# Patient Record
Sex: Male | Born: 1950 | Race: Black or African American | Hispanic: No | Marital: Married | Smoking: Never smoker
Health system: Southern US, Community
[De-identification: ages and names within clinical notes are randomized; demographics above are authoritative.]

## PROBLEM LIST (undated history)

## (undated) DIAGNOSIS — Z860101 Personal history of adenomatous and serrated colon polyps: Secondary | ICD-10-CM

## (undated) DIAGNOSIS — M1990 Unspecified osteoarthritis, unspecified site: Secondary | ICD-10-CM

## (undated) DIAGNOSIS — R0609 Other forms of dyspnea: Secondary | ICD-10-CM

## (undated) DIAGNOSIS — G4730 Sleep apnea, unspecified: Secondary | ICD-10-CM

## (undated) DIAGNOSIS — R42 Dizziness and giddiness: Secondary | ICD-10-CM

## (undated) DIAGNOSIS — Z9109 Other allergy status, other than to drugs and biological substances: Secondary | ICD-10-CM

## (undated) DIAGNOSIS — I10 Essential (primary) hypertension: Secondary | ICD-10-CM

## (undated) DIAGNOSIS — E785 Hyperlipidemia, unspecified: Secondary | ICD-10-CM

## (undated) DIAGNOSIS — Z973 Presence of spectacles and contact lenses: Secondary | ICD-10-CM

## (undated) DIAGNOSIS — N401 Enlarged prostate with lower urinary tract symptoms: Secondary | ICD-10-CM

## (undated) DIAGNOSIS — T148XXA Other injury of unspecified body region, initial encounter: Secondary | ICD-10-CM

## (undated) DIAGNOSIS — K219 Gastro-esophageal reflux disease without esophagitis: Secondary | ICD-10-CM

## (undated) DIAGNOSIS — G709 Myoneural disorder, unspecified: Secondary | ICD-10-CM

## (undated) DIAGNOSIS — R0602 Shortness of breath: Secondary | ICD-10-CM

## (undated) DIAGNOSIS — R81 Glycosuria: Secondary | ICD-10-CM

## (undated) DIAGNOSIS — R51 Headache: Secondary | ICD-10-CM

## (undated) DIAGNOSIS — N529 Male erectile dysfunction, unspecified: Secondary | ICD-10-CM

## (undated) DIAGNOSIS — Z8669 Personal history of other diseases of the nervous system and sense organs: Secondary | ICD-10-CM

## (undated) DIAGNOSIS — G4733 Obstructive sleep apnea (adult) (pediatric): Secondary | ICD-10-CM

## (undated) DIAGNOSIS — E119 Type 2 diabetes mellitus without complications: Secondary | ICD-10-CM

## (undated) DIAGNOSIS — G589 Mononeuropathy, unspecified: Secondary | ICD-10-CM

## (undated) DIAGNOSIS — Z7901 Long term (current) use of anticoagulants: Secondary | ICD-10-CM

## (undated) DIAGNOSIS — I2510 Atherosclerotic heart disease of native coronary artery without angina pectoris: Secondary | ICD-10-CM

## (undated) HISTORY — PX: APPENDECTOMY: SHX54

## (undated) HISTORY — PX: TONSILLECTOMY: SUR1361

## (undated) HISTORY — PX: ULNAR NERVE TRANSPOSITION: SHX2595

## (undated) HISTORY — PX: INGUINAL HERNIA REPAIR: SUR1180

## (undated) HISTORY — PX: KNEE ARTHROSCOPY: SUR90

## (undated) HISTORY — PX: CARDIAC CATHETERIZATION: SHX172

## (undated) HISTORY — PX: CARPAL TUNNEL RELEASE: SHX101

## (undated) HISTORY — PX: ELBOW ARTHROSCOPY: SUR87

## (undated) HISTORY — PX: SHOULDER ARTHROSCOPY: SHX128

## (undated) HISTORY — PX: COLONOSCOPY W/ POLYPECTOMY: SHX1380

---
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
## 1993-04-16 HISTORY — PX: CORONARY ANGIOPLASTY WITH STENT PLACEMENT: SHX49

## 1994-04-16 HISTORY — PX: CORONARY ANGIOPLASTY WITH STENT PLACEMENT: SHX49

## 1998-05-19 ENCOUNTER — Ambulatory Visit (HOSPITAL_COMMUNITY): Admission: RE | Admit: 1998-05-19 | Discharge: 1998-05-19 | Payer: Self-pay | Admitting: *Deleted

## 1998-05-19 ENCOUNTER — Encounter: Payer: Self-pay | Admitting: *Deleted

## 2000-04-16 HISTORY — PX: ULNAR NERVE TRANSPOSITION: SHX2595

## 2002-12-23 ENCOUNTER — Ambulatory Visit (HOSPITAL_COMMUNITY): Admission: RE | Admit: 2002-12-23 | Discharge: 2002-12-23 | Payer: Self-pay | Admitting: Orthopedic Surgery

## 2002-12-23 ENCOUNTER — Encounter: Payer: Self-pay | Admitting: Orthopedic Surgery

## 2003-02-15 ENCOUNTER — Ambulatory Visit (HOSPITAL_COMMUNITY): Admission: RE | Admit: 2003-02-15 | Discharge: 2003-02-15 | Payer: Self-pay | Admitting: Orthopedic Surgery

## 2003-04-17 HISTORY — PX: CORONARY ANGIOPLASTY WITH STENT PLACEMENT: SHX49

## 2003-05-27 ENCOUNTER — Inpatient Hospital Stay (HOSPITAL_COMMUNITY): Admission: AD | Admit: 2003-05-27 | Discharge: 2003-05-29 | Payer: Self-pay | Admitting: *Deleted

## 2004-05-12 ENCOUNTER — Ambulatory Visit: Payer: Self-pay | Admitting: Family Medicine

## 2004-10-05 ENCOUNTER — Ambulatory Visit: Payer: Self-pay | Admitting: Family Medicine

## 2005-01-04 ENCOUNTER — Ambulatory Visit: Payer: Self-pay | Admitting: Family Medicine

## 2005-02-01 ENCOUNTER — Ambulatory Visit: Payer: Self-pay | Admitting: Family Medicine

## 2005-06-04 ENCOUNTER — Ambulatory Visit: Payer: Self-pay | Admitting: Family Medicine

## 2013-03-26 DIAGNOSIS — M1711 Unilateral primary osteoarthritis, right knee: Secondary | ICD-10-CM

## 2013-03-26 HISTORY — DX: Unilateral primary osteoarthritis, right knee: M17.11

## 2013-04-27 ENCOUNTER — Encounter (HOSPITAL_COMMUNITY): Payer: Self-pay | Admitting: Pharmacy Technician

## 2013-04-27 ENCOUNTER — Other Ambulatory Visit: Payer: Self-pay | Admitting: Orthopedic Surgery

## 2013-04-30 NOTE — Pre-Procedure Instructions (Signed)
Clarence Schneider  04/30/2013   Your procedure is scheduled on:  Monday May 11, 2013 at 10:00 AM.  Report to [facility] Short Stay Entrance "A" Admitting at 7:00 AM.  Call this number if you have problems the morning of surgery: [phone]   Remember:   Do not eat food or drink liquids after midnight.   Take these medicines the morning of surgery with A SIP OF WATER: Amlodipine (Norvasc), Carvedilol (Coreg), Gabapentin (Neurontin)   Do not wear jewelry.  Do not wear lotions, powders, or colognes. You may wear deodorant.  Men may shave face and neck.  Do not bring valuables to the hospital.  [facility] is not responsible for any belongings or valuables.               Contacts, dentures or bridgework may not be worn into surgery.  Leave suitcase in the car. After surgery it may be brought to your room.  For patients admitted to the hospital, discharge time is determined by your teatment team.               Patients discharged the day of surgery will not be allowed to drive home.  Name and phone number of your driver: Family/Friend  Special Instructions: Shower using CHG soap the night before your surgery and the morning of your surgery   Please read over the following fact sheets that you were given: Pain Booklet, Coughing and Deep Breathing, Blood Transfusion Information, Total Joint Packet, MRSA Information and Surgical Site Infection Prevention

## 2013-05-01 ENCOUNTER — Encounter (HOSPITAL_COMMUNITY): Payer: Self-pay

## 2013-05-01 ENCOUNTER — Encounter (HOSPITAL_COMMUNITY)
Admission: RE | Admit: 2013-05-01 | Discharge: 2013-05-01 | Disposition: A | Discharge status: Home / Self Care | Payer: BC Managed Care – PPO | Source: Ambulatory Visit | Attending: Orthopedic Surgery | Admitting: Orthopedic Surgery

## 2013-05-01 DIAGNOSIS — Z01812 Encounter for preprocedural laboratory examination: Secondary | ICD-10-CM | POA: Insufficient documentation

## 2013-05-01 DIAGNOSIS — Z01818 Encounter for other preprocedural examination: Secondary | ICD-10-CM | POA: Insufficient documentation

## 2013-05-01 DIAGNOSIS — Z01810 Encounter for preprocedural cardiovascular examination: Secondary | ICD-10-CM | POA: Insufficient documentation

## 2013-05-01 HISTORY — DX: Shortness of breath: R06.02

## 2013-05-01 HISTORY — DX: Dizziness and giddiness: R42

## 2013-05-01 HISTORY — DX: Headache: R51

## 2013-05-01 HISTORY — DX: Mononeuropathy, unspecified: G58.9

## 2013-05-01 HISTORY — DX: Gastro-esophageal reflux disease without esophagitis: K21.9

## 2013-05-01 HISTORY — DX: Essential (primary) hypertension: I10

## 2013-05-01 HISTORY — DX: Myoneural disorder, unspecified: G70.9

## 2013-05-01 HISTORY — DX: Other injury of unspecified body region, initial encounter: T14.8XXA

## 2013-05-01 HISTORY — DX: Unspecified osteoarthritis, unspecified site: M19.90

## 2013-05-01 HISTORY — DX: Atherosclerotic heart disease of native coronary artery without angina pectoris: I25.10

## 2013-05-01 HISTORY — DX: Hyperlipidemia, unspecified: E78.5

## 2013-05-01 LAB — CBC WITH DIFFERENTIAL/PLATELET
Basophils Absolute: 0 10*3/uL (ref 0.0–0.1)
Basophils Relative: 1 % (ref 0–1)
EOS PCT: 2 % (ref 0–5)
Eosinophils Absolute: 0.1 10*3/uL (ref 0.0–0.7)
HEMATOCRIT: 53.2 % — AB (ref 39.0–52.0)
Hemoglobin: 18.3 g/dL — ABNORMAL HIGH (ref 13.0–17.0)
LYMPHS ABS: 1.5 10*3/uL (ref 0.7–4.0)
Lymphocytes Relative: 25 % (ref 12–46)
MCH: 31.3 pg (ref 26.0–34.0)
MCHC: 34.4 g/dL (ref 30.0–36.0)
MCV: 90.9 fL (ref 78.0–100.0)
MONO ABS: 0.6 10*3/uL (ref 0.1–1.0)
Monocytes Relative: 9 % (ref 3–12)
Neutro Abs: 3.9 10*3/uL (ref 1.7–7.7)
Neutrophils Relative %: 64 % (ref 43–77)
Platelets: 148 10*3/uL — ABNORMAL LOW (ref 150–400)
RBC: 5.85 MIL/uL — AB (ref 4.22–5.81)
RDW: 14.3 % (ref 11.5–15.5)
WBC: 6.2 10*3/uL (ref 4.0–10.5)

## 2013-05-01 LAB — COMPREHENSIVE METABOLIC PANEL
ALT: 53 U/L (ref 0–53)
AST: 38 U/L — ABNORMAL HIGH (ref 0–37)
Albumin: 3.6 g/dL (ref 3.5–5.2)
Alkaline Phosphatase: 80 U/L (ref 39–117)
BUN: 12 mg/dL (ref 6–23)
CALCIUM: 8.8 mg/dL (ref 8.4–10.5)
CO2: 25 meq/L (ref 19–32)
CREATININE: 1.14 mg/dL (ref 0.50–1.35)
Chloride: 104 mEq/L (ref 96–112)
GFR, EST AFRICAN AMERICAN: 78 mL/min — AB (ref >90)
GFR, EST NON AFRICAN AMERICAN: 68 mL/min — AB (ref >90)
Glucose, Bld: 89 mg/dL (ref 70–99)
Potassium: 4.9 mEq/L (ref 3.7–5.3)
SODIUM: 141 meq/L (ref 137–147)
Total Bilirubin: 0.4 mg/dL (ref 0.3–1.2)
Total Protein: 6.8 g/dL (ref 6.0–8.3)

## 2013-05-01 LAB — URINALYSIS, ROUTINE W REFLEX MICROSCOPIC
Bilirubin Urine: NEGATIVE
GLUCOSE, UA: NEGATIVE mg/dL
HGB URINE DIPSTICK: NEGATIVE
Ketones, ur: NEGATIVE mg/dL
LEUKOCYTES UA: NEGATIVE
Nitrite: NEGATIVE
PROTEIN: NEGATIVE mg/dL
Specific Gravity, Urine: 1.043 — ABNORMAL HIGH (ref 1.005–1.030)
Urobilinogen, UA: 0.2 mg/dL (ref 0.0–1.0)
pH: 5 (ref 5.0–8.0)

## 2013-05-01 LAB — SURGICAL PCR SCREEN
MRSA, PCR: NEGATIVE
Staphylococcus aureus: NEGATIVE

## 2013-05-01 LAB — TYPE AND SCREEN
ABO/RH(D): B POS
ANTIBODY SCREEN: NEGATIVE

## 2013-05-01 LAB — APTT: aPTT: 26 seconds (ref 24–37)

## 2013-05-01 LAB — PROTIME-INR
INR: 0.98 (ref 0.00–1.49)
Prothrombin Time: 12.8 seconds (ref 11.6–15.2)

## 2013-05-01 LAB — ABO/RH: ABO/RH(D): B POS

## 2013-05-01 NOTE — Progress Notes (Signed)
Patient informed Nurse that he had a stress test at [facility] in [place] last year. Will request records. Patient also had a cardiac cath once in 2005 where he received two stents and once in 2006 [facility]) where he had one stent placed. Patient denied any recent chest discomfort. PCP is Dr. Teressa Lower at [facility]. Patient has sleep apnea and is supposed to wear a CPAP machine at bedtime however due to sinusitis he has not been wearing his CPAP machine. Patient sounded congested during PAT appointment but he denied having a cold and stated "this is how I sound, I'm just stopped up." Patient denied having any shortness of breath. When asked about Plavix patient informed Nurse that he was told to stop his Plavix and aspirin on 05/04/13. Lastly, patient informed Nurse that he had some syncopal episodes in the past and stated he had to wear a heart monitor about 3 months ago, and that his doctor decreased the dosage on his Amlodipine to 2.5mg  daily. He reported being lightheaded about 3 days ago, but denied actually "passing out."  [place], [place] notified of patients history. Will request records and send chart to [facility] for review.

## 2013-05-02 LAB — URINE CULTURE: Colony Count: 10000

## 2013-05-04 ENCOUNTER — Encounter (HOSPITAL_COMMUNITY): Payer: Self-pay

## 2013-05-04 NOTE — Progress Notes (Addendum)
Anesthesia Chart Review:  Patient is a 63 year old male scheduled for left TKA on 05/11/13 by Dr. Ronnie Derby.    History includes non-smoker, CAD s/p RCA stent 05/2003 [facility] by noted) and on 04/2004 [facility]), HTN, HLD, occasional lightheadedness, chronic DOE, neuromuscular disorder (not specified, but on Neurontin). PCP is Dr. Teressa Lower who is aware of plans for surgery. Cardiologist is Dr. Ninfa Linden [facility]) who cleared patient for this procedure.  Nuclear stress test on 02/19/13 showed no evidence of ischemia on this study, no diagnostic EKG changes of ischemia, EF 65%.  EKG on 05/01/13 showed NSR, non-specific ST/T wave abnormality.  EKG on 12/18/12 [facility]) showed SR, anterolateral ST elevation--repolarization variant, non-specific T wave abnormality.   Event monitor on 02/04/13 [facility]) showed frequent isolated PVC's, rare PAC's, no pauses or bradycardia.  There was no echo done within the past three years at [facility], [facility], [facility], or [place].  His last cardiac cath was on 04/28/04.  A Taxus stent was placed to the mid RCA for 70% stenosis.  There was 20% proximal RCA stenosis and 15% proximal DIAG1 stenosis. EF 50-55%.     CXR on 05/01/13 showed no active cardiopulmonary disease.  Preoperative labs noted. H/H 18.3/53.2 (previously 17.1/53.4 on 10/22/12) PLT 148K.  Comparison labs requested from PCP. Urine culture showed 10,000 colonies, none predominant.    He has been cleared by his cardiologist.  His PCP is also aware of plans for surgery.  If no acute changes then I would anticipate that he could proceed as planned.  George Hugh [facility] Short Stay Center/Anesthesiology Phone [phone] 05/05/2013 10:41 AM

## 2013-05-10 MED ORDER — CHLORHEXIDINE GLUCONATE 4 % EX LIQD: 60.0000 mL | Freq: Once | CUTANEOUS | Status: DC

## 2013-05-10 MED ORDER — CEFAZOLIN SODIUM-DEXTROSE 2-3 GM-% IV SOLR
2.0000 g | INTRAVENOUS | Status: AC
  Administered 2013-05-11: 2 g via INTRAVENOUS
  Filled 2013-05-10: qty 50

## 2013-05-10 MED ORDER — BUPIVACAINE LIPOSOME 1.3 % IJ SUSP
20.0000 mL | Freq: Once | INTRAMUSCULAR | Status: DC
  Filled 2013-05-10 (×2): qty 20

## 2013-05-10 MED ORDER — SODIUM CHLORIDE 0.9 % IV SOLN
1000.0000 mg | INTRAVENOUS | Status: DC
  Filled 2013-05-10: qty 10

## 2013-05-11 ENCOUNTER — Inpatient Hospital Stay (HOSPITAL_COMMUNITY)
Admission: RE | Admit: 2013-05-11 | Discharge: 2013-05-12 | DRG: 470 | Disposition: A | Discharge status: Home Health | Payer: BC Managed Care – PPO | Source: Ambulatory Visit | Attending: Orthopedic Surgery | Admitting: Orthopedic Surgery

## 2013-05-11 ENCOUNTER — Inpatient Hospital Stay (HOSPITAL_COMMUNITY): Payer: BC Managed Care – PPO | Admitting: Certified Registered"

## 2013-05-11 ENCOUNTER — Encounter (HOSPITAL_COMMUNITY): Payer: Self-pay | Admitting: *Deleted

## 2013-05-11 ENCOUNTER — Encounter (HOSPITAL_COMMUNITY)
Admission: RE | Disposition: A | Discharge status: Home Health | Payer: Self-pay | Source: Ambulatory Visit | Attending: Orthopedic Surgery

## 2013-05-11 ENCOUNTER — Encounter (HOSPITAL_COMMUNITY): Payer: BC Managed Care – PPO | Admitting: Vascular Surgery

## 2013-05-11 DIAGNOSIS — I2510 Atherosclerotic heart disease of native coronary artery without angina pectoris: Secondary | ICD-10-CM | POA: Diagnosis present

## 2013-05-11 DIAGNOSIS — E785 Hyperlipidemia, unspecified: Secondary | ICD-10-CM | POA: Diagnosis present

## 2013-05-11 DIAGNOSIS — Z9861 Coronary angioplasty status: Secondary | ICD-10-CM

## 2013-05-11 DIAGNOSIS — Z9089 Acquired absence of other organs: Secondary | ICD-10-CM

## 2013-05-11 DIAGNOSIS — D62 Acute posthemorrhagic anemia: Secondary | ICD-10-CM | POA: Diagnosis not present

## 2013-05-11 DIAGNOSIS — Z888 Allergy status to other drugs, medicaments and biological substances status: Secondary | ICD-10-CM

## 2013-05-11 DIAGNOSIS — M1710 Unilateral primary osteoarthritis, unspecified knee: Principal | ICD-10-CM | POA: Diagnosis present

## 2013-05-11 DIAGNOSIS — Z96659 Presence of unspecified artificial knee joint: Secondary | ICD-10-CM

## 2013-05-11 DIAGNOSIS — K219 Gastro-esophageal reflux disease without esophagitis: Secondary | ICD-10-CM | POA: Diagnosis present

## 2013-05-11 DIAGNOSIS — I10 Essential (primary) hypertension: Secondary | ICD-10-CM | POA: Diagnosis present

## 2013-05-11 HISTORY — DX: Presence of unspecified artificial knee joint: Z96.659

## 2013-05-11 HISTORY — PX: TOTAL KNEE ARTHROPLASTY: SHX125

## 2013-05-11 SURGERY — ARTHROPLASTY, KNEE, TOTAL
Anesthesia: Regional | Site: Knee | Laterality: Left

## 2013-05-11 MED ORDER — CEFAZOLIN SODIUM 1-5 GM-% IV SOLN
1.0000 g | Freq: Four times a day (QID) | INTRAVENOUS | Status: AC
  Administered 2013-05-11 (×2): 1 g via INTRAVENOUS
  Filled 2013-05-11 (×3): qty 50

## 2013-05-11 MED ORDER — DIPHENHYDRAMINE HCL 50 MG/ML IJ SOLN
INTRAMUSCULAR | Status: AC
  Filled 2013-05-11: qty 1

## 2013-05-11 MED ORDER — ARTIFICIAL TEARS OP OINT
TOPICAL_OINTMENT | OPHTHALMIC | Status: DC | PRN
  Administered 2013-05-11: 1 via OPHTHALMIC

## 2013-05-11 MED ORDER — PHENOL 1.4 % MT LIQD: 1.0000 | OROMUCOSAL | Status: DC | PRN

## 2013-05-11 MED ORDER — FENTANYL CITRATE 0.05 MG/ML IJ SOLN
25.0000 ug | INTRAMUSCULAR | Status: DC | PRN
Start: 2013-05-11 — End: 2013-05-11
  Administered 2013-05-11: 25 ug via INTRAVENOUS
  Administered 2013-05-11: 50 ug via INTRAVENOUS
  Administered 2013-05-11: 25 ug via INTRAVENOUS
  Administered 2013-05-11: 50 ug via INTRAVENOUS

## 2013-05-11 MED ORDER — MENTHOL 3 MG MT LOZG: 1.0000 | LOZENGE | OROMUCOSAL | Status: DC | PRN

## 2013-05-11 MED ORDER — HYDROCODONE-ACETAMINOPHEN 10-325 MG PO TABS
1.0000 | ORAL_TABLET | ORAL | Status: DC | PRN
  Administered 2013-05-11: 2 via ORAL
  Administered 2013-05-11: 1 via ORAL
  Administered 2013-05-12: 2 via ORAL
  Administered 2013-05-12 (×2): 1 via ORAL
  Filled 2013-05-11: qty 2
  Filled 2013-05-11: qty 1
  Filled 2013-05-11: qty 2
  Filled 2013-05-11 (×3): qty 1

## 2013-05-11 MED ORDER — FENTANYL CITRATE 0.05 MG/ML IJ SOLN
INTRAMUSCULAR | Status: AC
  Filled 2013-05-11: qty 2

## 2013-05-11 MED ORDER — ONDANSETRON HCL 4 MG/2ML IJ SOLN
INTRAMUSCULAR | Status: DC | PRN
Start: 2013-05-11 — End: 2013-05-11
  Administered 2013-05-11: 4 mg via INTRAVENOUS

## 2013-05-11 MED ORDER — DIPHENHYDRAMINE HCL 50 MG/ML IJ SOLN
12.5000 mg | Freq: Once | INTRAMUSCULAR | Status: AC
  Administered 2013-05-11: 12.5 mg via INTRAVENOUS

## 2013-05-11 MED ORDER — ONDANSETRON HCL 4 MG PO TABS: 4.0000 mg | ORAL_TABLET | Freq: Four times a day (QID) | ORAL | Status: DC | PRN

## 2013-05-11 MED ORDER — CLOTRIMAZOLE 1 % EX CREA
TOPICAL_CREAM | Freq: Two times a day (BID) | CUTANEOUS | Status: DC
  Filled 2013-05-11: qty 15

## 2013-05-11 MED ORDER — ASPIRIN EC 325 MG PO TBEC
325.0000 mg | DELAYED_RELEASE_TABLET | Freq: Every day | ORAL | Status: DC
  Administered 2013-05-11 – 2013-05-12 (×2): 325 mg via ORAL
  Filled 2013-05-11 (×3): qty 1

## 2013-05-11 MED ORDER — BUPIVACAINE LIPOSOME 1.3 % IJ SUSP
INTRAMUSCULAR | Status: DC | PRN
  Administered 2013-05-11: 20 mL

## 2013-05-11 MED ORDER — ACETAMINOPHEN 325 MG PO TABS
650.0000 mg | ORAL_TABLET | Freq: Four times a day (QID) | ORAL | Status: DC | PRN
Start: 2013-05-11 — End: 2013-05-12

## 2013-05-11 MED ORDER — PROPOFOL 10 MG/ML IV BOLUS
INTRAVENOUS | Status: DC | PRN
  Administered 2013-05-11: 50 mg via INTRAVENOUS
  Administered 2013-05-11: 150 mg via INTRAVENOUS

## 2013-05-11 MED ORDER — GLYCOPYRROLATE 0.2 MG/ML IJ SOLN
INTRAMUSCULAR | Status: DC | PRN
  Administered 2013-05-11: 0.6 mg via INTRAVENOUS

## 2013-05-11 MED ORDER — LIDOCAINE HCL (CARDIAC) 20 MG/ML IV SOLN
INTRAVENOUS | Status: AC
  Filled 2013-05-11: qty 5

## 2013-05-11 MED ORDER — CELECOXIB 200 MG PO CAPS
200.0000 mg | ORAL_CAPSULE | Freq: Two times a day (BID) | ORAL | Status: DC
  Administered 2013-05-11 – 2013-05-12 (×2): 200 mg via ORAL
  Filled 2013-05-11 (×4): qty 1

## 2013-05-11 MED ORDER — MIDAZOLAM HCL 2 MG/2ML IJ SOLN
2.0000 mg | Freq: Once | INTRAMUSCULAR | Status: AC
  Administered 2013-05-11: 2 mg via INTRAVENOUS
  Filled 2013-05-11: qty 2

## 2013-05-11 MED ORDER — PHENYLEPHRINE 40 MCG/ML (10ML) SYRINGE FOR IV PUSH (FOR BLOOD PRESSURE SUPPORT)
PREFILLED_SYRINGE | INTRAVENOUS | Status: AC
  Filled 2013-05-11: qty 10

## 2013-05-11 MED ORDER — ROCURONIUM BROMIDE 50 MG/5ML IV SOLN
INTRAVENOUS | Status: AC
  Filled 2013-05-11: qty 1

## 2013-05-11 MED ORDER — HYDROMORPHONE HCL PF 1 MG/ML IJ SOLN
1.0000 mg | INTRAMUSCULAR | Status: DC | PRN
  Administered 2013-05-11 (×3): 1 mg via INTRAVENOUS
  Filled 2013-05-11 (×2): qty 1

## 2013-05-11 MED ORDER — ONDANSETRON HCL 4 MG/2ML IJ SOLN: 4.0000 mg | Freq: Four times a day (QID) | INTRAMUSCULAR | Status: DC | PRN

## 2013-05-11 MED ORDER — ATORVASTATIN CALCIUM 20 MG PO TABS
20.0000 mg | ORAL_TABLET | Freq: Every day | ORAL | Status: DC
  Administered 2013-05-11: 20 mg via ORAL
  Filled 2013-05-11 (×2): qty 1

## 2013-05-11 MED ORDER — ARTIFICIAL TEARS OP OINT
TOPICAL_OINTMENT | OPHTHALMIC | Status: AC
  Filled 2013-05-11: qty 3.5

## 2013-05-11 MED ORDER — EZETIMIBE 10 MG PO TABS
10.0000 mg | ORAL_TABLET | Freq: Every day | ORAL | Status: DC
  Administered 2013-05-11 – 2013-05-12 (×2): 10 mg via ORAL
  Filled 2013-05-11 (×2): qty 1

## 2013-05-11 MED ORDER — OXYCODONE HCL ER 10 MG PO T12A
10.0000 mg | EXTENDED_RELEASE_TABLET | Freq: Two times a day (BID) | ORAL | Status: DC
  Administered 2013-05-11 – 2013-05-12 (×2): 10 mg via ORAL
  Filled 2013-05-11 (×2): qty 1

## 2013-05-11 MED ORDER — GABAPENTIN 400 MG PO CAPS
800.0000 mg | ORAL_CAPSULE | Freq: Two times a day (BID) | ORAL | Status: DC
Start: 2013-05-11 — End: 2013-05-12
  Administered 2013-05-11 – 2013-05-12 (×2): 800 mg via ORAL
  Filled 2013-05-11 (×3): qty 2

## 2013-05-11 MED ORDER — SODIUM CHLORIDE 0.9 % IR SOLN
Status: DC | PRN
  Administered 2013-05-11: 1000 mL

## 2013-05-11 MED ORDER — FLEET ENEMA 7-19 GM/118ML RE ENEM: 1.0000 | ENEMA | Freq: Once | RECTAL | Status: AC | PRN

## 2013-05-11 MED ORDER — HYDROMORPHONE HCL PF 1 MG/ML IJ SOLN
INTRAMUSCULAR | Status: AC
  Filled 2013-05-11: qty 1

## 2013-05-11 MED ORDER — METHOCARBAMOL 500 MG PO TABS
500.0000 mg | ORAL_TABLET | Freq: Four times a day (QID) | ORAL | Status: DC | PRN
  Administered 2013-05-11 – 2013-05-12 (×2): 500 mg via ORAL
  Filled 2013-05-11 (×2): qty 1

## 2013-05-11 MED ORDER — PHENYLEPHRINE HCL 10 MG/ML IJ SOLN
INTRAMUSCULAR | Status: DC | PRN
  Administered 2013-05-11: 80 ug via INTRAVENOUS
  Administered 2013-05-11: 40 ug via INTRAVENOUS
  Administered 2013-05-11 (×3): 80 ug via INTRAVENOUS

## 2013-05-11 MED ORDER — BUPIVACAINE-EPINEPHRINE (PF) 0.5% -1:200000 IJ SOLN
INTRAMUSCULAR | Status: AC
  Filled 2013-05-11: qty 10

## 2013-05-11 MED ORDER — MIDAZOLAM HCL 2 MG/2ML IJ SOLN
INTRAMUSCULAR | Status: AC
  Filled 2013-05-11: qty 2

## 2013-05-11 MED ORDER — AMLODIPINE BESYLATE 2.5 MG PO TABS
2.5000 mg | ORAL_TABLET | Freq: Every day | ORAL | Status: DC
Start: 2013-05-12 — End: 2013-05-12
  Administered 2013-05-12: 2.5 mg via ORAL
  Filled 2013-05-11: qty 1

## 2013-05-11 MED ORDER — FENTANYL CITRATE 0.05 MG/ML IJ SOLN
100.0000 ug | Freq: Once | INTRAMUSCULAR | Status: AC
  Administered 2013-05-11: 100 ug via INTRAVENOUS
  Filled 2013-05-11: qty 2

## 2013-05-11 MED ORDER — LIDOCAINE HCL (CARDIAC) 20 MG/ML IV SOLN
INTRAVENOUS | Status: DC | PRN
  Administered 2013-05-11: 30 mg via INTRAVENOUS

## 2013-05-11 MED ORDER — SUCCINYLCHOLINE CHLORIDE 20 MG/ML IJ SOLN
INTRAMUSCULAR | Status: DC | PRN
  Administered 2013-05-11: 30 mg via INTRAVENOUS

## 2013-05-11 MED ORDER — ZOLPIDEM TARTRATE 5 MG PO TABS: 5.0000 mg | ORAL_TABLET | Freq: Every evening | ORAL | Status: DC | PRN

## 2013-05-11 MED ORDER — GLYCOPYRROLATE 0.2 MG/ML IJ SOLN
INTRAMUSCULAR | Status: AC
  Filled 2013-05-11: qty 3

## 2013-05-11 MED ORDER — CLOPIDOGREL BISULFATE 75 MG PO TABS
75.0000 mg | ORAL_TABLET | Freq: Every day | ORAL | Status: DC
  Administered 2013-05-12: 75 mg via ORAL
  Filled 2013-05-11 (×2): qty 1

## 2013-05-11 MED ORDER — SENNOSIDES-DOCUSATE SODIUM 8.6-50 MG PO TABS: 1.0000 | ORAL_TABLET | Freq: Every evening | ORAL | Status: DC | PRN

## 2013-05-11 MED ORDER — ONDANSETRON HCL 4 MG/2ML IJ SOLN
INTRAMUSCULAR | Status: AC
  Filled 2013-05-11: qty 2

## 2013-05-11 MED ORDER — BISACODYL 5 MG PO TBEC: 5.0000 mg | DELAYED_RELEASE_TABLET | Freq: Every day | ORAL | Status: DC | PRN

## 2013-05-11 MED ORDER — METHOCARBAMOL 100 MG/ML IJ SOLN
500.0000 mg | Freq: Four times a day (QID) | INTRAVENOUS | Status: DC | PRN
  Filled 2013-05-11: qty 5

## 2013-05-11 MED ORDER — FENTANYL CITRATE 0.05 MG/ML IJ SOLN
INTRAMUSCULAR | Status: DC | PRN
  Administered 2013-05-11: 50 ug via INTRAVENOUS
  Administered 2013-05-11: 100 ug via INTRAVENOUS

## 2013-05-11 MED ORDER — LACTATED RINGERS IV SOLN
INTRAVENOUS | Status: DC | PRN
  Administered 2013-05-11 (×2): via INTRAVENOUS

## 2013-05-11 MED ORDER — DIPHENHYDRAMINE HCL 12.5 MG/5ML PO ELIX
12.5000 mg | ORAL_SOLUTION | ORAL | Status: DC | PRN
  Administered 2013-05-11 – 2013-05-12 (×2): 25 mg via ORAL
  Filled 2013-05-11 (×2): qty 10

## 2013-05-11 MED ORDER — ALUM & MAG HYDROXIDE-SIMETH 200-200-20 MG/5ML PO SUSP: 30.0000 mL | ORAL | Status: DC | PRN

## 2013-05-11 MED ORDER — LACTATED RINGERS IV SOLN
INTRAVENOUS | Status: DC
  Administered 2013-05-11: 08:00:00 via INTRAVENOUS

## 2013-05-11 MED ORDER — CARVEDILOL 12.5 MG PO TABS
12.5000 mg | ORAL_TABLET | Freq: Two times a day (BID) | ORAL | Status: DC
  Administered 2013-05-12 (×2): 12.5 mg via ORAL
  Filled 2013-05-11 (×4): qty 1

## 2013-05-11 MED ORDER — COLESEVELAM HCL 625 MG PO TABS
1250.0000 mg | ORAL_TABLET | Freq: Two times a day (BID) | ORAL | Status: DC
  Administered 2013-05-12 (×2): 1250 mg via ORAL
  Filled 2013-05-11 (×3): qty 2

## 2013-05-11 MED ORDER — ACETAMINOPHEN 650 MG RE SUPP
650.0000 mg | Freq: Four times a day (QID) | RECTAL | Status: DC | PRN
Start: 2013-05-11 — End: 2013-05-12

## 2013-05-11 MED ORDER — LOSARTAN POTASSIUM 50 MG PO TABS
100.0000 mg | ORAL_TABLET | Freq: Every day | ORAL | Status: DC
  Administered 2013-05-11 – 2013-05-12 (×2): 100 mg via ORAL
  Filled 2013-05-11 (×2): qty 2

## 2013-05-11 MED ORDER — TESTOSTERONE CYPIONATE 200 MG/ML IM SOLN: 200.0000 mg | INTRAMUSCULAR | Status: DC

## 2013-05-11 MED ORDER — ROCURONIUM BROMIDE 100 MG/10ML IV SOLN
INTRAVENOUS | Status: DC | PRN
  Administered 2013-05-11: 40 mg via INTRAVENOUS

## 2013-05-11 MED ORDER — FENTANYL CITRATE 0.05 MG/ML IJ SOLN
INTRAMUSCULAR | Status: AC
  Filled 2013-05-11: qty 5

## 2013-05-11 MED ORDER — SODIUM CHLORIDE 0.9 % IV SOLN
INTRAVENOUS | Status: DC
  Administered 2013-05-12: 75 mL/h via INTRAVENOUS

## 2013-05-11 MED ORDER — EPHEDRINE SULFATE 50 MG/ML IJ SOLN
INTRAMUSCULAR | Status: DC | PRN
  Administered 2013-05-11 (×4): 5 mg via INTRAVENOUS

## 2013-05-11 MED ORDER — NEOSTIGMINE METHYLSULFATE 1 MG/ML IJ SOLN
INTRAMUSCULAR | Status: DC | PRN
  Administered 2013-05-11: 5 mg via INTRAVENOUS

## 2013-05-11 MED ORDER — BUPIVACAINE-EPINEPHRINE 0.5% -1:200000 IJ SOLN
INTRAMUSCULAR | Status: DC | PRN
  Administered 2013-05-11: 30 mL

## 2013-05-11 MED ORDER — FENTANYL CITRATE 0.05 MG/ML IJ SOLN
INTRAMUSCULAR | Status: AC
Start: 2013-05-11 — End: 2013-05-12
  Filled 2013-05-11: qty 2

## 2013-05-11 MED ORDER — DOCUSATE SODIUM 100 MG PO CAPS
100.0000 mg | ORAL_CAPSULE | Freq: Two times a day (BID) | ORAL | Status: DC
Start: 2013-05-11 — End: 2013-05-12
  Administered 2013-05-11 – 2013-05-12 (×2): 100 mg via ORAL
  Filled 2013-05-11 (×4): qty 1

## 2013-05-11 MED ORDER — PROPOFOL 10 MG/ML IV BOLUS
INTRAVENOUS | Status: AC
  Filled 2013-05-11: qty 20

## 2013-05-11 SURGICAL SUPPLY — 55 items
BANDAGE ESMARK 6X9 LF (GAUZE/BANDAGES/DRESSINGS) ×1 IMPLANT
BLADE SAGITTAL 13X1.27X60 (BLADE) ×2 IMPLANT
BLADE SAGITTAL 13X1.27X60MM (BLADE) ×1
BLADE SAW SGTL 83.5X18.5 (BLADE) ×3 IMPLANT
BNDG CMPR 9X6 STRL LF SNTH (GAUZE/BANDAGES/DRESSINGS) ×1
BNDG ESMARK 6X9 LF (GAUZE/BANDAGES/DRESSINGS) ×3
BOWL SMART MIX CTS (DISPOSABLE) ×3 IMPLANT
CAP POR TM CP VIT E LN CER HD ×2 IMPLANT
CEMENT BONE SIMPLEX SPEEDSET (Cement) ×6 IMPLANT
COVER SURGICAL LIGHT HANDLE (MISCELLANEOUS) ×3 IMPLANT
CUFF TOURNIQUET SINGLE 34IN LL (TOURNIQUET CUFF) ×3 IMPLANT
DRAPE EXTREMITY T 121X128X90 (DRAPE) ×3 IMPLANT
DRAPE INCISE IOBAN 66X45 STRL (DRAPES) ×6 IMPLANT
DRAPE PROXIMA HALF (DRAPES) ×3 IMPLANT
DRAPE U-SHAPE 47X51 STRL (DRAPES) ×3 IMPLANT
DRSG ADAPTIC 3X8 NADH LF (GAUZE/BANDAGES/DRESSINGS) ×3 IMPLANT
DRSG PAD ABDOMINAL 8X10 ST (GAUZE/BANDAGES/DRESSINGS) ×3 IMPLANT
DURAPREP 26ML APPLICATOR (WOUND CARE) ×6 IMPLANT
ELECT REM PT RETURN 9FT ADLT (ELECTROSURGICAL) ×3
ELECTRODE REM PT RTRN 9FT ADLT (ELECTROSURGICAL) ×1 IMPLANT
EVACUATOR 1/8 PVC DRAIN (DRAIN) ×3 IMPLANT
GLOVE BIOGEL M 7.0 STRL (GLOVE) IMPLANT
GLOVE BIOGEL PI IND STRL 7.5 (GLOVE) IMPLANT
GLOVE BIOGEL PI IND STRL 8.5 (GLOVE) ×2 IMPLANT
GLOVE BIOGEL PI INDICATOR 7.5 (GLOVE)
GLOVE BIOGEL PI INDICATOR 8.5 (GLOVE) ×4
GLOVE SURG ORTHO 8.0 STRL STRW (GLOVE) ×6 IMPLANT
GOWN PREVENTION PLUS XLARGE (GOWN DISPOSABLE) ×6 IMPLANT
GOWN STRL NON-REIN LRG LVL3 (GOWN DISPOSABLE) ×6 IMPLANT
HANDPIECE INTERPULSE COAX TIP (DISPOSABLE) ×3
HOOD PEEL AWAY FACE SHEILD DIS (HOOD) ×12 IMPLANT
KIT BASIN OR (CUSTOM PROCEDURE TRAY) ×3 IMPLANT
KIT ROOM TURNOVER OR (KITS) ×3 IMPLANT
MANIFOLD NEPTUNE II (INSTRUMENTS) ×3 IMPLANT
NEEDLE 22X1 1/2 (OR ONLY) (NEEDLE) ×3 IMPLANT
NS IRRIG 1000ML POUR BTL (IV SOLUTION) ×3 IMPLANT
PACK TOTAL JOINT (CUSTOM PROCEDURE TRAY) ×3 IMPLANT
PAD ARMBOARD 7.5X6 YLW CONV (MISCELLANEOUS) ×6 IMPLANT
PADDING CAST COTTON 6X4 STRL (CAST SUPPLIES) ×3 IMPLANT
SET HNDPC FAN SPRY TIP SCT (DISPOSABLE) ×1 IMPLANT
SPONGE GAUZE 4X4 12PLY (GAUZE/BANDAGES/DRESSINGS) ×3 IMPLANT
SPONGE GAUZE 4X4 12PLY STER LF (GAUZE/BANDAGES/DRESSINGS) ×2 IMPLANT
STAPLER VISISTAT 35W (STAPLE) ×3 IMPLANT
SUCTION FRAZIER TIP 10 FR DISP (SUCTIONS) ×3 IMPLANT
SUT BONE WAX W31G (SUTURE) ×3 IMPLANT
SUT VIC AB 0 CTB1 27 (SUTURE) ×6 IMPLANT
SUT VIC AB 1 CT1 27 (SUTURE) ×6
SUT VIC AB 1 CT1 27XBRD ANBCTR (SUTURE) ×2 IMPLANT
SUT VIC AB 2-0 CT1 27 (SUTURE) ×6
SUT VIC AB 2-0 CT1 TAPERPNT 27 (SUTURE) ×2 IMPLANT
SYR CONTROL 10ML LL (SYRINGE) ×3 IMPLANT
TOWEL OR 17X24 6PK STRL BLUE (TOWEL DISPOSABLE) ×3 IMPLANT
TOWEL OR 17X26 10 PK STRL BLUE (TOWEL DISPOSABLE) ×3 IMPLANT
TRAY FOLEY CATH 14FR (SET/KITS/TRAYS/PACK) ×3 IMPLANT
WATER STERILE IRR 1000ML POUR (IV SOLUTION) ×6 IMPLANT

## 2013-05-11 NOTE — Transfer of Care (Signed)
Immediate Anesthesia Transfer of Care Note  Patient: Clarence Schneider  Procedure(s) Performed: Procedure(s): LEFT TOTAL KNEE ARTHROPLASTY (Left)  Patient Location: PACU  Anesthesia Type:General  Level of Consciousness: awake, alert  and oriented  Airway & Oxygen Therapy: Patient Spontanous Breathing and Patient connected to face mask oxygen  Post-op Assessment: Report given to PACU RN, Post -op Vital signs reviewed and stable and Patient moving all extremities X 4  Post vital signs: Reviewed and stable  Complications: No apparent anesthesia complications

## 2013-05-11 NOTE — Progress Notes (Signed)
Placed patient on CPAP per home settings at 6cm with oxygen set at 2lpm with Sp02 at this time of 97%.Will continue to monitor patient

## 2013-05-11 NOTE — Anesthesia Postprocedure Evaluation (Signed)
  Anesthesia Post-op Note  Patient: Clarence Schneider  Procedure(s) Performed: Procedure(s): LEFT TOTAL KNEE ARTHROPLASTY (Left)  Patient Location: PACU  Anesthesia Type:General and Regional  Level of Consciousness: awake, alert  and oriented  Airway and Oxygen Therapy: Patient Spontanous Breathing and Patient connected to nasal cannula oxygen  Post-op Pain: mild  Post-op Assessment: Post-op Vital signs reviewed, Patient's Cardiovascular Status Stable, Respiratory Function Stable, Patent Airway, No signs of Nausea or vomiting and Pain level controlled  Post-op Vital Signs: Reviewed and stable  Complications: No apparent anesthesia complications 

## 2013-05-11 NOTE — H&P (Signed)
Clarence Schneider MRN:  [id number] DOB/SEX:  1950-11-19/male  CHIEF COMPLAINT:  Painful left Knee  HISTORY: Patient is a 63 y.o. male presented with a history of pain in the left knee. Onset of symptoms was gradual starting several years ago with gradually worsening course since that time. Prior procedures on the knee include none. Patient has been treated conservatively with over-the-counter NSAIDs and activity modification. Patient currently rates pain in the knee at 9 out of 10 with activity. There is pain at night.  PAST MEDICAL HISTORY: There are no active problems to display for this patient.  Past Medical History  Diagnosis Date  . Esophageal reflux   . Hypertension   . Shortness of breath     with exertion  . Headache(784.0)   . Dizziness   . Lightheadedness     doesn't occur everyday; happens at times; once occured while walking up a incline  . Pinched nerve     in back  . Arthritis   . Neuromuscular disorder     Takes Gabapentin  . Hyperlipemia   . Skin abrasion     under both arms; uses Lotrisone cream twice a day  . Coronary artery disease    Past Surgical History  Procedure Laterality Date  . Tonsillectomy    . Appendectomy    . Inguinal hernia repair Right   . Carpal tunnel release Bilateral   . Shoulder arthroscopy Left   . Knee arthroscopy Bilateral     Left x 1 Right X 2  . Cardiac catheterization      X 3 stents  . Colonoscopy w/ polypectomy       MEDICATIONS:   Prescriptions prior to admission  Medication Sig Dispense Refill  . amLODipine (NORVASC) 5 MG tablet Take 2.5 mg by mouth daily.      Marland Kitchen aspirin 325 MG EC tablet Take 325 mg by mouth daily.      . carvedilol (COREG) 12.5 MG tablet Take 12.5 mg by mouth 2 (two) times daily with a meal.      . clopidogrel (PLAVIX) 75 MG tablet Take 75 mg by mouth daily with breakfast.      . clotrimazole-betamethasone (LOTRISONE) cream Apply 1 application topically 2 (two) times daily.      . colesevelam  (WELCHOL) 625 MG tablet Take 1,250 mg by mouth 2 (two) times daily with a meal.      . ezetimibe (ZETIA) 10 MG tablet Take 10 mg by mouth daily.      Marland Kitchen gabapentin (NEURONTIN) 400 MG capsule Take 800 mg by mouth 2 (two) times daily.      Marland Kitchen losartan (COZAAR) 100 MG tablet Take 100 mg by mouth daily.      . meloxicam (MOBIC) 15 MG tablet Take 15 mg by mouth daily.      . Multiple Vitamins-Minerals (MULTIVITAMIN WITH MINERALS) tablet Take 1 tablet by mouth daily.      Marland Kitchen omega-3 acid ethyl esters (LOVAZA) 1 G capsule Take 2 g by mouth 2 (two) times daily.      Marland Kitchen pyridoxine (B-6) 200 MG tablet Take 200 mg by mouth daily.      . rosuvastatin (CRESTOR) 10 MG tablet Take 15 mg by mouth daily.      . sildenafil (VIAGRA) 50 MG tablet Take 50 mg by mouth daily as needed for erectile dysfunction.      Marland Kitchen testosterone cypionate (DEPOTESTOTERONE CYPIONATE) 200 MG/ML injection Inject 200 mg into the muscle every 14 (fourteen) days.      Marland Kitchen  vitamin B-12 (CYANOCOBALAMIN) 1000 MCG tablet Take 1,000 mcg by mouth daily.      . vitamin C (ASCORBIC ACID) 500 MG tablet Take 500 mg by mouth daily.      . vitamin E 400 UNIT capsule Take 400 Units by mouth daily.        ALLERGIES:   Allergies  Allergen Reactions  . Oxycodone Itching  . Reglan [Metoclopramide]     Makes me act funny  . Tape Rash    "paper tape and Electrodes breaks me out"    REVIEW OF SYSTEMS:  Pertinent items are noted in HPI.   FAMILY HISTORY:  No family history on file.  SOCIAL HISTORY:   History  Substance Use Topics  . Smoking status: Never Smoker   . Smokeless tobacco: Not on file  . Alcohol Use: No     EXAMINATION:  Vital signs in last 24 hours:    General appearance: alert, cooperative and no distress Lungs: clear to auscultation bilaterally Heart: regular rate and rhythm, S1, S2 normal, no murmur, click, rub or gallop Abdomen: soft, non-tender; bowel sounds normal; no masses,  no organomegaly Extremities: extremities  normal, atraumatic, no cyanosis or edema and Homans sign is negative, no sign of DVT Pulses: 2+ and symmetric Skin: Skin color, texture, turgor normal. No rashes or lesions Neurologic: Alert and oriented X 3, normal strength and tone. Normal symmetric reflexes. Normal coordination and gait  Musculoskeletal:  ROM 0-115, Ligaments intact,  Imaging Review Plain radiographs demonstrate severe degenerative joint disease of the left knee. The overall alignment is mild valgus. The bone quality appears to be good for age and reported activity level.  Assessment/Plan: End stage arthritis, left knee   The patient history, physical examination and imaging studies are consistent with advanced degenerative joint disease of the left knee. The patient has failed conservative treatment.  The clearance notes were reviewed.  After discussion with the patient it was felt that Total Knee Replacement was indicated. The procedure,  risks, and benefits of total knee arthroplasty were presented and reviewed. The risks including but not limited to aseptic loosening, infection, blood clots, vascular injury, stiffness, patella tracking problems complications among others were discussed. The patient acknowledged the explanation, agreed to proceed with the plan.  Kristelle Cavallaro 05/11/2013, 7:12 AM

## 2013-05-11 NOTE — Progress Notes (Signed)
Orthopedic Tech Progress Note Patient Details:  Clarence Schneider Oct 10, 1950 [id number]  Patient ID: [name], male   DOB: 1951-01-22, 63 y.o.   MRN: [id number]  Placed pt's lle in cpm @ 0-90 degrees @1935  [name] 05/11/2013, 7:36 PM

## 2013-05-11 NOTE — Anesthesia Preprocedure Evaluation (Addendum)
Anesthesia Evaluation  Patient identified by MRN, date of birth, ID band Patient awake    Airway Mallampati: II TM Distance: >3 FB Neck ROM: Full    Dental  (+) Teeth Intact and Dental Advisory Given   Pulmonary shortness of breath,          Cardiovascular hypertension, + CAD and + Cardiac Stents     Neuro/Psych  Headaches,  Neuromuscular disease    GI/Hepatic GERD-  ,  Endo/Other    Renal/GU      Musculoskeletal   Abdominal   Peds  Hematology   Anesthesia Other Findings   Reproductive/Obstetrics                          Anesthesia Physical Anesthesia Plan  ASA: III  Anesthesia Plan:    Post-op Pain Management:    Induction:   Airway Management Planned:   Additional Equipment:   Intra-op Plan:   Post-operative Plan:   Informed Consent:   Plan Discussed with:   Anesthesia Plan Comments:         Anesthesia Quick Evaluation

## 2013-05-11 NOTE — Anesthesia Procedure Notes (Addendum)
Procedure Name: Intubation Date/Time: 05/11/2013 10:13 AM Performed by: Gaylene Brooks Pre-anesthesia Checklist: Patient identified, Timeout performed, Emergency Drugs available, Suction available and Patient being monitored Patient Re-evaluated:Patient Re-evaluated prior to inductionOxygen Delivery Method: Circle system utilized Preoxygenation: Pre-oxygenation with 100% oxygen Intubation Type: IV induction Ventilation: Oral airway inserted - appropriate to patient size and Two handed mask ventilation required Laryngoscope Size: Mac and 3 Grade View: Grade IV Tube type: Oral Tube size: 7.5 mm Number of attempts: 4 Airway Equipment and Method: Stylet and Bougie stylet Placement Confirmation: ETT inserted through vocal cords under direct vision,  breath sounds checked- equal and bilateral,  positive ETCO2 and CO2 detector Secured at: 23 cm Tube secured with: Tape Dental Injury: Bloody posterior oropharynx  Difficulty Due To: Difficulty was unanticipated, Difficult Airway- due to reduced neck mobility, Difficult Airway- due to large tongue and Difficult Airway- due to anterior larynx Future Recommendations: Recommend- awake intubation Comments: Started with Glidescope after difficulty with mask ventilation. Good view, yet unable to get ETT passed through cords. Attempts made by CRNA and Dr. Oletta Lamas. DL with Sabra Heck 2 by Dr Oletta Lamas. No view. DL with MAC 3 by Dr Tamala Julian. Able to pass ETT over bougie stylet. Sat remained 100% throughout intubation attempts. No dental damage.    Anesthesia Regional Block:  Adductor canal block  Pre-Anesthetic Checklist: ,, timeout performed, Correct Patient, Correct Site, Correct Laterality, Correct Procedure, Correct Position, site marked, Risks and benefits discussed,  Surgical consent,  Pre-op evaluation,  At surgeon's request and post-op pain management  Laterality: Left  Prep: chloraprep       Needles:  Injection technique: Single-shot  Needle Type:  Stimulator Needle - 80          Additional Needles:  Procedures: Doppler guided and nerve stimulator Adductor canal block Narrative:  Start time: 05/11/2013 9:15 AM End time: 05/11/2013 9:30 AM Injection made incrementally with aspirations every 5 mL.  Performed by: Personally  Anesthesiologist: Dr. Oletta Lamas

## 2013-05-11 NOTE — Op Note (Signed)
TOTAL KNEE REPLACEMENT OPERATIVE NOTE:  05/11/2013  4:11 PM  PATIENT:  Clarence Schneider  63 y.o. male  PRE-OPERATIVE DIAGNOSIS:  osteoarthritis left knee  POST-OPERATIVE DIAGNOSIS:  osteoarthritis left knee  PROCEDURE:  Procedure(s): LEFT TOTAL KNEE ARTHROPLASTY  SURGEON:  Surgeon(s): Vickey Huger, MD  PHYSICIAN ASSISTANT: Carlynn Spry, [facility]  ANESTHESIA:   general  DRAINS: Hemovac  SPECIMEN: None  COUNTS:  Correct  TOURNIQUET:   Total Tourniquet Time Documented: Thigh (Left) - 63 minutes Total: Thigh (Left) - 63 minutes   DICTATION:  Indication for procedure:    The patient is a 63 y.o. male who has failed conservative treatment for osteoarthritis left knee.  Informed consent was obtained prior to anesthesia. The risks versus benefits of the operation were explain and in a way the patient can, and did, understand.   On the implant demand matching protocol, this patient scored 10.  Therefore, this patient was not receive a polyethylene insert with vitamin E which is a high demand implant.  Description of procedure:     The patient was taken to the operating room and placed under anesthesia.  The patient was positioned in the usual fashion taking care that all body parts were adequately padded and/or protected.  I foley catheter was not placed.  A tourniquet was applied and the leg prepped and draped in the usual sterile fashion.  The extremity was exsanguinated with the esmarch and tourniquet inflated to 350 mmHg.  Pre-operative range of motion was normal.  The knee was in 5 degree of mild varus.  A midline incision approximately 6-7 inches long was made with a #10 blade.  A new blade was used to make a parapatellar arthrotomy going 2-3 cm into the quadriceps tendon, over the patella, and alongside the medial aspect of the patellar tendon.  A synovectomy was then performed with the #10 blade and forceps. I then elevated the deep MCL off the medial tibial metaphysis  subperiosteally around to the semimembranosus attachment.    I everted the patella and used calipers to measure patellar thickness.  I used the reamer to ream down to appropriate thickness to recreate the native thickness.  I then removed excess bone with the rongeur and sagittal saw.  I used the appropriately sized template and drilled the three lug holes.  I then put the trial in place and measured the thickness with the calipers to ensure recreation of the native thickness.  The trial was then removed and the patella subluxed and the knee brought into flexion.  A homan retractor was place to retract and protect the patella and lateral structures.  A Z-retractor was place medially to protect the medial structures.  The extra-medullary alignment system was used to make cut the tibial articular surface perpendicular to the anamotic axis of the tibia and in 3 degrees of posterior slope.  The cut surface and alignment jig was removed.  I then used the intramedullary alignment guide to make a 6 valgus cut on the distal femur.  I then marked out the epicondylar axis on the distal femur.  The posterior condylar axis measured 3 degrees.  I then used the anterior referencing sizer and measured the femur to be a size 11.  The 4-In-1 cutting block was screwed into place in external rotation matching the posterior condylar angle, making our cuts perpendicular to the epicondylar axis.  Anterior, posterior and chamfer cuts were made with the sagittal saw.  The cutting block and cut pieces were removed.  A lamina spreader was placed in 90 degrees of flexion.  The ACL, PCL, menisci, and posterior condylar osteophytes were removed.  A 14 mm spacer blocked was found to offer good flexion and extension gap balance after minimal in degree releasing.   The scoop retractor was then placed and the femoral finishing block was pinned in place.  The small sagittal saw was used as well as the lug drill to finish the femur.  The  block and cut surfaces were removed and the medullary canal hole filled with autograft bone from the cut pieces.  The tibia was delivered forward in deep flexion and external rotation.  A size G tray was selected and pinned into place centered on the medial 1/3 of the tibial tubercle.  The reamer and keel was used to prepare the tibia through the tray.    I then trialed with the size 11 femur, size G tibia, a 14 mm insert and the 38 patella.  I had excellent flexion/extension gap balance, excellent patella tracking.  Flexion was full and beyond 120 degrees; extension was zero.  These components were chosen and the staff opened them to me on the back table while the knee was lavaged copiously and the cement mixed.  The soft tissue was infiltrated with 60cc of exparel 1.3% through a 21 gauge needle.  I cemented in the components and removed all excess cement.  The polyethylene tibial component was snapped into place and the knee placed in extension while cement was hardening.  The capsule was infilltrated with 30cc of .25% Marcaine with epinephrine.  A hemovac was place in the joint exiting superolaterally.  A pain pump was place superomedially superficial to the arthrotomy.  Once the cement was hard, the tourniquet was let down.  Hemostasis was obtained.  The arthrotomy was closed with figure-8 #1 vicryl sutures.  The deep soft tissues were closed with #0 vicryls and the subcuticular layer closed with a running #2-0 vicryl.  The skin was reapproximated and closed with skin staples.  The wound was dressed with xeroform, 4 x4's, 2 ABD sponges, a single layer of webril and a TED stocking.   The patient was then awakened, extubated, and taken to the recovery room in stable condition.  BLOOD LOSS:  300cc DRAINS: 1 hemovac, 1 pain catheter COMPLICATIONS:  None.  PLAN OF CARE: Admit to inpatient   PATIENT DISPOSITION:  PACU - hemodynamically stable.   Delay start of Pharmacological VTE agent (>24hrs) due  to surgical blood loss or risk of bleeding:  not applicable  Please fax a copy of this op note to my office at [phone] (please only include page 1 and 2 of the Case Information op note)

## 2013-05-11 NOTE — Progress Notes (Signed)
Orthopedic Tech Progress Note Patient Details:  Clarence Schneider [birth year] [id number] CPM applied to Left LE with appropriate settings. Patient already had OHF on bed. Footsie roll provided.  CPM Left Knee CPM Left Knee: On Left Knee Flexion (Degrees): 90 Left Knee Extension (Degrees): 0   Asia R Thompson 05/11/2013, 1:40 PM

## 2013-05-12 ENCOUNTER — Encounter (HOSPITAL_COMMUNITY): Payer: Self-pay | Admitting: Orthopedic Surgery

## 2013-05-12 LAB — BASIC METABOLIC PANEL
BUN: 15 mg/dL (ref 6–23)
CHLORIDE: 102 meq/L (ref 96–112)
CO2: 26 meq/L (ref 19–32)
Calcium: 7.6 mg/dL — ABNORMAL LOW (ref 8.4–10.5)
Creatinine, Ser: 3.38 mg/dL — ABNORMAL HIGH (ref 0.50–1.35)
GFR calc Af Amer: 21 mL/min — ABNORMAL LOW (ref >90)
GFR calc non Af Amer: 18 mL/min — ABNORMAL LOW (ref >90)
GLUCOSE: 95 mg/dL (ref 70–99)
Potassium: 5.2 mEq/L (ref 3.7–5.3)
SODIUM: 139 meq/L (ref 137–147)

## 2013-05-12 LAB — CBC
HEMATOCRIT: 38.4 % — AB (ref 39.0–52.0)
HEMOGLOBIN: 13.1 g/dL (ref 13.0–17.0)
MCH: 31.3 pg (ref 26.0–34.0)
MCHC: 34.1 g/dL (ref 30.0–36.0)
MCV: 91.9 fL (ref 78.0–100.0)
Platelets: 148 10*3/uL — ABNORMAL LOW (ref 150–400)
RBC: 4.18 MIL/uL — AB (ref 4.22–5.81)
RDW: 15.1 % (ref 11.5–15.5)
WBC: 14.9 10*3/uL — AB (ref 4.0–10.5)

## 2013-05-12 MED ORDER — HYDROCODONE-ACETAMINOPHEN 10-325 MG PO TABS: 1.0000 | ORAL_TABLET | ORAL | Status: DC | PRN

## 2013-05-12 MED ORDER — METHOCARBAMOL 500 MG PO TABS: 500.0000 mg | ORAL_TABLET | Freq: Four times a day (QID) | ORAL | Status: DC | PRN

## 2013-05-12 NOTE — Progress Notes (Signed)
Utilization review completed.  

## 2013-05-12 NOTE — Evaluation (Signed)
Physical Therapy Evaluation Patient Details Name: Clarence Schneider MRN: [id number] DOB: 1951/02/28 Today's Date: 05/12/2013 Time: 0912-0946 PT Time Calculation (min): 34 min  PT Assessment / Plan / Recommendation History of Present Illness  Pt is a 63 y/o male admitted s/p L TKA  Clinical Impression  This patient presents with acute pain and decreased functional independence following the above mentioned procedure. At the time of PT eval, pt was able to perform transfers and ambulation with min assist to min guard for safety. This patient is appropriate for skilled PT interventions to address functional limitations, improve safety and independence with functional mobility, and return to PLOF. Anticipate he will progress well with physical therapy.      PT Assessment  Patient needs continued PT services    Follow Up Recommendations  Home health PT    Does the patient have the potential to tolerate intense rehabilitation      Barriers to Discharge        Equipment Recommendations  None recommended by PT    Recommendations for Other Services     Frequency 7X/week    Precautions / Restrictions Precautions Precautions: Fall;Knee Precaution Comments: Discussed towel roll under heel and NO pillow under knee.  Restrictions Weight Bearing Restrictions: Yes LLE Weight Bearing: Weight bearing as tolerated   Pertinent Vitals/Pain Pt reports 7/10 pain at rest, and 5/10 pain during ambulation.       Mobility  Bed Mobility Overal bed mobility: Needs Assistance Bed Mobility: Supine to Sit Supine to sit: Min assist General bed mobility comments: VC's for sequencing and technique. Assist for movement and support of L LE.  Transfers Overall transfer level: Needs assistance Equipment used: Rolling walker (2 wheeled) Transfers: Sit to/from Stand Sit to Stand: Min assist General transfer comment: VC's for hand placement on seated surface for safety. Assist to power up to full stand  initially.  Ambulation/Gait Ambulation/Gait assistance: Min guard Ambulation Distance (Feet): 75 Feet Assistive device: Rolling walker (2 wheeled) Gait Pattern/deviations: Step-to pattern;Step-through pattern;Decreased stride length;Trunk flexed Gait velocity: Decreased Gait velocity interpretation: Below normal speed for age/gender General Gait Details: VC's for sequencing and safety awareness with the RW. Specific cueing for increased heel strike, increased step-through gait pattern, and encouraged fluidity of walker movement.     Exercises Total Joint Exercises Ankle Circles/Pumps: 10 reps Quad Sets: 10 reps Heel Slides: 10 reps Goniometric ROM: 7-72   PT Diagnosis: Difficulty walking;Acute pain  PT Problem List: Decreased strength;Decreased range of motion;Decreased activity tolerance;Decreased balance;Decreased mobility;Decreased knowledge of use of DME;Decreased safety awareness;Pain PT Treatment Interventions: DME instruction;Gait training;Stair training;Functional mobility training;Therapeutic activities;Therapeutic exercise;Neuromuscular re-education;Patient/family education     PT Goals(Current goals can be found in the care plan section) Acute Rehab PT Goals Patient Stated Goal: To return home PT Goal Formulation: With patient/family Time For Goal Achievement: 05/26/13 Potential to Achieve Goals: Good  Visit Information  Last PT Received On: 05/12/13 History of Present Illness: Pt is a 63 y/o male admitted s/p L TKA       Prior [place] expects to be discharged to:: Private residence Living Arrangements: Spouse/significant other Available Help at Discharge: Family;Available 24 hours/day Type of Home: House Home Access: Stairs to enter [employer] of Steps: 1 Entrance Stairs-Rails: Right Home Layout: Two level Alternate Level Stairs-Number of Steps: 1 step to den Alternate Level Stairs-Rails: None (Doorway) Home  Equipment: Environmental consultant - 2 wheels;Bedside commode (Tub shower) Prior Function Level of Independence: Independent Communication Communication: No difficulties Dominant Hand:  Right    Cognition  Cognition Arousal/Alertness: Awake/alert Behavior During Therapy: WFL for tasks assessed/performed Overall Cognitive Status: Within Functional Limits for tasks assessed    Extremity/Trunk Assessment Upper Extremity Assessment Upper Extremity Assessment: Defer to OT evaluation Lower Extremity Assessment Lower Extremity Assessment: Generalized weakness;LLE deficits/detail LLE Deficits / Details: Decreased strength and AROM consistent with TKA LLE: Unable to fully assess due to pain Cervical / Trunk Assessment Cervical / Trunk Assessment: Normal   Balance Balance Overall balance assessment: No apparent balance deficits (not formally assessed)  End of Session PT - End of Session Equipment Utilized During Treatment: Gait belt Activity Tolerance: Patient tolerated treatment well Patient left: in chair;with call bell/phone within reach;with family/visitor present Nurse Communication: Mobility status CPM Left Knee CPM Left Knee: Off  GP     Jolyn Lent 05/12/2013, 12:04 PM  Jolyn Lent, PT, DPT [phone]

## 2013-05-12 NOTE — Progress Notes (Signed)
Orthopedic Tech Progress Note Patient Details:  Clarence Schneider 05/30/1950 [id number]  Patient ID: [name], male   DOB: [birth year], 63 y.o.   MRN: [id number] Placed pt's lle in cpm @ 0-70 degrees @ 1500  [name] 05/12/2013, 3:02 PM

## 2013-05-12 NOTE — Care Management Note (Signed)
CARE MANAGEMENT NOTE 05/12/2013  Patient:  BRYCEN, BEAN   Account Number:  [id number]  Date Initiated:  05/12/2013  Documentation initiated by:  Ricki Miller  Subjective/Objective Assessment:   63 yr old male s/p left total knee arthroplasty.     Action/Plan:   Case manager spoke with patient and wife concerning home health and DME needs at discharge. Patient preoperatively setup with [facility], no changes. They have rolling walker,3in1 from wife's surgery. CPM delivered.   Anticipated DC Date:  05/13/2013   Anticipated DC Plan:  [place]  CM consult      [facility] Choice  HOME HEALTH  DURABLE MEDICAL EQUIPMENT   Choice offered to / List presented to:  C-1 Patient   DME arranged  CPM      DME agency  [employer]     [place] arranged  HH-2 PT      [place]   Status of service:  Completed, signed off Medicare Important Message given?   (If response is "NO", the following Medicare IM given date fields will be blank) Date Medicare IM given:   Date Additional Medicare IM given:    Discharge Disposition:  [place]

## 2013-05-12 NOTE — Discharge Instructions (Signed)
Home Health Physical Therapy to be provided by [facility] [phone]  Diet: As you were doing prior to hospitalization   Activity:  Increase activity slowly as tolerated                  No lifting or driving for 6 weeks  Shower:  May shower without a dressing once there is no drainage from your wound.                 Do NOT wash over the wound.                 Dressing:  You may change your dressing on Wednesday                    Then change the dressing daily with sterile 4"x4"s gauze dressing                     And TED hose for knees.  Weight Bearing:  Weight bearing as tolerated as taught in physical therapy.  Use a                                walker or Crutches as instructed.  To prevent constipation: you may use a stool softener such as -               Colace ( over the counter) 100 mg by mouth twice a day                Drink plenty of fluids ( prune juice may be helpful) and high fiber foods                Miralax ( over the counter) for constipation as needed.    Precautions:  If you experience chest pain or shortness of breath - call 911 immediately               For transfer to the hospital emergency department!!               If you develop a fever greater that 101 F, purulent drainage from wound,                             increased redness or drainage from wound, or calf pain -- Call the office.  Follow- Up Appointment:  Please call for an appointment to be seen on 05/26/13                                              [facility] office:  [phone]            [address] [place], [place] [address]

## 2013-05-12 NOTE — Progress Notes (Signed)
Physical Therapy Treatment Patient Details Name: Clarence Schneider MRN: [id number] DOB: March 27, 1951 Today's Date: 05/12/2013 Time: 3810-1751 PT Time Calculation (min): 39 min  PT Assessment / Plan / Recommendation  History of Present Illness Pt is a 63 y/o male admitted s/p L TKA   PT Comments   Pt progressing towards physical therapy goals. Pt limited this session by lethargy, pt reports from pain medication he received prior to therapy session. Many instances of pt snoring with eyes open, falling asleep sitting up on EOB, and while static standing. Pt wanted to ambulate as much as possible to try and wake himself up. Pt's wife reports they were told he may d/c this afternoon, however pt would benefit from continued PT services prior to d/c for safety, as pt was not safe to attempt the steps this session.   Follow Up Recommendations  Home health PT     Does the patient have the potential to tolerate intense rehabilitation     Barriers to Discharge        Equipment Recommendations  None recommended by PT    Recommendations for Other Services    Frequency 7X/week   Progress towards PT Goals Progress towards PT goals: Progressing toward goals  Plan Current plan remains appropriate    Precautions / Restrictions Precautions Precautions: Fall;Knee Precaution Comments: Discussed towel roll under heel and NO pillow under knee.  Restrictions Weight Bearing Restrictions: Yes LLE Weight Bearing: Weight bearing as tolerated   Pertinent Vitals/Pain Pt reports minimal pain throughout session.     Mobility  Bed Mobility Overal bed mobility: Needs Assistance Bed Mobility: Supine to Sit;Sit to Supine Supine to sit: Min assist Sit to supine: Supervision General bed mobility comments: VC's for sequencing and technique. Assist for movement and support of L LE to transition to EOB. No physical assist required for sit>supine.  Transfers Overall transfer level: Needs assistance Equipment  used: Rolling walker (2 wheeled) Transfers: Sit to/from Stand Sit to Stand: Min guard General transfer comment: VC's for hand placement on seated surface for safety.  Ambulation/Gait Ambulation/Gait assistance: Min guard Ambulation Distance (Feet): 125 Feet Assistive device: Rolling walker (2 wheeled) Gait Pattern/deviations: Step-through pattern;Decreased stride length;Trunk flexed Gait velocity: Decreased Gait velocity interpretation: Below normal speed for age/gender General Gait Details: VC's for sequencing and safety awareness with the RW. Specific cueing for increased heel strike, increased step-through gait pattern, and encouraged fluidity of walker movement.     Exercises     PT Diagnosis:    PT Problem List:   PT Treatment Interventions:     PT Goals (current goals can now be found in the care plan section) Acute Rehab PT Goals Patient Stated Goal: To return home PT Goal Formulation: With patient/family Time For Goal Achievement: 05/26/13 Potential to Achieve Goals: Good  Visit Information  Last PT Received On: 05/12/13 History of Present Illness: Pt is a 63 y/o male admitted s/p L TKA    Subjective Data  Subjective: "I can't keep my eyes open." Patient Stated Goal: To return home   Cognition  Cognition Arousal/Alertness: Awake/alert Behavior During Therapy: WFL for tasks assessed/performed Overall Cognitive Status: Within Functional Limits for tasks assessed    Balance  Balance Overall balance assessment: No apparent balance deficits (not formally assessed)  End of Session PT - End of Session Equipment Utilized During Treatment: Gait belt Activity Tolerance: Patient limited by fatigue;Patient limited by lethargy Patient left: with call bell/phone within reach;with family/visitor present;in bed Nurse Communication: Mobility status  GP     Jolyn Lent 05/12/2013, 3:31 PM  Jolyn Lent, [place], DPT [phone]

## 2013-05-12 NOTE — Anesthesia Postprocedure Evaluation (Signed)
  Anesthesia Post-op Note  Patient: Clarence Schneider  Procedure(s) Performed: Procedure(s): LEFT TOTAL KNEE ARTHROPLASTY (Left)  Patient Location: PACU  Anesthesia Type:General and Regional  Level of Consciousness: awake, alert  and oriented  Airway and Oxygen Therapy: Patient Spontanous Breathing and Patient connected to nasal cannula oxygen  Post-op Pain: mild  Post-op Assessment: Post-op Vital signs reviewed, Patient's Cardiovascular Status Stable, Respiratory Function Stable, Patent Airway, No signs of Nausea or vomiting and Pain level controlled  Post-op Vital Signs: Reviewed and stable  Complications: No apparent anesthesia complications

## 2013-05-12 NOTE — Progress Notes (Signed)
Pt's O2  sats frequently down to 79-82% during the night when asleep and while on CPap and 3 liters oxygen.  Sats would come back up fairly quickly when pt woke up from pulse ox alarm.

## 2013-05-12 NOTE — Progress Notes (Signed)
SPORTS MEDICINE AND JOINT REPLACEMENT  Lara Mulch, MD   Carlynn Spry, PA-C [address], [place], [place]  [id number]                             [phone]   PROGRESS NOTE  Subjective:  negative for Chest Pain  negative for Shortness of Breath  negative for Nausea/Vomiting   negative for Calf Pain  negative for Bowel Movement   Tolerating Diet: yes         Patient reports pain as 4 on 0-10 scale.    Objective: Vital signs in last 24 hours:   Patient Vitals for the past 24 hrs:  BP Temp Temp src Pulse Resp SpO2  05/12/13 0627 132/80 mmHg 97.7 F (36.5 C) - 95 18 99 %  05/12/13 0450 135/76 mmHg 97.7 F (36.5 C) - 85 18 98 %  05/12/13 0044 137/75 mmHg 98.5 F (36.9 C) - 90 16 97 %  05/11/13 2258 - - - 84 20 99 %  05/11/13 2154 152/102 mmHg 97.9 F (36.6 C) - 75 18 98 %  05/11/13 1658 154/109 mmHg 97.7 F (36.5 C) Oral 77 16 95 %  05/11/13 1645 - - - 86 23 100 %  05/11/13 1637 - - - 79 12 100 %  05/11/13 1633 152/102 mmHg - - 84 22 100 %  05/11/13 1630 - 97.6 F (36.4 C) - 81 16 100 %  05/11/13 1618 143/127 mmHg - - 82 17 100 %  05/11/13 1615 - - - 79 13 99 %  05/11/13 1603 137/89 mmHg - - 74 16 100 %  05/11/13 1600 - - - 80 17 98 %  05/11/13 1545 - - - 74 10 100 %  05/11/13 1532 148/122 mmHg - - 74 14 100 %  05/11/13 1530 - - - 75 18 100 %  05/11/13 1518 150/92 mmHg - - 74 24 100 %  05/11/13 1515 - - - 83 16 100 %  05/11/13 1503 157/106 mmHg - - - 14 -  05/11/13 1500 - - - 63 10 100 %  05/11/13 1448 144/106 mmHg - - 61 11 99 %  05/11/13 1445 165/121 mmHg - - 61 5 100 %  05/11/13 1435 166/108 mmHg - - 65 13 98 %  05/11/13 1430 - 97.2 F (36.2 C) - 75 10 100 %  05/11/13 1418 153/101 mmHg - - 72 18 100 %  05/11/13 1415 - - - 62 9 93 %  05/11/13 1404 163/50 mmHg - - 69 10 100 %  05/11/13 1400 - - - 58 6 99 %  05/11/13 1347 155/107 mmHg - - - 16 -  05/11/13 1345 - - - 76 14 95 %  05/11/13 1333 138/96 mmHg - - - 6 -  05/11/13 1330 - - - 64 18 99 %   05/11/13 1325 - - - - 13 -  05/11/13 1318 161/104 mmHg - - 53 9 100 %  05/11/13 1315 - - - 54 13 100 %  05/11/13 1302 146/109 mmHg - - 80 13 99 %  05/11/13 1300 - - - - 7 99 %  05/11/13 1248 155/98 mmHg - - - 24 -  05/11/13 1245 - - - 65 13 100 %  05/11/13 1240 - - - 69 12 98 %  05/11/13 1233 149/107 mmHg 97.4 F (36.3 C) - 66 7 100 %  05/11/13 1232 - - -  92 6 97 %  05/11/13 0940 - - - 74 12 94 %  05/11/13 0939 135/101 mmHg - - 68 11 93 %  05/11/13 0938 - - - 57 11 92 %  05/11/13 0937 - - - 70 15 100 %  05/11/13 0936 - - - 66 9 100 %  05/11/13 0935 127/81 mmHg - - 62 10 100 %  05/11/13 0934 - - - 62 12 100 %  05/11/13 0930 121/69 mmHg - - 73 26 99 %  05/11/13 0929 - - - 70 9 98 %  05/11/13 0928 - - - 68 14 98 %  05/11/13 0927 - - - 66 10 100 %  05/11/13 0926 - - - 66 14 89 %  05/11/13 0925 138/69 mmHg - - 70 8 100 %  05/11/13 0924 - - - 71 9 98 %  05/11/13 0923 - - - 67 10 100 %  05/11/13 0922 - - - 71 17 100 %  05/11/13 0921 - - - 68 19 100 %  05/11/13 0920 130/89 mmHg - - 73 24 100 %  05/11/13 0919 - - - 70 16 100 %  05/11/13 0918 - - - 71 16 100 %  05/11/13 0917 - - - 68 11 98 %  05/11/13 0915 124/89 mmHg - - 69 12 100 %  05/11/13 0910 119/83 mmHg - - 69 10 100 %  05/11/13 0905 116/88 mmHg - - 62 11 100 %  05/11/13 0900 131/84 mmHg - - 64 13 100 %  05/11/13 0855 130/80 mmHg - - 67 17 100 %  05/11/13 0854 - - - 64 12 100 %  05/11/13 0853 130/80 mmHg - - - 12 -  05/11/13 0852 - - - 62 13 100 %  05/11/13 0851 - - - 68 21 100 %  05/11/13 0850 - - - 69 14 99 %  05/11/13 0849 - - - - 13 -  05/11/13 0842 - - - 72 19 99 %  05/11/13 0841 - - - 66 13 100 %  05/11/13 0840 145/93 mmHg - - 69 11 100 %  05/11/13 0835 134/85 mmHg - - 64 13 100 %  05/11/13 0830 130/86 mmHg - - 71 15 100 %  05/11/13 0825 129/81 mmHg - - 65 10 100 %  05/11/13 0820 126/83 mmHg - - 68 12 100 %  05/11/13 0815 123/80 mmHg - - 67 10 100 %  05/11/13 0811 114/71 mmHg - - 69 - 97 %  05/11/13 0810  114/71 mmHg - - 69 14 98 %  05/11/13 0755 139/88 mmHg 97.9 F (36.6 C) Oral 72 16 99 %    @flow {1959:LAST@   Intake/Output from previous day:   01/26 0701 - 01/27 0700 In: 3165 [P.O.:480; I.V.:2460] Out: 1500 [Urine:250; Drains:900]   Intake/Output this shift:       Intake/Output     01/26 0701 - 01/27 0700 01/27 0701 - 01/28 0700   P.O. 480    I.V. 2460    Other 225    Total Intake 3165     Urine 250    Drains 900    Other 350    Total Output 1500     Net +1665             LABORATORY DATA:  Recent Labs  05/12/13 0553  WBC 14.9*  HGB 13.1  HCT 38.4*  PLT 148*    Recent Labs  05/12/13 0553  NA 139  K  5.2  CL 102  CO2 26  BUN 15  CREATININE 3.38*  GLUCOSE 95  CALCIUM 7.6*   Lab Results  Component Value Date   INR 0.98 05/01/2013    Examination:  General appearance: alert, cooperative and no distress Extremities: extremities normal, atraumatic, no cyanosis or edema and Homans sign is negative, no sign of DVT  Wound Exam: clean, dry, intact   Drainage:  Moderate amount Serosanguinous exudate  Motor Exam: EHL and FHL Intact  Sensory Exam: Deep Peroneal normal   Assessment:    1 Day Post-Op  Procedure(s) (LRB): LEFT TOTAL KNEE ARTHROPLASTY (Left)  ADDITIONAL DIAGNOSIS:  Active Problems:   S/P total knee arthroplasty  Acute Blood Loss Anemia   Plan: Physical Therapy as ordered Weight Bearing as Tolerated (WBAT)  DVT Prophylaxis:  Aspirin and plavix  DISCHARGE PLAN: Home  DISCHARGE NEEDS: HHPT, CPM, Walker and 3-in-1 comode seat         Arye Weyenberg 05/12/2013, 7:40 AM

## 2013-05-19 NOTE — Discharge Summary (Signed)
SPORTS MEDICINE & JOINT REPLACEMENT   Lara Mulch, MD   Carlynn Spry, PA-C [address], [place], [place]  [address]                             [phone]  PATIENT ID: Clarence Schneider        MRN:  [id number]          DOB/AGE: 1950-08-26 / 63 y.o.    DISCHARGE SUMMARY  ADMISSION DATE:    05/11/2013 DISCHARGE DATE:   05/12/2013  ADMISSION DIAGNOSIS: osteoarthritis left knee    DISCHARGE DIAGNOSIS:  osteoarthritis left knee    ADDITIONAL DIAGNOSIS: Active Problems:   S/P total knee arthroplasty  Past Medical History  Diagnosis Date  . Esophageal reflux   . Hypertension   . Shortness of breath     with exertion  . Headache(784.0)   . Dizziness   . Lightheadedness     doesn't occur everyday; happens at times; once occured while walking up a incline  . Pinched nerve     in back  . Arthritis   . Neuromuscular disorder     Takes Gabapentin  . Hyperlipemia   . Skin abrasion     under both arms; uses Lotrisone cream twice a day  . Coronary artery disease     PROCEDURE: Procedure(s): LEFT TOTAL KNEE ARTHROPLASTY on 05/11/2013  CONSULTS:     HISTORY:  See H&P in chart  HOSPITAL COURSE:  Clarence Schneider is a 63 y.o. admitted on 05/11/2013 and found to have a diagnosis of osteoarthritis left knee.  After appropriate laboratory studies were obtained  they were taken to the operating room on 05/11/2013 and underwent Procedure(s): LEFT TOTAL KNEE ARTHROPLASTY.   They were given perioperative antibiotics:  Anti-infectives   Start     Dose/Rate Route Frequency Ordered Stop   05/11/13 1800  ceFAZolin (ANCEF) IVPB 1 g/50 mL premix     1 g 100 mL/hr over 30 Minutes Intravenous Every 6 hours 05/11/13 1705 05/11/13 2358   05/11/13 0600  ceFAZolin (ANCEF) IVPB 2 g/50 mL premix     2 g 100 mL/hr over 30 Minutes Intravenous On call to O.R. 05/10/13 1336 05/11/13 1017    .  Tolerated the procedure well.  Placed with a foley intraoperatively.  Given Ofirmev at  induction and for 48 hours.    POD# 1: Vital signs were stable.  Patient denied Chest pain, shortness of breath, or calf pain.  Patient was started on Lovenox 30 mg subcutaneously twice daily at 8am.  Consults to PT, OT, and care management were made.  The patient was weight bearing as tolerated.  CPM was placed on the operative leg 0-90 degrees for 6-8 hours a day.  Incentive spirometry was taught.  Dressing was changed.  Marcaine pump and hemovac were discontinued.      POD #2, Continued  PT for ambulation and exercise program.  IV saline locked.  O2 discontinued.    The remainder of the hospital course was dedicated to ambulation and strengthening.   The patient was discharged on 1 day post op in  Good condition.  Blood products given:none  DIAGNOSTIC STUDIES: Recent vital signs: No data found.      Recent laboratory studies: No results found for this basename: WBC, HGB, HCT, PLT,  in the last 168 hours No results found for this basename: NA, K, CL, CO2, BUN, CREATININE, GLUCOSE, CALCIUM,  in the last 168 hours Lab Results  Component Value Date   INR 0.98 05/01/2013     Recent Radiographic Studies :  Dg Chest 2 View  05/01/2013   CLINICAL DATA:  Preop for left knee arthroplasty  EXAM: CHEST  2 VIEW  COMPARISON:  12/29/2010  FINDINGS: Cardiomediastinal silhouette is stable. No acute infiltrate or pleural effusion. No pulmonary edema.  IMPRESSION: No active cardiopulmonary disease.   Electronically Signed   By: Lahoma Crocker M.D.   On: 05/01/2013 13:03    DISCHARGE INSTRUCTIONS: Discharge Orders   Future Orders Complete By Expires   Call MD / Call 911  As directed    Comments:     If you experience chest pain or shortness of breath, CALL 911 and be transported to the hospital emergency room.  If you develope a fever above 101 F, pus (white drainage) or increased drainage or redness at the wound, or calf pain, call your surgeon's office.   Change dressing  As directed    Comments:      Change dressing on wednesday, then change the dressing daily with sterile 4 x 4 inch gauze dressing and apply TED hose.   Constipation Prevention  As directed    Comments:     Drink plenty of fluids.  Prune juice may be helpful.  You may use a stool softener, such as Colace (over the counter) 100 mg twice a day.  Use MiraLax (over the counter) for constipation as needed.   CPM  As directed    Comments:     Continuous passive motion machine (CPM):      Use the CPM from 0 to 90 for 6-8 hours per day.      You may increase by 10 per day.  You may break it up into 2 or 3 sessions per day.      Use CPM for 2 weeks or until you are told to stop.   Diet - low sodium heart healthy  As directed    Do not put a pillow under the knee. Place it under the heel.  As directed    Driving restrictions  As directed    Comments:     No driving for 6 weeks   Increase activity slowly as tolerated  As directed    Lifting restrictions  As directed    Comments:     No lifting for 6 weeks   TED hose  As directed    Comments:     Use stockings (TED hose) for 3 weeks on both leg(s).  You may remove them at night for sleeping.      DISCHARGE MEDICATIONS:     Medication List         amLODipine 5 MG tablet  Commonly known as:  NORVASC  Take 2.5 mg by mouth daily.     aspirin 325 MG EC tablet  Take 325 mg by mouth daily.     carvedilol 12.5 MG tablet  Commonly known as:  COREG  Take 12.5 mg by mouth 2 (two) times daily with a meal.     clopidogrel 75 MG tablet  Commonly known as:  PLAVIX  Take 75 mg by mouth daily with breakfast.     clotrimazole-betamethasone cream  Commonly known as:  LOTRISONE  Apply 1 application topically 2 (two) times daily.     colesevelam 625 MG tablet  Commonly known as:  WELCHOL  Take 1,250 mg by mouth 2 (two) times daily with  a meal.     ezetimibe 10 MG tablet  Commonly known as:  ZETIA  Take 10 mg by mouth daily.     gabapentin 400 MG capsule  Commonly known  as:  NEURONTIN  Take 800 mg by mouth 2 (two) times daily.     HYDROcodone-acetaminophen 10-325 MG per tablet  Commonly known as:  NORCO  Take 1-2 tablets by mouth every 4 (four) hours as needed (breakthrough pain).     losartan 100 MG tablet  Commonly known as:  COZAAR  Take 100 mg by mouth daily.     meloxicam 15 MG tablet  Commonly known as:  MOBIC  Take 15 mg by mouth daily.     methocarbamol 500 MG tablet  Commonly known as:  ROBAXIN  Take 1-2 tablets (500-1,000 mg total) by mouth every 6 (six) hours as needed for muscle spasms.     multivitamin with minerals tablet  Take 1 tablet by mouth daily.     omega-3 acid ethyl esters 1 G capsule  Commonly known as:  LOVAZA  Take 2 g by mouth 2 (two) times daily.     pyridoxine 200 MG tablet  Commonly known as:  B-6  Take 200 mg by mouth daily.     rosuvastatin 10 MG tablet  Commonly known as:  CRESTOR  Take 15 mg by mouth daily.     sildenafil 50 MG tablet  Commonly known as:  VIAGRA  Take 50 mg by mouth daily as needed for erectile dysfunction.     testosterone cypionate 200 MG/ML injection  Commonly known as:  DEPOTESTOTERONE CYPIONATE  Inject 200 mg into the muscle every 14 (fourteen) days.     vitamin B-12 1000 MCG tablet  Commonly known as:  CYANOCOBALAMIN  Take 1,000 mcg by mouth daily.     vitamin C 500 MG tablet  Commonly known as:  ASCORBIC ACID  Take 500 mg by mouth daily.     vitamin E 400 UNIT capsule  Take 400 Units by mouth daily.        FOLLOW UP VISIT:       Follow-up Information   Follow up with Rudean Haskell, MD. Call on 05/26/2013.   Specialty:  Orthopedic Surgery   Contact information:   [address] [place] [address] [phone]       DISPOSITION: HOME   CONDITION:  Good   Laval Cafaro 05/19/2013, 7:31 PM

## 2013-11-02 ENCOUNTER — Other Ambulatory Visit: Payer: Self-pay | Admitting: Orthopedic Surgery

## 2013-11-16 ENCOUNTER — Encounter (HOSPITAL_COMMUNITY): Payer: Self-pay | Admitting: Pharmacy Technician

## 2013-11-17 NOTE — Pre-Procedure Instructions (Addendum)
Clarence Schneider  11/17/2013   Your procedure is scheduled on: Monday, August 17  Report to [facility] Admitting at 5:30 AM.  Call this number if you have problems the morning of surgery: [phone]   Remember:   Do not eat food or drink liquids after midnight Sunday, November 29, 2013   Take these medicines the morning of surgery with A SIP OF WATER: amLODipine (NORVASC), carvedilol (COREG),  gabapentin (NEURONTIN),welchol      if needed: traMADol Veatrice Bourbon) for pain ,methocarbamal  Stop Plavix according to doctors instructions  Stop taking Aspirin, Multivitamins, and herbal medications (  Vitamin c, vitamin E and fish oil)  Do not take any NSAIDs ie: Ibuprofen, Advil, Naproxen or any medication containing Aspirin like meloxicam (MOBIC); stop 5 days prior to procedure ( Monday,  11/25/13) .  Do not wear jewelry, make-up or nail polish.  Do not wear lotions, powders, or perfumes. You may wear deodorant.  Do not shave 48 hours prior to surgery. Men may shave face and neck.  Do not bring valuables to the hospital.  [facility] is not responsible  for any belongings or valuables.               Contacts, dentures or bridgework may not be worn into surgery.  Leave suitcase in the car. After surgery it may be brought to your room.  For patients admitted to the hospital, discharge time is determined by your treatment team.               Patients discharged the day of surgery will not be allowed to drive home.  Name and phone number of your driver:   Special Instructions:  Special Instructions:Special Instructions: [facility] - Preparing for Surgery  Before surgery, you can play an important role.  Because skin is not sterile, your skin needs to be as free of germs as possible.  You can reduce the number of germs on you skin by washing with CHG (chlorahexidine gluconate) soap before surgery.  CHG is an antiseptic cleaner which kills germs and bonds with the skin to continue killing  germs even after washing.  Please DO NOT use if you have an allergy to CHG or antibacterial soaps.  If your skin becomes reddened/irritated stop using the CHG and inform your nurse when you arrive at Short Stay.  Do not shave (including legs and underarms) for at least 48 hours prior to the first CHG shower.  You may shave your face.  Please follow these instructions carefully:   1.  Shower with CHG Soap the night before surgery and the morning of Surgery.  2.  If you choose to wash your hair, wash your hair first as usual with your normal shampoo.  3.  After you shampoo, rinse your hair and body thoroughly to remove the Shampoo.  4.  Use CHG as you would any other liquid soap.  You can apply chg directly  to the skin and wash gently with scrungie or a clean washcloth.  5.  Apply the CHG Soap to your body ONLY FROM THE NECK DOWN.  Do not use on open wounds or open sores.  Avoid contact with your eyes, ears, mouth and genitals (private parts).  Wash genitals (private parts) with your normal soap.  6.  Wash thoroughly, paying special attention to the area where your surgery will be performed.  7.  Thoroughly rinse your body with warm water from the neck down.  8.  DO NOT shower/wash with your normal soap after using and rinsing off the CHG Soap.  9.  Pat yourself dry with a clean towel.            10.  Wear clean pajamas.            11.  Place clean sheets on your bed the night of your first shower and do not sleep with pets.  Day of Surgery  Do not apply any lotions the morning of surgery.  Please wear clean clothes to the hospital/surgery center.   Please read over the following fact sheets that you were given: Pain Booklet, Coughing and Deep Breathing, Blood Transfusion Information, MRSA Information and Surgical Site Infection Prevention

## 2013-11-18 ENCOUNTER — Encounter (HOSPITAL_COMMUNITY): Payer: Self-pay

## 2013-11-18 ENCOUNTER — Encounter (HOSPITAL_COMMUNITY)
Admission: RE | Admit: 2013-11-18 | Discharge: 2013-11-18 | Disposition: A | Discharge status: Home / Self Care | Payer: PRIVATE HEALTH INSURANCE | Source: Ambulatory Visit | Attending: Orthopedic Surgery | Admitting: Orthopedic Surgery

## 2013-11-18 ENCOUNTER — Encounter (HOSPITAL_COMMUNITY): Admission: RE | Admit: 2013-11-18 | Payer: PRIVATE HEALTH INSURANCE | Source: Ambulatory Visit

## 2013-11-18 DIAGNOSIS — Z01818 Encounter for other preprocedural examination: Secondary | ICD-10-CM | POA: Insufficient documentation

## 2013-11-18 DIAGNOSIS — Z01812 Encounter for preprocedural laboratory examination: Secondary | ICD-10-CM | POA: Insufficient documentation

## 2013-11-18 HISTORY — DX: Sleep apnea, unspecified: G47.30

## 2013-11-18 LAB — URINALYSIS, ROUTINE W REFLEX MICROSCOPIC
Bilirubin Urine: NEGATIVE
Glucose, UA: NEGATIVE mg/dL
Hgb urine dipstick: NEGATIVE
Ketones, ur: NEGATIVE mg/dL
LEUKOCYTES UA: NEGATIVE
NITRITE: NEGATIVE
PH: 5 (ref 5.0–8.0)
Protein, ur: NEGATIVE mg/dL
Specific Gravity, Urine: 1.035 — ABNORMAL HIGH (ref 1.005–1.030)
UROBILINOGEN UA: 0.2 mg/dL (ref 0.0–1.0)

## 2013-11-18 LAB — COMPREHENSIVE METABOLIC PANEL
ALBUMIN: 3.8 g/dL (ref 3.5–5.2)
ALT: 32 U/L (ref 0–53)
AST: 34 U/L (ref 0–37)
Alkaline Phosphatase: 92 U/L (ref 39–117)
Anion gap: 8 (ref 5–15)
BUN: 10 mg/dL (ref 6–23)
CALCIUM: 8.8 mg/dL (ref 8.4–10.5)
CO2: 28 mEq/L (ref 19–32)
CREATININE: 1.09 mg/dL (ref 0.50–1.35)
Chloride: 105 mEq/L (ref 96–112)
GFR calc Af Amer: 83 mL/min — ABNORMAL LOW (ref >90)
GFR calc non Af Amer: 71 mL/min — ABNORMAL LOW (ref >90)
Glucose, Bld: 99 mg/dL (ref 70–99)
Potassium: 4.7 mEq/L (ref 3.7–5.3)
Sodium: 141 mEq/L (ref 137–147)
Total Bilirubin: 0.4 mg/dL (ref 0.3–1.2)
Total Protein: 6.9 g/dL (ref 6.0–8.3)

## 2013-11-18 LAB — CBC WITH DIFFERENTIAL/PLATELET
BASOS ABS: 0 10*3/uL (ref 0.0–0.1)
Basophils Relative: 1 % (ref 0–1)
EOS PCT: 4 % (ref 0–5)
Eosinophils Absolute: 0.1 10*3/uL (ref 0.0–0.7)
HCT: 46.4 % (ref 39.0–52.0)
Hemoglobin: 14.9 g/dL (ref 13.0–17.0)
Lymphocytes Relative: 38 % (ref 12–46)
Lymphs Abs: 1.4 10*3/uL (ref 0.7–4.0)
MCH: 27.2 pg (ref 26.0–34.0)
MCHC: 32.1 g/dL (ref 30.0–36.0)
MCV: 84.8 fL (ref 78.0–100.0)
MONO ABS: 0.4 10*3/uL (ref 0.1–1.0)
Monocytes Relative: 11 % (ref 3–12)
NEUTROS ABS: 1.7 10*3/uL (ref 1.7–7.7)
Neutrophils Relative %: 46 % (ref 43–77)
PLATELETS: 168 10*3/uL (ref 150–400)
RBC: 5.47 MIL/uL (ref 4.22–5.81)
RDW: 18.6 % — AB (ref 11.5–15.5)
WBC: 3.7 10*3/uL — AB (ref 4.0–10.5)

## 2013-11-18 LAB — TYPE AND SCREEN
ABO/RH(D): B POS
ANTIBODY SCREEN: NEGATIVE

## 2013-11-18 LAB — PROTIME-INR
INR: 1.12 (ref 0.00–1.49)
PROTHROMBIN TIME: 14.4 s (ref 11.6–15.2)

## 2013-11-18 LAB — SURGICAL PCR SCREEN
MRSA, PCR: NEGATIVE
Staphylococcus aureus: NEGATIVE

## 2013-11-18 LAB — APTT: APTT: 29 s (ref 24–37)

## 2013-11-18 NOTE — Progress Notes (Addendum)
req'd last office notes?3 wks. Patient to call dr  Ask when to stop plavix.

## 2013-11-19 LAB — URINE CULTURE

## 2013-11-19 NOTE — Progress Notes (Signed)
Anesthesia Chart Review: Patient is a 62 year old male scheduled for right TKA on 11/30/13 by Dr. Ronnie Derby.   History includes non-smoker, CAD s/p RCA stent 05/2003 [facility] by noted) and on 04/2004 [facility]), HTN, HLD, OSA without consistent CPAP use, esophageal reflux, occasional lightheadedness, chronic DOE, neuromuscular disorder (not specified, but on Neurontin), left TKA 05/11/13. Of note, based on his post-operative labs from 05/12/13 his Cr jumped from 1.14 to 3.38--I don't see that this was every addressed or repeated to confirm accuracy. He was discharged home that day.  BUN/Cr are now WNL.  PCP is Dr. Teressa Lower. Cardiologist is Dr. Ninfa Linden [facility]), last visit 10/23/13. CAD was felt stable at that time and six month follow-up was recommended.  (He cleared patient prior to his left TKA in 04/2013; see previously received cardiology records scanned under Media tab, Encounter date 05/11/2013.)   Nuclear stress test on 02/19/13 showed no evidence of ischemia on this study, no diagnostic EKG changes of ischemia, EF 65%.   EKG on 05/01/13 showed NSR, non-specific ST/T wave abnormality. EKG on 12/18/12 [facility]) showed SR, anterolateral ST elevation--repolarization variant, non-specific T wave abnormality.   Event monitor on 02/04/13 [facility]) showed frequent isolated PVC's, rare PAC's, no pauses or bradycardia.   There was no echo done within the past three years at [facility], [facility], [facility], or [facility].   His last cardiac cath was on 04/28/04. A Taxus stent was placed to the mid RCA for 70% stenosis. There was 20% proximal RCA stenosis and 15% proximal DIAG1 stenosis. EF 50-55%.   CXR on 05/01/13 showed no active cardiopulmonary disease.   Preoperative labs noted. Urine culture is still pending.   He underwent similar procedure just over six months ago and had recent cardiology follow-up.  If post-operative labs were accurate, it looks like he sustained acute renal  injury, but now BUN/Cr are back down to normal levels. If no acute changes then I would anticipate that he could proceed as planned. At his PAT appointment, he stated he had not been instructed when to hold his Plavix.  He was going to call Dr. Ruel Favors office. I also left a voice message with Marianna Fuss at his office regarding need for Plavix instructions.    George Hugh [facility] Short Stay Center/Anesthesiology Phone [phone] 11/19/2013 11:13 AM

## 2013-11-29 MED ORDER — CEFAZOLIN SODIUM-DEXTROSE 2-3 GM-% IV SOLR
2.0000 g | INTRAVENOUS | Status: AC
  Administered 2013-11-30: 2 g via INTRAVENOUS
  Filled 2013-11-29: qty 50

## 2013-11-29 MED ORDER — BUPIVACAINE LIPOSOME 1.3 % IJ SUSP
20.0000 mL | Freq: Once | INTRAMUSCULAR | Status: DC
  Filled 2013-11-29: qty 20

## 2013-11-29 MED ORDER — SODIUM CHLORIDE 0.9 % IV SOLN: INTRAVENOUS | Status: DC

## 2013-11-30 ENCOUNTER — Inpatient Hospital Stay (HOSPITAL_COMMUNITY): Payer: PRIVATE HEALTH INSURANCE | Admitting: Anesthesiology

## 2013-11-30 ENCOUNTER — Encounter (HOSPITAL_COMMUNITY)
Admission: RE | Disposition: A | Discharge status: Home Health | Payer: Self-pay | Source: Ambulatory Visit | Attending: Orthopedic Surgery

## 2013-11-30 ENCOUNTER — Encounter (HOSPITAL_COMMUNITY): Payer: Self-pay | Admitting: *Deleted

## 2013-11-30 ENCOUNTER — Encounter (HOSPITAL_COMMUNITY): Payer: PRIVATE HEALTH INSURANCE | Admitting: Vascular Surgery

## 2013-11-30 ENCOUNTER — Inpatient Hospital Stay (HOSPITAL_COMMUNITY)
Admission: RE | Admit: 2013-11-30 | Discharge: 2013-12-01 | DRG: 470 | Disposition: A | Discharge status: Home Health | Payer: PRIVATE HEALTH INSURANCE | Source: Ambulatory Visit | Attending: Orthopedic Surgery | Admitting: Orthopedic Surgery

## 2013-11-30 DIAGNOSIS — E785 Hyperlipidemia, unspecified: Secondary | ICD-10-CM | POA: Diagnosis present

## 2013-11-30 DIAGNOSIS — Z888 Allergy status to other drugs, medicaments and biological substances status: Secondary | ICD-10-CM | POA: Diagnosis not present

## 2013-11-30 DIAGNOSIS — Z7902 Long term (current) use of antithrombotics/antiplatelets: Secondary | ICD-10-CM | POA: Diagnosis not present

## 2013-11-30 DIAGNOSIS — Z96659 Presence of unspecified artificial knee joint: Secondary | ICD-10-CM

## 2013-11-30 DIAGNOSIS — Z7982 Long term (current) use of aspirin: Secondary | ICD-10-CM

## 2013-11-30 DIAGNOSIS — Z79899 Other long term (current) drug therapy: Secondary | ICD-10-CM | POA: Diagnosis not present

## 2013-11-30 DIAGNOSIS — K219 Gastro-esophageal reflux disease without esophagitis: Secondary | ICD-10-CM | POA: Diagnosis present

## 2013-11-30 DIAGNOSIS — G4730 Sleep apnea, unspecified: Secondary | ICD-10-CM | POA: Diagnosis present

## 2013-11-30 DIAGNOSIS — I2510 Atherosclerotic heart disease of native coronary artery without angina pectoris: Secondary | ICD-10-CM | POA: Diagnosis present

## 2013-11-30 DIAGNOSIS — Z9109 Other allergy status, other than to drugs and biological substances: Secondary | ICD-10-CM

## 2013-11-30 DIAGNOSIS — I10 Essential (primary) hypertension: Secondary | ICD-10-CM | POA: Diagnosis present

## 2013-11-30 DIAGNOSIS — Z886 Allergy status to analgesic agent status: Secondary | ICD-10-CM | POA: Diagnosis not present

## 2013-11-30 DIAGNOSIS — D62 Acute posthemorrhagic anemia: Secondary | ICD-10-CM | POA: Diagnosis not present

## 2013-11-30 DIAGNOSIS — M1710 Unilateral primary osteoarthritis, unspecified knee: Secondary | ICD-10-CM | POA: Diagnosis present

## 2013-11-30 HISTORY — PX: TOTAL KNEE ARTHROPLASTY: SHX125

## 2013-11-30 HISTORY — DX: Presence of unspecified artificial knee joint: Z96.659

## 2013-11-30 SURGERY — ARTHROPLASTY, KNEE, TOTAL
Anesthesia: General | Laterality: Right

## 2013-11-30 MED ORDER — ONDANSETRON HCL 4 MG/2ML IJ SOLN
INTRAMUSCULAR | Status: AC
  Filled 2013-11-30: qty 2

## 2013-11-30 MED ORDER — HYDROMORPHONE HCL PF 1 MG/ML IJ SOLN: 0.5000 mg | INTRAMUSCULAR | Status: DC | PRN

## 2013-11-30 MED ORDER — LIDOCAINE HCL (CARDIAC) 20 MG/ML IV SOLN
INTRAVENOUS | Status: AC
  Filled 2013-11-30: qty 5

## 2013-11-30 MED ORDER — 0.9 % SODIUM CHLORIDE (POUR BTL) OPTIME
TOPICAL | Status: DC | PRN
  Administered 2013-11-30: 1000 mL

## 2013-11-30 MED ORDER — LACTATED RINGERS IV SOLN
INTRAVENOUS | Status: DC | PRN
  Administered 2013-11-30 (×3): via INTRAVENOUS

## 2013-11-30 MED ORDER — FENTANYL CITRATE 0.05 MG/ML IJ SOLN
INTRAMUSCULAR | Status: AC
  Filled 2013-11-30: qty 5

## 2013-11-30 MED ORDER — ARTIFICIAL TEARS OP OINT
TOPICAL_OINTMENT | OPHTHALMIC | Status: AC
  Filled 2013-11-30: qty 3.5

## 2013-11-30 MED ORDER — BISACODYL 5 MG PO TBEC: 5.0000 mg | DELAYED_RELEASE_TABLET | Freq: Every day | ORAL | Status: DC | PRN

## 2013-11-30 MED ORDER — HYDROMORPHONE HCL 2 MG PO TABS
2.0000 mg | ORAL_TABLET | ORAL | Status: DC | PRN
  Administered 2013-11-30 – 2013-12-01 (×9): 2 mg via ORAL
  Filled 2013-11-30 (×9): qty 1

## 2013-11-30 MED ORDER — SODIUM CHLORIDE 0.9 % IV SOLN
INTRAVENOUS | Status: DC
  Administered 2013-12-01: via INTRAVENOUS

## 2013-11-30 MED ORDER — SODIUM CHLORIDE 0.9 % IJ SOLN
INTRAMUSCULAR | Status: AC
  Filled 2013-11-30: qty 10

## 2013-11-30 MED ORDER — PROPOFOL 10 MG/ML IV BOLUS
INTRAVENOUS | Status: DC | PRN
  Administered 2013-11-30: 200 mg via INTRAVENOUS

## 2013-11-30 MED ORDER — HYDROMORPHONE HCL PF 1 MG/ML IJ SOLN
INTRAMUSCULAR | Status: AC
  Filled 2013-11-30: qty 1

## 2013-11-30 MED ORDER — BUPIVACAINE-EPINEPHRINE (PF) 0.5% -1:200000 IJ SOLN
INTRAMUSCULAR | Status: AC
  Filled 2013-11-30: qty 30

## 2013-11-30 MED ORDER — MIDAZOLAM HCL 2 MG/2ML IJ SOLN
INTRAMUSCULAR | Status: AC
  Filled 2013-11-30: qty 2

## 2013-11-30 MED ORDER — CLOPIDOGREL BISULFATE 75 MG PO TABS
75.0000 mg | ORAL_TABLET | Freq: Every day | ORAL | Status: DC
  Administered 2013-12-01: 75 mg via ORAL
  Filled 2013-11-30 (×2): qty 1

## 2013-11-30 MED ORDER — ALUM & MAG HYDROXIDE-SIMETH 200-200-20 MG/5ML PO SUSP: 30.0000 mL | ORAL | Status: DC | PRN

## 2013-11-30 MED ORDER — ATORVASTATIN CALCIUM 20 MG PO TABS
20.0000 mg | ORAL_TABLET | Freq: Every day | ORAL | Status: DC
  Administered 2013-11-30 – 2013-12-01 (×2): 20 mg via ORAL
  Filled 2013-11-30 (×2): qty 1

## 2013-11-30 MED ORDER — PHENOL 1.4 % MT LIQD: 1.0000 | OROMUCOSAL | Status: DC | PRN

## 2013-11-30 MED ORDER — FLEET ENEMA 7-19 GM/118ML RE ENEM: 1.0000 | ENEMA | Freq: Once | RECTAL | Status: AC | PRN

## 2013-11-30 MED ORDER — ONDANSETRON HCL 4 MG/2ML IJ SOLN: 4.0000 mg | Freq: Four times a day (QID) | INTRAMUSCULAR | Status: DC | PRN

## 2013-11-30 MED ORDER — BUPIVACAINE LIPOSOME 1.3 % IJ SUSP
INTRAMUSCULAR | Status: DC | PRN
  Administered 2013-11-30: 20 mL

## 2013-11-30 MED ORDER — BUPIVACAINE-EPINEPHRINE 0.5% -1:200000 IJ SOLN
INTRAMUSCULAR | Status: DC | PRN
  Administered 2013-11-30: 30 mL

## 2013-11-30 MED ORDER — ACETAMINOPHEN 650 MG RE SUPP: 650.0000 mg | Freq: Four times a day (QID) | RECTAL | Status: DC | PRN

## 2013-11-30 MED ORDER — ASPIRIN EC 325 MG PO TBEC
325.0000 mg | DELAYED_RELEASE_TABLET | Freq: Every day | ORAL | Status: DC
  Administered 2013-11-30 – 2013-12-01 (×2): 325 mg via ORAL
  Filled 2013-11-30 (×2): qty 1

## 2013-11-30 MED ORDER — EPHEDRINE SULFATE 50 MG/ML IJ SOLN
INTRAMUSCULAR | Status: DC | PRN
  Administered 2013-11-30 (×3): 10 mg via INTRAVENOUS

## 2013-11-30 MED ORDER — AMLODIPINE BESYLATE 2.5 MG PO TABS
2.5000 mg | ORAL_TABLET | Freq: Every day | ORAL | Status: DC
  Administered 2013-11-30 – 2013-12-01 (×2): 2.5 mg via ORAL
  Filled 2013-11-30 (×2): qty 1

## 2013-11-30 MED ORDER — EPHEDRINE SULFATE 50 MG/ML IJ SOLN
INTRAMUSCULAR | Status: AC
  Filled 2013-11-30: qty 1

## 2013-11-30 MED ORDER — COLESEVELAM HCL 625 MG PO TABS
1250.0000 mg | ORAL_TABLET | Freq: Two times a day (BID) | ORAL | Status: DC
  Administered 2013-11-30 – 2013-12-01 (×3): 1250 mg via ORAL
  Filled 2013-11-30 (×4): qty 2

## 2013-11-30 MED ORDER — CARVEDILOL 6.25 MG PO TABS
18.7500 mg | ORAL_TABLET | Freq: Two times a day (BID) | ORAL | Status: DC
  Administered 2013-11-30 – 2013-12-01 (×3): 18.75 mg via ORAL
  Filled 2013-11-30 (×4): qty 1

## 2013-11-30 MED ORDER — CHLORHEXIDINE GLUCONATE 4 % EX LIQD
60.0000 mL | Freq: Once | CUTANEOUS | Status: DC
  Filled 2013-11-30: qty 60

## 2013-11-30 MED ORDER — DEXTROSE 5 % IV SOLN
INTRAVENOUS | Status: DC | PRN
  Administered 2013-11-30: 08:00:00 via INTRAVENOUS

## 2013-11-30 MED ORDER — ONDANSETRON HCL 4 MG/2ML IJ SOLN
INTRAMUSCULAR | Status: DC | PRN
  Administered 2013-11-30: 4 mg via INTRAVENOUS

## 2013-11-30 MED ORDER — GABAPENTIN 400 MG PO CAPS
800.0000 mg | ORAL_CAPSULE | Freq: Two times a day (BID) | ORAL | Status: DC
  Administered 2013-11-30 – 2013-12-01 (×3): 800 mg via ORAL
  Filled 2013-11-30 (×4): qty 2

## 2013-11-30 MED ORDER — MIDAZOLAM HCL 5 MG/5ML IJ SOLN
INTRAMUSCULAR | Status: DC | PRN
  Administered 2013-11-30 (×3): 1 mg via INTRAVENOUS

## 2013-11-30 MED ORDER — ONDANSETRON HCL 4 MG PO TABS
4.0000 mg | ORAL_TABLET | Freq: Four times a day (QID) | ORAL | Status: DC | PRN
Start: 2013-11-30 — End: 2013-12-01

## 2013-11-30 MED ORDER — BUPIVACAINE-EPINEPHRINE (PF) 0.5% -1:200000 IJ SOLN
INTRAMUSCULAR | Status: DC | PRN
  Administered 2013-11-30: 25 mL via PERINEURAL

## 2013-11-30 MED ORDER — SODIUM CHLORIDE 0.9 % IR SOLN
Status: DC | PRN
  Administered 2013-11-30: 1000 mL

## 2013-11-30 MED ORDER — ONDANSETRON HCL 4 MG/2ML IJ SOLN
4.0000 mg | Freq: Once | INTRAMUSCULAR | Status: DC | PRN
Start: 2013-11-30 — End: 2013-11-30

## 2013-11-30 MED ORDER — METHOCARBAMOL 500 MG PO TABS
ORAL_TABLET | ORAL | Status: AC
  Filled 2013-11-30: qty 1

## 2013-11-30 MED ORDER — CEFAZOLIN SODIUM-DEXTROSE 2-3 GM-% IV SOLR
2.0000 g | Freq: Four times a day (QID) | INTRAVENOUS | Status: AC
  Administered 2013-11-30 (×2): 2 g via INTRAVENOUS
  Filled 2013-11-30 (×2): qty 50

## 2013-11-30 MED ORDER — TRAMADOL HCL 50 MG PO TABS: 50.0000 mg | ORAL_TABLET | Freq: Four times a day (QID) | ORAL | Status: DC | PRN

## 2013-11-30 MED ORDER — DOCUSATE SODIUM 100 MG PO CAPS
100.0000 mg | ORAL_CAPSULE | Freq: Two times a day (BID) | ORAL | Status: DC
  Administered 2013-11-30 – 2013-12-01 (×2): 100 mg via ORAL
  Filled 2013-11-30 (×2): qty 1

## 2013-11-30 MED ORDER — ZOLPIDEM TARTRATE 5 MG PO TABS
5.0000 mg | ORAL_TABLET | Freq: Every evening | ORAL | Status: DC | PRN
Start: 2013-11-30 — End: 2013-12-01

## 2013-11-30 MED ORDER — HYDROMORPHONE HCL PF 1 MG/ML IJ SOLN
0.2500 mg | INTRAMUSCULAR | Status: DC | PRN
  Administered 2013-11-30 (×4): 0.5 mg via INTRAVENOUS

## 2013-11-30 MED ORDER — CELECOXIB 200 MG PO CAPS
200.0000 mg | ORAL_CAPSULE | Freq: Two times a day (BID) | ORAL | Status: DC
  Administered 2013-12-01: 200 mg via ORAL
  Filled 2013-11-30 (×4): qty 1

## 2013-11-30 MED ORDER — EZETIMIBE 10 MG PO TABS
10.0000 mg | ORAL_TABLET | Freq: Every day | ORAL | Status: DC
  Administered 2013-11-30 – 2013-12-01 (×2): 10 mg via ORAL
  Filled 2013-11-30 (×2): qty 1

## 2013-11-30 MED ORDER — SENNOSIDES-DOCUSATE SODIUM 8.6-50 MG PO TABS: 1.0000 | ORAL_TABLET | Freq: Every evening | ORAL | Status: DC | PRN

## 2013-11-30 MED ORDER — PROPOFOL 10 MG/ML IV BOLUS
INTRAVENOUS | Status: AC
  Filled 2013-11-30: qty 20

## 2013-11-30 MED ORDER — LOSARTAN POTASSIUM 50 MG PO TABS
50.0000 mg | ORAL_TABLET | Freq: Every day | ORAL | Status: DC
  Administered 2013-11-30 – 2013-12-01 (×2): 50 mg via ORAL
  Filled 2013-11-30 (×2): qty 1

## 2013-11-30 MED ORDER — FENTANYL CITRATE 0.05 MG/ML IJ SOLN
INTRAMUSCULAR | Status: DC | PRN
  Administered 2013-11-30: 50 ug via INTRAVENOUS
  Administered 2013-11-30: 75 ug via INTRAVENOUS
  Administered 2013-11-30: 50 ug via INTRAVENOUS

## 2013-11-30 MED ORDER — ACETAMINOPHEN 325 MG PO TABS: 650.0000 mg | ORAL_TABLET | Freq: Four times a day (QID) | ORAL | Status: DC | PRN

## 2013-11-30 MED ORDER — TESTOSTERONE CYPIONATE 200 MG/ML IM SOLN: 300.0000 mg | INTRAMUSCULAR | Status: DC

## 2013-11-30 MED ORDER — HYDROMORPHONE HCL PF 1 MG/ML IJ SOLN
1.0000 mg | INTRAMUSCULAR | Status: DC | PRN
  Administered 2013-11-30 – 2013-12-01 (×8): 1 mg via INTRAVENOUS
  Filled 2013-11-30 (×9): qty 1

## 2013-11-30 MED ORDER — METHOCARBAMOL 500 MG PO TABS
500.0000 mg | ORAL_TABLET | Freq: Four times a day (QID) | ORAL | Status: DC | PRN
  Administered 2013-11-30 – 2013-12-01 (×4): 500 mg via ORAL
  Filled 2013-11-30 (×3): qty 1

## 2013-11-30 MED ORDER — OXYCODONE HCL 5 MG PO TABS: 5.0000 mg | ORAL_TABLET | Freq: Once | ORAL | Status: DC | PRN

## 2013-11-30 MED ORDER — MENTHOL 3 MG MT LOZG: 1.0000 | LOZENGE | OROMUCOSAL | Status: DC | PRN

## 2013-11-30 MED ORDER — LIDOCAINE HCL (CARDIAC) 20 MG/ML IV SOLN
INTRAVENOUS | Status: DC | PRN
  Administered 2013-11-30: 90 mg via INTRAVENOUS

## 2013-11-30 MED ORDER — METOCLOPRAMIDE HCL 5 MG/ML IJ SOLN: 5.0000 mg | Freq: Three times a day (TID) | INTRAMUSCULAR | Status: DC | PRN

## 2013-11-30 MED ORDER — ARTIFICIAL TEARS OP OINT
TOPICAL_OINTMENT | OPHTHALMIC | Status: DC | PRN
  Administered 2013-11-30: 1 via OPHTHALMIC

## 2013-11-30 MED ORDER — METOCLOPRAMIDE HCL 10 MG PO TABS: 5.0000 mg | ORAL_TABLET | Freq: Three times a day (TID) | ORAL | Status: DC | PRN

## 2013-11-30 MED ORDER — OXYCODONE HCL 5 MG/5ML PO SOLN: 5.0000 mg | Freq: Once | ORAL | Status: DC | PRN

## 2013-11-30 MED ORDER — DEXTROSE 5 % IV SOLN
500.0000 mg | Freq: Four times a day (QID) | INTRAVENOUS | Status: DC | PRN
  Filled 2013-11-30: qty 5

## 2013-11-30 MED ORDER — DIPHENHYDRAMINE HCL 12.5 MG/5ML PO ELIX
12.5000 mg | ORAL_SOLUTION | ORAL | Status: DC | PRN
  Administered 2013-11-30 – 2013-12-01 (×2): 25 mg via ORAL
  Filled 2013-11-30 (×4): qty 10

## 2013-11-30 SURGICAL SUPPLY — 57 items
BANDAGE ESMARK 6X9 LF (GAUZE/BANDAGES/DRESSINGS) ×1 IMPLANT
BLADE PATELLA REAM PILOT HOLE (BLADE) ×2 IMPLANT
BLADE SAGITTAL 13X1.27X60 (BLADE) ×2 IMPLANT
BLADE SAGITTAL 13X1.27X60MM (BLADE) ×1
BLADE SAW SGTL 83.5X18.5 (BLADE) ×3 IMPLANT
BNDG CMPR 9X6 STRL LF SNTH (GAUZE/BANDAGES/DRESSINGS) ×1
BNDG ESMARK 6X9 LF (GAUZE/BANDAGES/DRESSINGS) ×3
BOWL SMART MIX CTS (DISPOSABLE) ×3 IMPLANT
CAP POR NKTM CP VIT E LN CER ×2 IMPLANT
CEMENT BONE SIMPLEX SPEEDSET (Cement) ×6 IMPLANT
COVER SURGICAL LIGHT HANDLE (MISCELLANEOUS) ×3 IMPLANT
CUFF TOURNIQUET SINGLE 34IN LL (TOURNIQUET CUFF) ×3 IMPLANT
DRAPE EXTREMITY T 121X128X90 (DRAPE) ×3 IMPLANT
DRAPE INCISE IOBAN 66X45 STRL (DRAPES) ×6 IMPLANT
DRAPE PROXIMA HALF (DRAPES) ×3 IMPLANT
DRAPE U-SHAPE 47X51 STRL (DRAPES) ×3 IMPLANT
DRSG ADAPTIC 3X8 NADH LF (GAUZE/BANDAGES/DRESSINGS) ×3 IMPLANT
DRSG PAD ABDOMINAL 8X10 ST (GAUZE/BANDAGES/DRESSINGS) ×3 IMPLANT
DURAPREP 26ML APPLICATOR (WOUND CARE) ×6 IMPLANT
ELECT REM PT RETURN 9FT ADLT (ELECTROSURGICAL) ×3
ELECTRODE REM PT RTRN 9FT ADLT (ELECTROSURGICAL) ×1 IMPLANT
EVACUATOR 1/8 PVC DRAIN (DRAIN) ×3 IMPLANT
GAUZE SPONGE 4X4 12PLY STRL (GAUZE/BANDAGES/DRESSINGS) ×3 IMPLANT
GLOVE BIOGEL M 7.0 STRL (GLOVE) IMPLANT
GLOVE BIOGEL PI IND STRL 7.5 (GLOVE) IMPLANT
GLOVE BIOGEL PI IND STRL 8.5 (GLOVE) ×2 IMPLANT
GLOVE BIOGEL PI INDICATOR 7.5 (GLOVE)
GLOVE BIOGEL PI INDICATOR 8.5 (GLOVE) ×4
GLOVE SURG ORTHO 8.0 STRL STRW (GLOVE) ×8 IMPLANT
GOWN STRL REUS W/ TWL LRG LVL3 (GOWN DISPOSABLE) ×2 IMPLANT
GOWN STRL REUS W/ TWL XL LVL3 (GOWN DISPOSABLE) ×2 IMPLANT
GOWN STRL REUS W/TWL LRG LVL3 (GOWN DISPOSABLE) ×3
GOWN STRL REUS W/TWL XL LVL3 (GOWN DISPOSABLE) ×6
HANDPIECE INTERPULSE COAX TIP (DISPOSABLE) ×3
HOOD PEEL AWAY FACE SHEILD DIS (HOOD) ×12 IMPLANT
KIT BASIN OR (CUSTOM PROCEDURE TRAY) ×3 IMPLANT
KIT ROOM TURNOVER OR (KITS) ×3 IMPLANT
MANIFOLD NEPTUNE II (INSTRUMENTS) ×3 IMPLANT
NEEDLE 22X1 1/2 (OR ONLY) (NEEDLE) ×6 IMPLANT
NS IRRIG 1000ML POUR BTL (IV SOLUTION) ×3 IMPLANT
PACK TOTAL JOINT (CUSTOM PROCEDURE TRAY) ×3 IMPLANT
PAD ARMBOARD 7.5X6 YLW CONV (MISCELLANEOUS) ×6 IMPLANT
PADDING CAST COTTON 6X4 STRL (CAST SUPPLIES) ×3 IMPLANT
SET HNDPC FAN SPRY TIP SCT (DISPOSABLE) ×1 IMPLANT
STAPLER VISISTAT 35W (STAPLE) ×3 IMPLANT
SUCTION FRAZIER TIP 10 FR DISP (SUCTIONS) ×3 IMPLANT
SUT BONE WAX W31G (SUTURE) ×3 IMPLANT
SUT VIC AB 0 CTB1 27 (SUTURE) ×6 IMPLANT
SUT VIC AB 1 CT1 27 (SUTURE) ×6
SUT VIC AB 1 CT1 27XBRD ANBCTR (SUTURE) ×2 IMPLANT
SUT VIC AB 2-0 CT1 27 (SUTURE) ×6
SUT VIC AB 2-0 CT1 TAPERPNT 27 (SUTURE) ×2 IMPLANT
SYR 20CC LL (SYRINGE) ×6 IMPLANT
TOWEL OR 17X24 6PK STRL BLUE (TOWEL DISPOSABLE) ×3 IMPLANT
TOWEL OR 17X26 10 PK STRL BLUE (TOWEL DISPOSABLE) ×3 IMPLANT
TRAY FOLEY CATH 14FR (SET/KITS/TRAYS/PACK) ×1 IMPLANT
WATER STERILE IRR 1000ML POUR (IV SOLUTION) ×2 IMPLANT

## 2013-11-30 NOTE — Anesthesia Preprocedure Evaluation (Addendum)
Anesthesia Evaluation  Patient identified by MRN, date of birth, ID band Patient awake    Reviewed: Allergy & Precautions, H&P , NPO status , Patient's Chart, lab work & pertinent test results, reviewed documented beta blocker date and time   Airway Mallampati: I TM Distance: >3 FB Neck ROM: Full    Dental  (+) Teeth Intact, Dental Advisory Given, Caps   Pulmonary sleep apnea and Continuous Positive Airway Pressure Ventilation ,  Erratic use of CPAP breath sounds clear to auscultation        Cardiovascular hypertension, Pt. on medications and Pt. on home beta blockers + CAD Rhythm:Regular Rate:Normal     Neuro/Psych    GI/Hepatic GERD-  Medicated and Controlled,  Endo/Other    Renal/GU      Musculoskeletal   Abdominal   Peds  Hematology   Anesthesia Other Findings   Reproductive/Obstetrics                         Anesthesia Physical Anesthesia Plan  ASA: III  Anesthesia Plan: General   Post-op Pain Management:    Induction: Intravenous  Airway Management Planned: LMA  Additional Equipment:   Intra-op Plan:   Post-operative Plan: Extubation in OR  Informed Consent: I have reviewed the patients History and Physical, chart, labs and discussed the procedure including the risks, benefits and alternatives for the proposed anesthesia with the patient or authorized representative who has indicated his/her understanding and acceptance.   Dental advisory given  Plan Discussed with: CRNA, Anesthesiologist and Surgeon  Anesthesia Plan Comments:         Anesthesia Quick Evaluation

## 2013-11-30 NOTE — Progress Notes (Signed)
Orthopedic Tech Progress Note Patient Details:  Clarence Schneider February 17, 1951 [id number]  CPM Right Knee CPM Right Knee: On Right Knee Flexion (Degrees): 90 Right Knee Extension (Degrees): 0 Additional Comments: Trapeze bar and foot roll   [name], [name] 11/30/2013, 11:03 AM

## 2013-11-30 NOTE — Anesthesia Postprocedure Evaluation (Signed)
  Anesthesia Post-op Note  Patient: Clarence Schneider  Procedure(s) Performed: Procedure(s): TOTAL KNEE ARTHROPLASTY (Right)  Patient Location: PACU  Anesthesia Type: General   Level of Consciousness: awake, alert  and oriented  Airway and Oxygen Therapy: Patient Spontanous Breathing  Post-op Pain: mild  Post-op Assessment: Post-op Vital signs reviewed  Post-op Vital Signs: Reviewed  Last Vitals:  Filed Vitals:   11/30/13 1027  BP: 170/123  Pulse: 60  Temp:   Resp: 14    Complications: No apparent anesthesia complications

## 2013-11-30 NOTE — Anesthesia Procedure Notes (Addendum)
Anesthesia Regional Block:  Adductor canal block  Pre-Anesthetic Checklist: ,, timeout performed, Correct Patient, Correct Site, Correct Laterality, Correct Procedure, Correct Position, site marked, Risks and benefits discussed,  Surgical consent,  Pre-op evaluation,  At surgeon's request and post-op pain management  Laterality: Right and Lower  Prep: chloraprep       Needles:  Injection technique: Single-shot  Needle Type: Echogenic Needle     Needle Length: 9cm 9 cm Needle Gauge: 21 and 21 G    Additional Needles:  Procedures: ultrasound guided (picture in chart) Adductor canal block Narrative:  Start time: 11/30/2013 7:10 AM End time: 11/30/2013 7:18 AM Injection made incrementally with aspirations every 5 mL.  Performed by: Personally  Anesthesiologist: Lorrene Reid, MD   Procedure Name: LMA Insertion Date/Time: 11/30/2013 7:47 AM Performed by: Terrill Mohr Pre-anesthesia Checklist: Patient identified, Emergency Drugs available, Suction available and Patient being monitored Patient Re-evaluated:Patient Re-evaluated prior to inductionOxygen Delivery Method: Circle system utilized Preoxygenation: Pre-oxygenation with 100% oxygen Intubation Type: IV induction Ventilation: Mask ventilation without difficulty LMA: LMA inserted LMA Size: 5.0 Number of attempts: 1 Placement Confirmation: breath sounds checked- equal and bilateral and positive ETCO2 Tube secured with: taped across cheeks. Dental Injury: Teeth and Oropharynx as per pre-operative assessment

## 2013-11-30 NOTE — Transfer of Care (Signed)
Immediate Anesthesia Transfer of Care Note  Patient: Clarence Schneider  Procedure(s) Performed: Procedure(s): TOTAL KNEE ARTHROPLASTY (Right)  Patient Location: PACU  Anesthesia Type:General  Level of Consciousness: awake, sedated and responds to stimulation  Airway & Oxygen Therapy: Patient Spontanous Breathing and Patient connected to nasal cannula oxygen  Post-op Assessment: Report given to PACU RN and Post -op Vital signs reviewed and stable  Post vital signs: Reviewed and stable  Complications: No apparent anesthesia complications

## 2013-11-30 NOTE — H&P (Signed)
Clarence Schneider MRN:  [id number] DOB/SEX:  Feb 03, 1951/male  CHIEF COMPLAINT:  Painful right Knee  HISTORY: Patient is a 63 y.o. male presented with a history of pain in the right knee. Onset of symptoms was gradual starting several years ago with gradually worsening course since that time. Prior procedures on the knee include none. Patient has been treated conservatively with over-the-counter NSAIDs and activity modification. Patient currently rates pain in the knee at 9 out of 10 with activity. There is no pain at night.  PAST MEDICAL HISTORY: Patient Active Problem List   Diagnosis Date Noted  . 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 Esophageal reflux   . Hypertension   . Shortness of breath     with exertion  . Headache(784.0)   . Dizziness   . Lightheadedness     doesn't occur everyday; happens at times; once occured while walking up a incline  . Pinched nerve     in back  . Arthritis   . Neuromuscular disorder     Takes Gabapentin  . Hyperlipemia   . Skin abrasion     under both arms; uses Lotrisone cream twice a day  . Coronary artery disease     stents  . Sleep apnea     cpap doesnt use reg   Past Surgical History  Procedure Laterality Date  . Tonsillectomy    . Appendectomy    . Inguinal hernia repair Right   . Carpal tunnel release Bilateral   . Shoulder arthroscopy Left   . Knee arthroscopy Bilateral     Left x 1 Right X 2  . Cardiac catheterization      X 3 stents  . Colonoscopy w/ polypectomy    . Total knee arthroplasty Left 05/11/2013    Procedure: LEFT TOTAL KNEE ARTHROPLASTY;  Surgeon: Vickey Huger, MD;  Location: [facility];  Service: Orthopedics;  Laterality: Left;  . Elbow arthroscopy Bilateral     ulner nerve   2 surgeries     MEDICATIONS:   Prescriptions prior to admission  Medication Sig Dispense Refill  . amLODipine (NORVASC) 5 MG tablet Take 2.5 mg by mouth daily.      Marland Kitchen aspirin 325 MG EC tablet  Take 325 mg by mouth daily.      . carvedilol (COREG) 12.5 MG tablet Take 18.75 mg by mouth 2 (two) times daily with a meal. 1.5 tablets      . clopidogrel (PLAVIX) 75 MG tablet Take 75 mg by mouth daily with breakfast.      . colesevelam (WELCHOL) 625 MG tablet Take 1,250 mg by mouth 2 (two) times daily with a meal.      . ezetimibe (ZETIA) 10 MG tablet Take 10 mg by mouth daily.      Marland Kitchen gabapentin (NEURONTIN) 400 MG capsule Take 800 mg by mouth 2 (two) times daily.      Marland Kitchen losartan (COZAAR) 100 MG tablet Take 50 mg by mouth daily.       . meloxicam (MOBIC) 15 MG tablet Take 15 mg by mouth daily.      . methocarbamol (ROBAXIN) 500 MG tablet Take 500-1,000 mg by mouth every 6 (six) hours as needed for muscle spasms.      . Multiple Vitamins-Minerals (MULTIVITAMIN WITH MINERALS) tablet Take 1 tablet by mouth daily.      Marland Kitchen omega-3 acid ethyl esters (LOVAZA) 1 G capsule Take 1 g by mouth 2 (two) times  daily.       Marland Kitchen pyridoxine (B-6) 200 MG tablet Take 200 mg by mouth daily.      . rosuvastatin (CRESTOR) 10 MG tablet Take 15 mg by mouth daily.      Marland Kitchen testosterone cypionate (DEPOTESTOTERONE CYPIONATE) 200 MG/ML injection Inject 300 mg into the muscle every 14 (fourteen) days. 1.5 ml (300 mg) every 2 weeks      . traMADol (ULTRAM) 50 MG tablet Take 50 mg by mouth 3 (three) times daily as needed for moderate pain.      . vitamin B-12 (CYANOCOBALAMIN) 1000 MCG tablet Take 1,000 mcg by mouth daily.      . vitamin C (ASCORBIC ACID) 500 MG tablet Take 500 mg by mouth daily.      . vitamin E 400 UNIT capsule Take 400 Units by mouth daily.        ALLERGIES:   Allergies  Allergen Reactions  . Hydrocodone-Acetaminophen Other (See Comments)    Hallucinations   . Oxycodone Itching  . Reglan [Metoclopramide]     Makes me act funny  . Tape Rash    "paper tape and Electrodes breaks me out"    REVIEW OF SYSTEMS:  Pertinent items are noted in HPI.   FAMILY HISTORY:  History reviewed. No pertinent family  history.  SOCIAL HISTORY:   History  Substance Use Topics  . Smoking status: Never Smoker   . Smokeless tobacco: Not on file  . Alcohol Use: No     EXAMINATION:  Vital signs in last 24 hours: Temp:  [98.6 F (37 C)] 98.6 F (37 C) (08/17 0616) Pulse Rate:  [63] 63 (08/17 0616) Resp:  [20] 20 (08/17 0616) BP: (159)/(87) 159/87 mmHg (08/17 0616) SpO2:  [99 %] 99 % (08/17 0616) Weight:  [91.2 kg (201 lb 1 oz)] 91.2 kg (201 lb 1 oz) (08/17 0616)  General appearance: alert, cooperative and no distress Lungs: clear to auscultation bilaterally Heart: regular rate and rhythm, S1, S2 normal, no murmur, click, rub or gallop Abdomen: soft, non-tender; bowel sounds normal; no masses,  no organomegaly Extremities: extremities normal, atraumatic, no cyanosis or edema and Homans sign is negative, no sign of DVT Pulses: 2+ and symmetric Skin: Skin color, texture, turgor normal. No rashes or lesions Neurologic: Alert and oriented X 3, normal strength and tone. Normal symmetric reflexes. Normal coordination and gait  Musculoskeletal:  ROM 0-110, Ligaments intact,  Imaging Review Plain radiographs demonstrate severe degenerative joint disease of the right knee. The overall alignment is significant valgus. The bone quality appears to be good for age and reported activity level.  Assessment/Plan: End stage arthritis, right knee   The patient history, physical examination and imaging studies are consistent with advanced degenerative joint disease of the right knee. The patient has failed conservative treatment.  The clearance notes were reviewed.  After discussion with the patient it was felt that Total Knee Replacement was indicated. The procedure,  risks, and benefits of total knee arthroplasty were presented and reviewed. The risks including but not limited to aseptic loosening, infection, blood clots, vascular injury, stiffness, patella tracking problems complications among others were discussed.  The patient acknowledged the explanation, agreed to proceed with the plan.  Clarence Schneider 11/30/2013, 6:43 AM

## 2013-11-30 NOTE — Plan of Care (Signed)
Problem: Consults Goal: Diagnosis- Total Joint Replacement Primary Total Knee right     

## 2013-11-30 NOTE — Progress Notes (Signed)
Utilization review completed.  

## 2013-11-30 NOTE — Care Management Note (Signed)
CARE MANAGEMENT NOTE 11/30/2013  Patient:  Clarence Schneider, Clarence Schneider   Account Number:  [id number]  Date Initiated:  11/30/2013  Documentation initiated by:  Ricki Miller  Subjective/Objective Assessment:   63 yr old male s/p right total knee arthroplasty. Patient had left knee replacement January 2015.     Action/Plan:   Case manager spoke with patient concerning home health and DME needs at discharge. Preoperatively setup with [facility], no changes. Has RW and 3in1. Has family support at discharge.   Anticipated DC Date:  12/01/2013   Anticipated DC Plan:  [place]  CM consult      [facility] Choice  HOME HEALTH  DURABLE MEDICAL EQUIPMENT   Choice offered to / List presented to:  C-1 Patient   DME arranged  CPM      DME agency  [employer]     [place] arranged  HH-2 PT      [place]   Status of service:  Completed, signed off Medicare Important Message given?   (If response is "NO", the following Medicare IM given date fields will be blank) Date Medicare IM given:   Medicare IM given by:   Date Additional Medicare IM given:   Additional Medicare IM given by:    Discharge Disposition:  [place]

## 2013-11-30 NOTE — Evaluation (Signed)
Physical Therapy Evaluation Patient Details Name: GUISEPPE FLANAGAN MRN: [id number] DOB: Jan 20, 1951 Today's Date: 11/30/2013   History of Present Illness  Pt s/p elective R TKA 8/17. Pt s/p L TKA January 2015.  Clinical Impression  Pt is s/p TKA resulting in the deficits listed below (see PT Problem List). Pt with excessive bleeding from surgical knee, unclear if from drain or surgical site due to bandage. RN and PA aware. Pt did tolerate OOB mobility well for first time. Pt will benefit from skilled PT to increase their independence and safety with mobility to allow discharge to the venue listed below.      Follow Up Recommendations Home health PT;Supervision/Assistance - 24 hour    Equipment Recommendations  None recommended by PT    Recommendations for Other Services       Precautions / Restrictions Precautions Precautions: Knee Precaution Booklet Issued:  (exercise hand out) Restrictions Weight Bearing Restrictions: Yes      Mobility  Bed Mobility Overal bed mobility: Modified Independent             General bed mobility comments: HOB elevated, pt able to manage bilat LEs  Transfers Overall transfer level: Needs assistance Equipment used: Rolling walker (2 wheeled) Transfers: Sit to/from Stand Sit to Stand: Min guard         General transfer comment: v/c's to slow down  Ambulation/Gait Ambulation/Gait assistance: Min guard Ambulation Distance (Feet): 2 Feet Assistive device: Rolling walker (2 wheeled) Gait Pattern/deviations: Step-to pattern Gait velocity: wfl for surgery   General Gait Details: pt with minimal R LE WBing this date, no R knee buckling, pt with good sequencing and increased bilat UE WBing  Stairs            Wheelchair Mobility    Modified Rankin (Stroke Patients Only)       Balance                                             Pertinent Vitals/Pain Pain Assessment: 0-10 Pain Score: 5  Pain Location:  R knee Pain Intervention(s): Premedicated before session    Home Living Family/patient expects to be discharged to:: Private residence Living Arrangements: Spouse/significant other Available Help at Discharge: Family;Available 24 hours/day Type of Home: House Home Access: Stairs to enter Entrance Stairs-Rails: Right Entrance Stairs-Number of Steps: 1 Home Layout: Two level Home Equipment: Walker - 2 wheels;Bedside commode      Prior Function Level of Independence: Independent               Hand Dominance   Dominant Hand: Right    Extremity/Trunk Assessment   Upper Extremity Assessment: Overall WFL for tasks assessed           Lower Extremity Assessment: RLE deficits/detail RLE Deficits / Details: pt unable to achieve quad set    Cervical / Trunk Assessment: Normal  Communication   Communication: No difficulties  Cognition Arousal/Alertness: Awake/alert Behavior During Therapy: WFL for tasks assessed/performed Overall Cognitive Status: Within Functional Limits for tasks assessed                      General Comments      Exercises Total Joint Exercises Ankle Circles/Pumps: AROM;Both;10 reps Quad Sets: AROM;Right;10 reps Goniometric ROM: limited R knee flexion due to bleeding      Assessment/Plan    PT Assessment Patient  needs continued PT services  PT Diagnosis Difficulty walking;Acute pain   PT Problem List Decreased strength;Decreased activity tolerance;Decreased balance;Decreased mobility;Decreased range of motion  PT Treatment Interventions DME instruction;Gait training;Stair training;Functional mobility training;Therapeutic activities;Therapeutic exercise   PT Goals (Current goals can be found in the Care Plan section) Acute Rehab PT Goals Patient Stated Goal: not stated PT Goal Formulation: With patient Time For Goal Achievement: 12/07/13 Potential to Achieve Goals: Good    Frequency 7X/week   Barriers to discharge         Co-evaluation               End of Session Equipment Utilized During Treatment: Gait belt Activity Tolerance: Patient tolerated treatment well Patient left: in chair;with call bell/phone within reach;with family/visitor present Nurse Communication: Mobility status         Time: 8366-2947 PT Time Calculation (min): 26 min   Charges:   PT Evaluation $Initial PT Evaluation Tier I: 1 Procedure PT Treatments $Therapeutic Activity: 8-22 mins   PT G Codes:          Kingsley Callander 11/30/2013, 2:15 PM  Kittie Plater, PT, DPT Pager #: [phone] Office #: [phone]

## 2013-12-01 ENCOUNTER — Encounter (HOSPITAL_COMMUNITY): Payer: Self-pay | Admitting: General Practice

## 2013-12-01 LAB — BASIC METABOLIC PANEL
ANION GAP: 7 (ref 5–15)
BUN: 10 mg/dL (ref 6–23)
CHLORIDE: 101 meq/L (ref 96–112)
CO2: 28 mEq/L (ref 19–32)
Calcium: 7.7 mg/dL — ABNORMAL LOW (ref 8.4–10.5)
Creatinine, Ser: 1.48 mg/dL — ABNORMAL HIGH (ref 0.50–1.35)
GFR calc non Af Amer: 49 mL/min — ABNORMAL LOW (ref >90)
GFR, EST AFRICAN AMERICAN: 57 mL/min — AB (ref >90)
Glucose, Bld: 120 mg/dL — ABNORMAL HIGH (ref 70–99)
Potassium: 4.8 mEq/L (ref 3.7–5.3)
SODIUM: 136 meq/L — AB (ref 137–147)

## 2013-12-01 LAB — CBC
HEMATOCRIT: 32.7 % — AB (ref 39.0–52.0)
Hemoglobin: 10.8 g/dL — ABNORMAL LOW (ref 13.0–17.0)
MCH: 27.6 pg (ref 26.0–34.0)
MCHC: 33 g/dL (ref 30.0–36.0)
MCV: 83.6 fL (ref 78.0–100.0)
Platelets: 167 10*3/uL (ref 150–400)
RBC: 3.91 MIL/uL — ABNORMAL LOW (ref 4.22–5.81)
RDW: 18.4 % — AB (ref 11.5–15.5)
WBC: 11.9 10*3/uL — AB (ref 4.0–10.5)

## 2013-12-01 MED ORDER — OXYCODONE HCL ER 10 MG PO T12A: 10.0000 mg | EXTENDED_RELEASE_TABLET | Freq: Two times a day (BID) | ORAL | Status: DC

## 2013-12-01 MED ORDER — OXYCODONE HCL 5 MG PO TABS: 5.0000 mg | ORAL_TABLET | ORAL | Status: DC | PRN

## 2013-12-01 MED ORDER — METHOCARBAMOL 500 MG PO TABS: 500.0000 mg | ORAL_TABLET | Freq: Four times a day (QID) | ORAL | Status: DC | PRN

## 2013-12-01 MED ORDER — OXYCODONE HCL 5 MG PO TABS
5.0000 mg | ORAL_TABLET | ORAL | Status: DC | PRN
  Administered 2013-12-01 (×3): 10 mg via ORAL
  Filled 2013-12-01 (×3): qty 2

## 2013-12-01 MED ORDER — DIPHENHYDRAMINE HCL 50 MG PO TABS: 50.0000 mg | ORAL_TABLET | Freq: Four times a day (QID) | ORAL | Status: DC | PRN

## 2013-12-01 MED ORDER — OXYCODONE HCL ER 10 MG PO T12A
10.0000 mg | EXTENDED_RELEASE_TABLET | Freq: Two times a day (BID) | ORAL | Status: DC
  Administered 2013-12-01: 10 mg via ORAL
  Filled 2013-12-01: qty 1

## 2013-12-01 NOTE — Progress Notes (Signed)
Physical Therapy Treatment Patient Details Name: Clarence Schneider MRN: [id number] DOB: 01/16/1951 Today's Date: 12/01/2013    History of Present Illness Pt s/p elective R TKA 8/17. Pt s/p L TKA January 2015.    PT Comments    Patient moving better this afternoon and was able to practice steps. Patient stated he feels safe and ready to DC home today. Patient familiar with techniques and safety as he had previous knee surgery in Jan  Follow Up Recommendations  Home health PT;Supervision/Assistance - 24 hour     Equipment Recommendations  None recommended by PT    Recommendations for Other Services       Precautions / Restrictions Precautions Precautions: Knee Precaution Comments: reviewed precautions Restrictions Weight Bearing Restrictions: Yes LLE Weight Bearing: Weight bearing as tolerated    Mobility  Bed Mobility Overal bed mobility: Needs Assistance Bed Mobility: Supine to Sit     Supine to sit: Min assist     General bed mobility comments: not assessed - pt in recliner  Transfers Overall transfer level: Needs assistance Equipment used: Rolling walker (2 wheeled) Transfers: Sit to/from Stand Sit to Stand: Min guard         General transfer comment: MinGuard for safety. Patient with proper technique  Ambulation/Gait Ambulation/Gait assistance: Min guard Ambulation Distance (Feet): 40 Feet Assistive device: Rolling walker (2 wheeled) Gait Pattern/deviations: Step-through pattern;Decreased stride length Gait velocity: decreased   General Gait Details: Patient starting to ambulate with step through pattern.    Stairs Stairs: Yes Stairs assistance: Min guard Stair Management: Forwards;Step to pattern;No rails;With walker Number of Stairs: 2 General stair comments: Patient practiced one step twice with cues   Wheelchair Mobility    Modified Rankin (Stroke Patients Only)       Balance Overall balance assessment: Needs  assistance Sitting-balance support: Feet supported;No upper extremity supported Sitting balance-Leahy Scale: Fair     Standing balance support: Bilateral upper extremity supported;During functional activity Standing balance-Leahy Scale: Poor                      Cognition Arousal/Alertness: Awake/alert Behavior During Therapy: WFL for tasks assessed/performed Overall Cognitive Status: Within Functional Limits for tasks assessed                      Exercises Total Joint Exercises Quad Sets: AROM;Right;10 reps Heel Slides: AAROM;Right;10 reps Hip ABduction/ADduction: AAROM;Right;10 reps Straight Leg Raises: AAROM;Right;10 reps    General Comments        Pertinent Vitals/Pain Pain Assessment: 0-10 Pain Score: 3  Pain Location: R knee Pain Descriptors / Indicators: Constant Pain Intervention(s): Monitored during session    Home Living Family/patient expects to be discharged to:: Private residence Living Arrangements: Spouse/significant other;Children Available Help at Discharge: Family;Available 24 hours/day Type of Home: House Home Access: Stairs to enter Entrance Stairs-Rails: Right Home Layout: Two level Home Equipment: Walker - 2 wheels;Bedside commode      Prior Function Level of Independence: Independent          PT Goals (current goals can now be found in the care plan section) Acute Rehab PT Goals Patient Stated Goal: not stated Progress towards PT goals: Progressing toward goals    Frequency  7X/week    PT Plan Current plan remains appropriate    Co-evaluation             End of Session Equipment Utilized During Treatment: Gait belt Activity Tolerance: Patient tolerated treatment well Patient left:  in chair;with call bell/phone within reach     Time: 1434-1500 PT Time Calculation (min): 26 min  Charges:  $Gait Training: 8-22 mins $Therapeutic Exercise: 8-22 mins                    G Codes:      Jacqualyn Posey 12/01/2013, 3:17 PM 12/01/2013 Jacqualyn Posey PTA [phone] pager [phone] office

## 2013-12-01 NOTE — Evaluation (Signed)
Occupational Therapy Evaluation Patient Details Name: Clarence Schneider MRN: [id number] DOB: 1950-05-20 Today's Date: 12/01/2013    History of Present Illness Pt s/p elective R TKA 8/17. Pt s/p L TKA January 2015.   Clinical Impression   Pt admitted with the above diagnoses and presents with below problem list. Pt will benefit from continued acute OT to address the below listed deficits and maximize independence with basic ADLs prior to d/c home with family. PTA pt was independent with ADLs. Pt currently needing min A for LB ADLs. Pt indicated he has family to provide 24 hour supervision/assistance at home. ADL education provided.     Follow Up Recommendations  Supervision/Assistance - 24 hour;No OT follow up    Equipment Recommendations  Other (comment) (Pt has recommended DME)    Recommendations for Other Services       Precautions / Restrictions Precautions Precautions: Knee Precaution Comments: reviewed precautions Restrictions Weight Bearing Restrictions: Yes LLE Weight Bearing: Weight bearing as tolerated      Mobility Bed Mobility Overal bed mobility: Needs Assistance Bed Mobility: Supine to Sit     Supine to sit: Min assist     General bed mobility comments: not assessed - pt in recliner  Transfers Overall transfer level: Needs assistance Equipment used: Rolling walker (2 wheeled) Transfers: Sit to/from Stand Sit to Stand: Min assist         General transfer comment: min A for powerup, cues for technique    Balance Overall balance assessment: Needs assistance Sitting-balance support: Feet supported;No upper extremity supported Sitting balance-Leahy Scale: Fair     Standing balance support: Bilateral upper extremity supported;During functional activity Standing balance-Leahy Scale: Poor                              ADL Overall ADL's : Needs assistance/impaired Eating/Feeding: Set up;Sitting   Grooming: Set up;Sitting;Standing    Upper Body Bathing: Set up;Sitting   Lower Body Bathing: With adaptive equipment;Sit to/from stand;Minimal assistance   Upper Body Dressing : Set up;Sitting   Lower Body Dressing: With adaptive equipment;Sit to/from stand;Minimal assistance   Toilet Transfer: Ambulation;RW;Minimal assistance (3n1 over toilet)   Toileting- Clothing Manipulation and Hygiene: Sit to/from stand;Minimal assistance   Tub/ Shower Transfer: Ambulation;3 in 1;Rolling walker;Minimal assistance   Functional mobility during ADLs: Min guard;Rolling walker General ADL Comments: Educated pt on techniques and AE for safe completion of ADLs with knee precautions.      Vision                     Perception     Praxis      Pertinent Vitals/Pain Pain Assessment: 0-10 Pain Score: 10-Worst pain ever Pain Location: R knee Pain Descriptors / Indicators: Constant Pain Intervention(s): Limited activity within patient's tolerance;Monitored during session;Premedicated before session;Repositioned;Utilized relaxation techniques;Ice applied     Hand Dominance Right   Extremity/Trunk Assessment Upper Extremity Assessment Upper Extremity Assessment: Overall WFL for tasks assessed   Lower Extremity Assessment Lower Extremity Assessment: Defer to PT evaluation   Cervical / Trunk Assessment Cervical / Trunk Assessment: Normal   Communication Communication Communication: No difficulties   Cognition Arousal/Alertness: Awake/alert Behavior During Therapy: WFL for tasks assessed/performed Overall Cognitive Status: Within Functional Limits for tasks assessed                     General Comments       Exercises  Shoulder Instructions      Home Living Family/patient expects to be discharged to:: Private residence Living Arrangements: Spouse/significant other Available Help at Discharge: Family;Available 24 hours/day Type of Home: House Home Access: Stairs to enter [employer]  of Steps: 1 Entrance Stairs-Rails: Right Home Layout: Two level Alternate Level Stairs-Number of Steps: 1 step to den Alternate Level Stairs-Rails: None Bathroom Shower/Tub: Tub/shower unit;Curtain Shower/tub characteristics: Architectural technologist: Standard Bathroom Accessibility: Yes How Accessible: Accessible via walker;Other (comment) (may need to side step) Home Equipment: Walker - 2 wheels;Bedside commode          Prior Functioning/Environment Level of Independence: Independent             OT Diagnosis: Acute pain   OT Problem List: Impaired balance (sitting and/or standing);Decreased knowledge of use of DME or AE;Decreased knowledge of precautions;Pain   OT Treatment/Interventions: Self-care/ADL training;Therapeutic exercise;DME and/or AE instruction;Therapeutic activities;Patient/family education;Balance training    OT Goals(Current goals can be found in the care plan section) Acute Rehab OT Goals Patient Stated Goal: not stated OT Goal Formulation: With patient Time For Goal Achievement: 12/08/13 Potential to Achieve Goals: Good ADL Goals Pt Will Perform Lower Body Bathing: with supervision;with adaptive equipment;sit to/from stand Pt Will Perform Lower Body Dressing: with supervision;with adaptive equipment;sit to/from stand Pt Will Transfer to Toilet: with supervision;ambulating (3n1 over toilet) Pt Will Perform Toileting - Clothing Manipulation and hygiene: with supervision;with adaptive equipment;sit to/from stand Pt Will Perform Tub/Shower Transfer: with supervision;ambulating;3 in 1;rolling walker  OT Frequency: Min 3X/week   Barriers to D/C:            Co-evaluation              End of Session Equipment Utilized During Treatment: Gait belt;Rolling walker  Activity Tolerance: Patient limited by pain Patient left: in chair;with call bell/phone within reach   Time: 1103-1130 OT Time Calculation (min): 27 min Charges:  OT General Charges $OT  Visit: 1 Procedure OT Evaluation $Initial OT Evaluation Tier I: 1 Procedure OT Treatments $Self Care/Home Management : 8-22 mins G-Codes:    Hortencia Pilar Dec 28, 2013, 12:58 PM

## 2013-12-01 NOTE — Op Note (Signed)
TOTAL KNEE REPLACEMENT OPERATIVE NOTE:  11/30/2013  1:20 PM  PATIENT:  Clarence Schneider  63 y.o. male  PRE-OPERATIVE DIAGNOSIS:  osteoarthritis right knee  POST-OPERATIVE DIAGNOSIS:  osteoarthritis right knee  PROCEDURE:  Procedure(s): TOTAL KNEE ARTHROPLASTY  SURGEON:  Surgeon(s): Vickey Huger, MD  PHYSICIAN ASSISTANT: Carlynn Spry, [facility]  ANESTHESIA:   general  DRAINS: Hemovac  SPECIMEN: None  COUNTS:  Correct  TOURNIQUET:   Total Tourniquet Time Documented: Thigh (Right) - 53 minutes Total: Thigh (Right) - 53 minutes   DICTATION:  Indication for procedure:    The patient is a 63 y.o. male who has failed conservative treatment for osteoarthritis right knee.  Informed consent was obtained prior to anesthesia. The risks versus benefits of the operation were explain and in a way the patient can, and did, understand.   On the implant demand matching protocol, this patient scored 16.  Therefore, this patient was receive a polyethylene insert with vitamin E which is a high demand implant.  Description of procedure:     The patient was taken to the operating room and placed under anesthesia.  The patient was positioned in the usual fashion taking care that all body parts were adequately padded and/or protected.  I foley catheter was not placed.  A tourniquet was applied and the leg prepped and draped in the usual sterile fashion.  The extremity was exsanguinated with the esmarch and tourniquet inflated to 350 mmHg.  Pre-operative range of motion was normal.  The knee was in 5 degree of mild varus.  A midline incision approximately 6-7 inches long was made with a #10 blade.  A new blade was used to make a parapatellar arthrotomy going 2-3 cm into the quadriceps tendon, over the patella, and alongside the medial aspect of the patellar tendon.  A synovectomy was then performed with the #10 blade and forceps. I then elevated the deep MCL off the medial tibial metaphysis  subperiosteally around to the semimembranosus attachment.    I everted the patella and used calipers to measure patellar thickness.  I used the reamer to ream down to appropriate thickness to recreate the native thickness.  I then removed excess bone with the rongeur and sagittal saw.  I used the appropriately sized template and drilled the three lug holes.  I then put the trial in place and measured the thickness with the calipers to ensure recreation of the native thickness.  The trial was then removed and the patella subluxed and the knee brought into flexion.  A homan retractor was place to retract and protect the patella and lateral structures.  A Z-retractor was place medially to protect the medial structures.  The extra-medullary alignment system was used to make cut the tibial articular surface perpendicular to the anamotic axis of the tibia and in 3 degrees of posterior slope.  The cut surface and alignment jig was removed.  I then used the intramedullary alignment guide to make a 6 valgus cut on the distal femur.  I then marked out the epicondylar axis on the distal femur.  The posterior condylar axis measured 3 degrees.  I then used the anterior referencing sizer and measured the femur to be a size 9.  The 4-In-1 cutting block was screwed into place in external rotation matching the posterior condylar angle, making our cuts perpendicular to the epicondylar axis.  Anterior, posterior and chamfer cuts were made with the sagittal saw.  The cutting block and cut pieces were removed.  A lamina  spreader was placed in 90 degrees of flexion.  The ACL, PCL, menisci, and posterior condylar osteophytes were removed.  A 12 mm spacer blocked was found to offer good flexion and extension gap balance after minimal in degree releasing.   The scoop retractor was then placed and the femoral finishing block was pinned in place.  The small sagittal saw was used as well as the lug drill to finish the femur.  The block  and cut surfaces were removed and the medullary canal hole filled with autograft bone from the cut pieces.  The tibia was delivered forward in deep flexion and external rotation.  A size G tray was selected and pinned into place centered on the medial 1/3 of the tibial tubercle.  The reamer and keel was used to prepare the tibia through the tray.    I then trialed with the size 9 femur, size G tibia, a 12 mm insert and the 38 patella.  I had excellent flexion/extension gap balance, excellent patella tracking.  Flexion was full and beyond 120 degrees; extension was zero.  These components were chosen and the staff opened them to me on the back table while the knee was lavaged copiously and the cement mixed.  The soft tissue was infiltrated with 60cc of exparel 1.3% through a 21 gauge needle.  I cemented in the components and removed all excess cement.  The polyethylene tibial component was snapped into place and the knee placed in extension while cement was hardening.  The capsule was infilltrated with 30cc of .25% Marcaine with epinephrine.  A hemovac was place in the joint exiting superolaterally.  A pain pump was place superomedially superficial to the arthrotomy.  Once the cement was hard, the tourniquet was let down.  Hemostasis was obtained.  The arthrotomy was closed with figure-8 #1 vicryl sutures.  The deep soft tissues were closed with #0 vicryls and the subcuticular layer closed with a running #2-0 vicryl.  The skin was reapproximated and closed with skin staples.  The wound was dressed with xeroform, 4 x4's, 2 ABD sponges, a single layer of webril and a TED stocking.   The patient was then awakened, extubated, and taken to the recovery room in stable condition.  BLOOD LOSS:  300cc DRAINS: 1 hemovac, 1 pain catheter COMPLICATIONS:  None.  PLAN OF CARE: Admit to inpatient   PATIENT DISPOSITION:  PACU - hemodynamically stable.   Delay start of Pharmacological VTE agent (>24hrs) due to  surgical blood loss or risk of bleeding:  not applicable  Please fax a copy of this op note to my office at [phone] (please only include page 1 and 2 of the Case Information op note)

## 2013-12-01 NOTE — Progress Notes (Signed)
SPORTS MEDICINE AND JOINT REPLACEMENT  Lara Mulch, MD   Carlynn Spry, PA-C [place], [place], [place]  [address]                             [phone]   PROGRESS NOTE  Subjective:  negative for Chest Pain  negative for Shortness of Breath  negative for Nausea/Vomiting   negative for Calf Pain  negative for Bowel Movement   Tolerating Diet: yes         Patient reports pain as 7 on 0-10 scale.    Objective: Vital signs in last 24 hours:   Patient Vitals for the past 24 hrs:  BP Temp Temp src Pulse Resp SpO2  12/01/13 0803 122/74 mmHg - - - - -  12/01/13 0502 122/74 mmHg 100 F (37.8 C) Oral 91 - 100 %  12/01/13 0400 - - - - 16 100 %  12/01/13 0130 135/77 mmHg 99.4 F (37.4 C) Oral 86 - 100 %  12/01/13 0000 - - - - 16 98 %  11/30/13 2032 128/87 mmHg 99.4 F (37.4 C) Oral 75 - 100 %  11/30/13 2000 - - - - 16 100 %  11/30/13 1120 170/98 mmHg 97.8 F (36.6 C) - 63 16 100 %  11/30/13 1054 - 96.8 F (36 C) - - - -  11/30/13 1045 150/105 mmHg - - - 16 -  11/30/13 1027 170/123 mmHg - - 60 14 100 %  11/30/13 1015 - - - 66 12 100 %  11/30/13 1003 - - - 64 12 100 %  11/30/13 0956 157/99 mmHg - - 65 10 100 %  11/30/13 0945 162/100 mmHg - - - 15 -  11/30/13 0941 163/100 mmHg - - 64 12 100 %  11/30/13 0938 - 96.8 F (36 C) - - - -    @flow {1959:LAST@   Intake/Output from previous day:   08/17 0701 - 08/18 0700 In: 3665 [P.O.:240; I.V.:3375] Out: 1400 [Urine:1175; Drains:150]   Intake/Output this shift:       Intake/Output     08/17 0701 - 08/18 0700 08/18 0701 - 08/19 0700   P.O. 240    I.V. (mL/kg) 3375 (37)    IV Piggyback 50    Total Intake(mL/kg) 3665 (40.2)    Urine (mL/kg/hr) 1175 (0.5)    Drains 150 (0.1)    Blood 75 (0)    Total Output 1400     Net +2265             LABORATORY DATA:  Recent Labs  12/01/13 0400  WBC 11.9*  HGB 10.8*  HCT 32.7*  PLT 167    Recent Labs  12/01/13 0400  NA 136*  K 4.8  CL 101  CO2 28  BUN  10  CREATININE 1.48*  GLUCOSE 120*  CALCIUM 7.7*   Lab Results  Component Value Date   INR 1.12 11/18/2013   INR 0.98 05/01/2013    Examination:  General appearance: alert, cooperative and no distress Extremities: Homans sign is negative, no sign of DVT  Wound Exam: erythematous   Drainage:  Moderate amount Serosanguinous exudate  Motor Exam: EHL and FHL Intact  Sensory Exam: Deep Peroneal normal   Assessment:    1 Day Post-Op  Procedure(s) (LRB): TOTAL KNEE ARTHROPLASTY (Right)  ADDITIONAL DIAGNOSIS:  Active Problems:   S/P total knee replacement using cement  Acute Blood Loss Anemia   Plan: Physical Therapy  as ordered Weight Bearing as Tolerated (WBAT)  DVT Prophylaxis: yes  DISCHARGE PLAN: Home  DISCHARGE NEEDS: HHPT, CPM, Walker and 3-in-1 comode seat         Clarence Schneider 12/01/2013, 8:19 AM

## 2013-12-01 NOTE — Progress Notes (Signed)
Physical Therapy Treatment Patient Details Name: Clarence Schneider MRN: [id number] DOB: Apr 11, 1951 Today's Date: 12/01/2013    History of Present Illness Pt s/p elective R TKA 8/17. Pt s/p L TKA January 2015.    PT Comments    Patient with some increased pain this morning. Patient requiring assist for OOB and standing. No evidence of knee buckling. Will need to attempt steps this afternoon to determine if patient is safe to DC home at this time.   Follow Up Recommendations  Home health PT;Supervision/Assistance - 24 hour     Equipment Recommendations  None recommended by PT    Recommendations for Other Services       Precautions / Restrictions Precautions Precautions: Knee Restrictions Weight Bearing Restrictions: Yes LLE Weight Bearing: Weight bearing as tolerated    Mobility  Bed Mobility Overal bed mobility: Needs Assistance Bed Mobility: Supine to Sit     Supine to sit: Min assist     General bed mobility comments: A for R LE. Cues for positioning  Transfers Overall transfer level: Needs assistance Equipment used: Rolling walker (2 wheeled)   Sit to Stand: Min assist         General transfer comment: A for powering up for stand. Cues for hand placement and positioning prior to sitting  Ambulation/Gait Ambulation/Gait assistance: Min guard Ambulation Distance (Feet): 40 Feet Assistive device: Rolling walker (2 wheeled) Gait Pattern/deviations: Step-to pattern;Decreased step length - right Gait velocity: decreased   General Gait Details: Cues for RW management, gait sequence and positioning within RW   Stairs            Wheelchair Mobility    Modified Rankin (Stroke Patients Only)       Balance                                    Cognition Arousal/Alertness: Awake/alert Behavior During Therapy: WFL for tasks assessed/performed Overall Cognitive Status: Within Functional Limits for tasks assessed                       Exercises Total Joint Exercises Quad Sets: AROM;Right;10 reps Heel Slides: AAROM;Right;10 reps Hip ABduction/ADduction: AAROM;Right;10 reps    General Comments        Pertinent Vitals/Pain Pain Assessment: 0-10 Pain Score: 5  Pain Location: R knee Pain Descriptors / Indicators: Constant Pain Intervention(s): Monitored during session    Home Living                      Prior Function            PT Goals (current goals can now be found in the care plan section) Progress towards PT goals: Progressing toward goals    Frequency  7X/week    PT Plan Current plan remains appropriate    Co-evaluation             End of Session Equipment Utilized During Treatment: Gait belt Activity Tolerance: Patient tolerated treatment well Patient left: in chair;with call bell/phone within reach     Time: 0755-0824 PT Time Calculation (min): 29 min  Charges:  $Gait Training: 8-22 mins $Therapeutic Exercise: 8-22 mins                    G Codes:      Jacqualyn Posey 12/01/2013, 11:29 AM  12/01/2013 Jacqualyn Posey PTA [phone] pager [phone]  office

## 2013-12-01 NOTE — Discharge Instructions (Signed)
Diet: As you were doing prior to hospitalization   Activity:  Increase activity slowly as tolerated                  No lifting or driving for 6 weeks  Shower:  May shower without a dressing once there is no drainage from your wound.                 Do NOT wash over the wound.                 Dressing:  You may change your dressing on Wednesday                    Then change the dressing daily with sterile 4"x4"s gauze dressing                     And TED hose for knees.  Weight Bearing:  Weight bearing as tolerated as taught in physical therapy.  Use a                                walker or Crutches as instructed.  To prevent constipation: you may use a stool softener such as -               Colace ( over the counter) 100 mg by mouth twice a day                Drink plenty of fluids ( prune juice may be helpful) and high fiber foods                Miralax ( over the counter) for constipation as needed.    Precautions:  If you experience chest pain or shortness of breath - call 911 immediately               For transfer to the hospital emergency department!!               If you develop a fever greater that 101 F, purulent drainage from wound,                             increased redness or drainage from wound, or calf pain -- Call the office.  Follow- Up Appointment:  Please call for an appointment to be seen on 12/15/13                                              [facility] office:  [phone]            [address] [place], [place] [address]

## 2013-12-02 ENCOUNTER — Encounter (HOSPITAL_COMMUNITY): Payer: Self-pay | Admitting: Orthopedic Surgery

## 2013-12-04 ENCOUNTER — Encounter (HOSPITAL_COMMUNITY): Payer: PRIVATE HEALTH INSURANCE

## 2013-12-09 NOTE — Discharge Summary (Signed)
SPORTS MEDICINE & JOINT REPLACEMENT   Lara Mulch, MD   Carlynn Spry, PA-C [place], [place], [place]  [address]                             [phone]  PATIENT ID: Clarence Schneider        MRN:  [id number]          DOB/AGE: 1951/03/24 / 63 y.o.    DISCHARGE SUMMARY  ADMISSION DATE:    11/30/2013 DISCHARGE DATE:  12/01/2013  ADMISSION DIAGNOSIS: osteoarthritis right knee    DISCHARGE DIAGNOSIS:  osteoarthritis right knee    ADDITIONAL DIAGNOSIS: Active Problems:   S/P total knee replacement using cement  Past Medical History  Diagnosis Date  . Esophageal reflux   . Hypertension   . Shortness of breath     with exertion  . Headache(784.0)   . Dizziness   . Lightheadedness     doesn't occur everyday; happens at times; once occured while walking up a incline  . Pinched nerve     in back  . Arthritis   . Neuromuscular disorder     Takes Gabapentin  . Hyperlipemia   . Skin abrasion     under both arms; uses Lotrisone cream twice a day  . Coronary artery disease     stents  . Sleep apnea     cpap doesnt use reg    PROCEDURE: Procedure(s): TOTAL KNEE ARTHROPLASTY on 11/30/2013  CONSULTS:     HISTORY: See H&P in chart  HOSPITAL COURSE:  Clarence Schneider is a 63 y.o. admitted on 11/30/2013 and found to have a diagnosis of osteoarthritis right knee.  After appropriate laboratory studies were obtained  they were taken to the operating room on 11/30/2013 and underwent Procedure(s): TOTAL KNEE ARTHROPLASTY.   They were given perioperative antibiotics:  Anti-infectives   Start     Dose/Rate Route Frequency Ordered Stop   11/30/13 1300  ceFAZolin (ANCEF) IVPB 2 g/50 mL premix     2 g 100 mL/hr over 30 Minutes Intravenous Every 6 hours 11/30/13 1218 11/30/13 2113   11/30/13 0600  ceFAZolin (ANCEF) IVPB 2 g/50 mL premix     2 g 100 mL/hr over 30 Minutes Intravenous On call to O.R. 11/29/13 1348 11/30/13 0750    .  Tolerated the procedure well.  Placed  with a foley intraoperatively.  Given Ofirmev at induction and for 48 hours.    POD# 1: Vital signs were stable.  Patient denied Chest pain, shortness of breath, or calf pain.  Patient was started on Lovenox 30 mg subcutaneously twice daily at 8am.  Consults to PT, OT, and care management were made.  The patient was weight bearing as tolerated.  CPM was placed on the operative leg 0-90 degrees for 6-8 hours a day.  Incentive spirometry was taught.  Dressing was changed.  Hemovac was discontinued.      POD #2, Continued  PT for ambulation and exercise program.  IV saline locked.  O2 discontinued.    The remainder of the hospital course was dedicated to ambulation and strengthening.   The patient was discharged on 1 day post op in  Good condition.  Blood products given:none  DIAGNOSTIC STUDIES: Recent vital signs: No data found.      Recent laboratory studies: No results found for this basename: WBC, HGB, HCT, PLT,  in the last 168 hours No results found for  this basename: NA, K, CL, CO2, BUN, CREATININE, GLUCOSE, CALCIUM,  in the last 168 hours Lab Results  Component Value Date   INR 1.12 11/18/2013   INR 0.98 05/01/2013     Recent Radiographic Studies :  No results found.  DISCHARGE INSTRUCTIONS: Discharge Instructions   CPM    Complete by:  As directed   Continuous passive motion machine (CPM):      Use the CPM from 0 to 90 for 6-8 hours per day.      You may increase by 10 per day.  You may break it up into 2 or 3 sessions per day.      Use CPM for 2 weeks or until you are told to stop.     Call MD / Call 911    Complete by:  As directed   If you experience chest pain or shortness of breath, CALL 911 and be transported to the hospital emergency room.  If you develope a fever above 101 F, pus (white drainage) or increased drainage or redness at the wound, or calf pain, call your surgeon's office.     Change dressing    Complete by:  As directed   Change dressing on Wednesday,  then change the dressing daily with sterile 4 x 4 inch gauze dressing and apply TED hose.     Constipation Prevention    Complete by:  As directed   Drink plenty of fluids.  Prune juice may be helpful.  You may use a stool softener, such as Colace (over the counter) 100 mg twice a day.  Use MiraLax (over the counter) for constipation as needed.     Diet - low sodium heart healthy    Complete by:  As directed      Do not put a pillow under the knee. Place it under the heel.    Complete by:  As directed      Driving restrictions    Complete by:  As directed   No driving for 6 weeks     Increase activity slowly as tolerated    Complete by:  As directed      Lifting restrictions    Complete by:  As directed   No lifting for 6 weeks     TED hose    Complete by:  As directed   Use stockings (TED hose) for 3 weeks on both leg(s).  You may remove them at night for sleeping.           DISCHARGE MEDICATIONS:     Medication List    STOP taking these medications       traMADol 50 MG tablet  Commonly known as:  ULTRAM      TAKE these medications       amLODipine 5 MG tablet  Commonly known as:  NORVASC  Take 2.5 mg by mouth daily.     aspirin 325 MG EC tablet  Take 325 mg by mouth daily.     carvedilol 12.5 MG tablet  Commonly known as:  COREG  Take 18.75 mg by mouth 2 (two) times daily with a meal. 1.5 tablets     clopidogrel 75 MG tablet  Commonly known as:  PLAVIX  Take 75 mg by mouth daily with breakfast.     colesevelam 625 MG tablet  Commonly known as:  WELCHOL  Take 1,250 mg by mouth 2 (two) times daily with a meal.     diphenhydrAMINE 50 MG tablet  Commonly  known as:  BENADRYL  Take 1 tablet (50 mg total) by mouth every 6 (six) hours as needed for itching.     ezetimibe 10 MG tablet  Commonly known as:  ZETIA  Take 10 mg by mouth daily.     gabapentin 400 MG capsule  Commonly known as:  NEURONTIN  Take 800 mg by mouth 2 (two) times daily.     losartan 100  MG tablet  Commonly known as:  COZAAR  Take 50 mg by mouth daily.     meloxicam 15 MG tablet  Commonly known as:  MOBIC  Take 15 mg by mouth daily.     methocarbamol 500 MG tablet  Commonly known as:  ROBAXIN  Take 1-2 tablets (500-1,000 mg total) by mouth every 6 (six) hours as needed for muscle spasms.     multivitamin with minerals tablet  Take 1 tablet by mouth daily.     omega-3 acid ethyl esters 1 G capsule  Commonly known as:  LOVAZA  Take 1 g by mouth 2 (two) times daily.     oxyCODONE 5 MG immediate release tablet  Commonly known as:  Oxy IR/ROXICODONE  Take 1-2 tablets (5-10 mg total) by mouth every 3 (three) hours as needed for severe pain.     OxyCODONE 10 mg T12a 12 hr tablet  Commonly known as:  OXYCONTIN  Take 1 tablet (10 mg total) by mouth every 12 (twelve) hours.     pyridoxine 200 MG tablet  Commonly known as:  B-6  Take 200 mg by mouth daily.     rosuvastatin 10 MG tablet  Commonly known as:  CRESTOR  Take 15 mg by mouth daily.     testosterone cypionate 200 MG/ML injection  Commonly known as:  DEPOTESTOTERONE CYPIONATE  Inject 300 mg into the muscle every 14 (fourteen) days. 1.5 ml (300 mg) every 2 weeks     vitamin B-12 1000 MCG tablet  Commonly known as:  CYANOCOBALAMIN  Take 1,000 mcg by mouth daily.     vitamin C 500 MG tablet  Commonly known as:  ASCORBIC ACID  Take 500 mg by mouth daily.     vitamin E 400 UNIT capsule  Take 400 Units by mouth daily.        FOLLOW UP VISIT:       Follow-up Information   Follow up with [facility]. (Someone from [facility] will contact you concerning start date and time for physical therapy.)    Contact information:   [place] [address] [phone]       Follow up with [facility]. (Someone from [facility] will contact you concerning start date and time for physical therapy.)    Contact information:   [place]  [place] [address] [phone]       Follow up with Rudean Haskell, MD. Call on 12/15/2013.   Specialty:  Orthopedic Surgery   Contact information:   [place]  [address] [phone]       DISPOSITION: HOME  CONDITION:  Good   Ruthe Roemer 12/09/2013, 10:58 AM

## 2014-06-03 DIAGNOSIS — M7542 Impingement syndrome of left shoulder: Secondary | ICD-10-CM | POA: Insufficient documentation

## 2014-06-03 HISTORY — DX: Impingement syndrome of left shoulder: M75.42

## 2014-06-28 DIAGNOSIS — Z9889 Other specified postprocedural states: Secondary | ICD-10-CM | POA: Insufficient documentation

## 2014-06-28 HISTORY — DX: Other specified postprocedural states: Z98.890

## 2014-12-27 DIAGNOSIS — R42 Dizziness and giddiness: Secondary | ICD-10-CM | POA: Insufficient documentation

## 2014-12-27 DIAGNOSIS — I10 Essential (primary) hypertension: Secondary | ICD-10-CM

## 2014-12-27 HISTORY — DX: Essential (primary) hypertension: I10

## 2014-12-27 HISTORY — DX: Dizziness and giddiness: R42

## 2015-04-17 HISTORY — PX: HEMORRHOID SURGERY: SHX153

## 2015-05-26 DIAGNOSIS — N529 Male erectile dysfunction, unspecified: Secondary | ICD-10-CM

## 2015-05-26 HISTORY — DX: Male erectile dysfunction, unspecified: N52.9

## 2015-05-27 DIAGNOSIS — E291 Testicular hypofunction: Secondary | ICD-10-CM | POA: Insufficient documentation

## 2015-05-27 DIAGNOSIS — E349 Endocrine disorder, unspecified: Secondary | ICD-10-CM | POA: Insufficient documentation

## 2015-05-27 HISTORY — DX: Endocrine disorder, unspecified: E34.9

## 2015-05-27 HISTORY — DX: Testicular hypofunction: E29.1

## 2015-09-02 DIAGNOSIS — J329 Chronic sinusitis, unspecified: Secondary | ICD-10-CM

## 2015-09-02 DIAGNOSIS — J019 Acute sinusitis, unspecified: Secondary | ICD-10-CM | POA: Insufficient documentation

## 2015-09-02 DIAGNOSIS — J0120 Acute ethmoidal sinusitis, unspecified: Secondary | ICD-10-CM

## 2015-09-02 DIAGNOSIS — M5416 Radiculopathy, lumbar region: Secondary | ICD-10-CM

## 2015-09-02 HISTORY — DX: Radiculopathy, lumbar region: M54.16

## 2015-09-02 HISTORY — DX: Acute ethmoidal sinusitis, unspecified: J01.20

## 2015-09-02 HISTORY — DX: Acute sinusitis, unspecified: J01.90

## 2015-09-02 HISTORY — DX: Chronic sinusitis, unspecified: J32.9

## 2016-01-04 DIAGNOSIS — M7551 Bursitis of right shoulder: Secondary | ICD-10-CM | POA: Insufficient documentation

## 2016-01-04 DIAGNOSIS — M7711 Lateral epicondylitis, right elbow: Secondary | ICD-10-CM

## 2016-01-04 HISTORY — DX: Bursitis of right shoulder: M75.51

## 2016-01-04 HISTORY — DX: Lateral epicondylitis, right elbow: M77.11

## 2016-03-19 DIAGNOSIS — I2510 Atherosclerotic heart disease of native coronary artery without angina pectoris: Secondary | ICD-10-CM

## 2016-03-19 DIAGNOSIS — J309 Allergic rhinitis, unspecified: Secondary | ICD-10-CM | POA: Insufficient documentation

## 2016-03-19 DIAGNOSIS — G500 Trigeminal neuralgia: Secondary | ICD-10-CM | POA: Insufficient documentation

## 2016-03-19 DIAGNOSIS — Z1211 Encounter for screening for malignant neoplasm of colon: Secondary | ICD-10-CM | POA: Insufficient documentation

## 2016-03-19 DIAGNOSIS — Z125 Encounter for screening for malignant neoplasm of prostate: Secondary | ICD-10-CM

## 2016-03-19 DIAGNOSIS — M545 Low back pain, unspecified: Secondary | ICD-10-CM

## 2016-03-19 DIAGNOSIS — E782 Mixed hyperlipidemia: Secondary | ICD-10-CM | POA: Insufficient documentation

## 2016-03-19 DIAGNOSIS — G8929 Other chronic pain: Secondary | ICD-10-CM

## 2016-03-19 DIAGNOSIS — I739 Peripheral vascular disease, unspecified: Secondary | ICD-10-CM | POA: Insufficient documentation

## 2016-03-19 DIAGNOSIS — G4733 Obstructive sleep apnea (adult) (pediatric): Secondary | ICD-10-CM | POA: Insufficient documentation

## 2016-03-19 HISTORY — DX: Encounter for screening for malignant neoplasm of prostate: Z12.5

## 2016-03-19 HISTORY — DX: Trigeminal neuralgia: G50.0

## 2016-03-19 HISTORY — DX: Low back pain, unspecified: M54.50

## 2016-03-19 HISTORY — DX: Other chronic pain: G89.29

## 2016-03-19 HISTORY — DX: Obstructive sleep apnea (adult) (pediatric): G47.33

## 2016-03-19 HISTORY — DX: Peripheral vascular disease, unspecified: I73.9

## 2016-03-19 HISTORY — DX: Mixed hyperlipidemia: E78.2

## 2016-03-19 HISTORY — DX: Encounter for screening for malignant neoplasm of colon: Z12.11

## 2016-03-19 HISTORY — DX: Allergic rhinitis, unspecified: J30.9

## 2016-03-19 HISTORY — DX: Atherosclerotic heart disease of native coronary artery without angina pectoris: I25.10

## 2016-03-20 DIAGNOSIS — D126 Benign neoplasm of colon, unspecified: Secondary | ICD-10-CM

## 2016-03-20 HISTORY — DX: Benign neoplasm of colon, unspecified: D12.6

## 2016-04-16 HISTORY — PX: CATARACT EXTRACTION W/ INTRAOCULAR LENS IMPLANT: SHX1309

## 2016-05-21 DIAGNOSIS — N329 Bladder disorder, unspecified: Secondary | ICD-10-CM

## 2016-05-21 HISTORY — DX: Bladder disorder, unspecified: N32.9

## 2016-06-18 DIAGNOSIS — E119 Type 2 diabetes mellitus without complications: Secondary | ICD-10-CM

## 2016-06-18 DIAGNOSIS — R7303 Prediabetes: Secondary | ICD-10-CM | POA: Insufficient documentation

## 2016-06-18 HISTORY — DX: Prediabetes: R73.03

## 2016-06-18 HISTORY — DX: Type 2 diabetes mellitus without complications: E11.9

## 2016-07-12 DIAGNOSIS — M25562 Pain in left knee: Secondary | ICD-10-CM

## 2016-07-12 DIAGNOSIS — G8929 Other chronic pain: Secondary | ICD-10-CM | POA: Insufficient documentation

## 2016-07-12 DIAGNOSIS — M25511 Pain in right shoulder: Secondary | ICD-10-CM

## 2016-07-12 DIAGNOSIS — M25561 Pain in right knee: Secondary | ICD-10-CM

## 2016-07-12 HISTORY — DX: Other chronic pain: G89.29

## 2016-07-12 HISTORY — DX: Pain in left knee: M25.562

## 2016-11-06 ENCOUNTER — Encounter: Payer: Self-pay | Admitting: Cardiology

## 2016-11-06 ENCOUNTER — Ambulatory Visit (INDEPENDENT_AMBULATORY_CARE_PROVIDER_SITE_OTHER): Payer: Commercial Managed Care - PPO | Admitting: Cardiology

## 2016-11-06 DIAGNOSIS — E782 Mixed hyperlipidemia: Secondary | ICD-10-CM | POA: Diagnosis not present

## 2016-11-06 DIAGNOSIS — I10 Essential (primary) hypertension: Secondary | ICD-10-CM | POA: Diagnosis not present

## 2016-11-06 DIAGNOSIS — I2510 Atherosclerotic heart disease of native coronary artery without angina pectoris: Secondary | ICD-10-CM | POA: Diagnosis not present

## 2016-11-06 HISTORY — DX: Mixed hyperlipidemia: E78.2

## 2016-11-06 NOTE — Patient Instructions (Signed)

## 2016-11-06 NOTE — Progress Notes (Signed)
Cardiology Office Note:    Date:  11/06/2016   ID:  Clarence Schneider, DOB 07/29/1950, MRN [id number]  PCP:  Algis Greenhouse, MD  Cardiologist:  Jenean Lindau, MD   Referring MD: Algis Greenhouse, MD    ASSESSMENT:    1. Coronary artery disease involving native coronary artery of native heart without angina pectoris   2. Essential hypertension   3. Mixed dyslipidemia    PLAN:    In order of problems listed above:  1. Secondary prevention stressed to the patient. Importance of compliance with diet and medications stressed and he vocalized understanding he has an excellent exercise program and encouraged him to continue this. 2. His lipids were he was advised to have blood work for lipids in the month. He prefers to get it done at primary care doctor's office and sent with the information. If he does not hear from me after that he will get in touch with me within a week after sending me that information. 3. Blood pressure is stable. Blood pressure is better at home. He has an element of whitecoat Hypertension. I'm a little concerned about increasing his blood pressure medications because he has very occasional dizziness. He will be seen in follow-up appointment in 6 months or earlier if he has any concerns.   Medication Adjustments/Labs and Tests Ordered: Current medicines are reviewed at length with the patient today.  Concerns regarding medicines are outlined above.  No orders of the defined types were placed in this encounter.  No orders of the defined types were placed in this encounter.    History of Present Illness:    Clarence Schneider is a 66 y.o. male who is being seen today for the evaluation of Coronary artery disease at the request of Dough, Jaymes Graff, MD. Patient is a pleasant 66 year old male with past medical history of essential hypertension, dyslipidemia. He denies any problems at this time and takes care of activities of daily living. No chest pain orthopnea or  PND. He is new to this practice. He was seen by me at the area practice and has [facility]. He walks up to 3 miles a day without any symptoms. He recently had blood work and his medication was changed to rosuvastatin 10 mg daily.  Past Medical History:  Diagnosis Date  . Arthritis   . Coronary artery disease    stents  . Dizziness   . Esophageal reflux   . Headache(784.0)   . Hyperlipemia   . Hypertension   . Lightheadedness    doesn't occur everyday; happens at times; once occured while walking up a incline  . Neuromuscular disorder (HCC)    Takes Gabapentin  . Pinched nerve    in back  . Shortness of breath    with exertion  . Skin abrasion    under both arms; uses Lotrisone cream twice a day  . Sleep apnea    cpap doesnt use reg    Past Surgical History:  Procedure Laterality Date  . APPENDECTOMY    . CARDIAC CATHETERIZATION     X 3 stents  . CARPAL TUNNEL RELEASE Bilateral   . COLONOSCOPY W/ POLYPECTOMY    . ELBOW ARTHROSCOPY Bilateral    ulner nerve   2 surgeries  . INGUINAL HERNIA REPAIR Right   . KNEE ARTHROSCOPY Bilateral    Left x 1 Right X 2  . SHOULDER ARTHROSCOPY Left   . TONSILLECTOMY    .  TOTAL KNEE ARTHROPLASTY Left 05/11/2013   Procedure: LEFT TOTAL KNEE ARTHROPLASTY;  Surgeon: Vickey Huger, MD;  Location: [facility];  Service: Orthopedics;  Laterality: Left;  . TOTAL KNEE ARTHROPLASTY Right 11/30/2013   dr Ronnie Derby  . TOTAL KNEE ARTHROPLASTY Right 11/30/2013   Procedure: TOTAL KNEE ARTHROPLASTY;  Surgeon: Vickey Huger, MD;  Location: [facility];  Service: Orthopedics;  Laterality: Right;    Current Medications: Current Meds  Medication Sig  . aspirin 81 MG EC tablet Take 81 mg by mouth daily.   . carvedilol (COREG) 6.25 MG tablet Take 6.25 mg by mouth 2 (two) times daily with a meal. 1.5 tablets  . clopidogrel (PLAVIX) 75 MG tablet Take 75 mg by mouth daily.  . Multiple Vitamins-Minerals (MULTIVITAMIN WITH MINERALS) tablet Take 1  tablet by mouth daily.  . nabumetone (RELAFEN) 500 MG tablet Take 3 tablets by mouth daily.  Marland Kitchen omega-3 acid ethyl esters (LOVAZA) 1 G capsule Take 1 g by mouth 2 (two) times daily.   Marland Kitchen pyridoxine (B-6) 200 MG tablet Take 200 mg by mouth daily.  . rosuvastatin (CRESTOR) 10 MG tablet Take 10 mg by mouth daily.   . sildenafil (REVATIO) 20 MG tablet Take 20 mg by mouth.  . telmisartan-hydrochlorothiazide (MICARDIS HCT) 80-12.5 MG tablet Take 0.5 tablets by mouth daily.  Marland Kitchen testosterone cypionate (DEPOTESTOTERONE CYPIONATE) 200 MG/ML injection Inject 300 mg into the muscle every 14 (fourteen) days. 1.5 ml (300 mg) every 2 weeks  . vitamin B-12 (CYANOCOBALAMIN) 1000 MCG tablet Take 1,000 mcg by mouth daily.  . [DISCONTINUED] clopidogrel (PLAVIX) 75 MG tablet Take 75 mg by mouth daily with breakfast.     Allergies:   Hydrocodone-acetaminophen; Oxycodone; Reglan [metoclopramide]; and Tape   Social History   Social History  . Marital status: Married    Spouse name: N/A  . Number of children: N/A  . Years of education: N/A   Social History Main Topics  . Smoking status: Never Smoker  . Smokeless tobacco: Never Used  . Alcohol use No  . Drug use: No  . Sexual activity: Not Asked   Other Topics Concern  . None   Social History Narrative  . None     Family History: The patient's family history is not on file.  ROS:   Please see the history of present illness.    All other systems reviewed and are negative.  EKGs/Labs/Other Studies Reviewed:    The following studies were reviewed today: I reviewed previous records extensively. I reviewed the lab work done at the previous doctor's office and discuss this with him extensively and had questions answered to his satisfaction.   Recent Labs: No results found for requested labs within last 8760 hours.  Recent Lipid Panel No results found for: CHOL, TRIG, HDL, CHOLHDL, VLDL, LDLCALC, LDLDIRECT  Physical Exam:    VS:  BP (!) 146/94    Pulse 60   Ht 5\' 10"  (1.778 m)   Wt 212 lb 1.9 oz (96.2 kg)   SpO2 96%   BMI 30.44 kg/m     Wt Readings from Last 3 Encounters:  11/06/16 212 lb 1.9 oz (96.2 kg)  11/30/13 201 lb 1 oz (91.2 kg)  11/18/13 201 lb 1 oz (91.2 kg)     GEN: Patient is in no acute distress HEENT: Normal NECK: No JVD; No carotid bruits LYMPHATICS: No lymphadenopathy CARDIAC: S1 S2 regular, 2/6 systolic murmur at the apex. RESPIRATORY:  Clear to auscultation without rales, wheezing or rhonchi  ABDOMEN:  Soft, non-tender, non-distended MUSCULOSKELETAL:  No edema; No deformity  SKIN: Warm and dry NEUROLOGIC:  Alert and oriented x 3 PSYCHIATRIC:  Normal affect    Signed, Jenean Lindau, MD  11/06/2016 9:14 AM    [facility]

## 2016-12-27 ENCOUNTER — Other Ambulatory Visit: Payer: Self-pay

## 2016-12-27 DIAGNOSIS — E782 Mixed hyperlipidemia: Secondary | ICD-10-CM

## 2016-12-27 DIAGNOSIS — I2510 Atherosclerotic heart disease of native coronary artery without angina pectoris: Secondary | ICD-10-CM

## 2016-12-27 DIAGNOSIS — I10 Essential (primary) hypertension: Secondary | ICD-10-CM

## 2016-12-28 LAB — CBC WITH DIFFERENTIAL/PLATELET
BASOS: 0 %
Basophils Absolute: 0 10*3/uL (ref 0.0–0.2)
EOS (ABSOLUTE): 0.2 10*3/uL (ref 0.0–0.4)
EOS: 3 %
Hematocrit: 53.4 % — ABNORMAL HIGH (ref 37.5–51.0)
Hemoglobin: 17.7 g/dL (ref 13.0–17.7)
Immature Grans (Abs): 0 10*3/uL (ref 0.0–0.1)
Immature Granulocytes: 0 %
LYMPHS ABS: 1.4 10*3/uL (ref 0.7–3.1)
Lymphs: 25 %
MCH: 31.2 pg (ref 26.6–33.0)
MCHC: 33.1 g/dL (ref 31.5–35.7)
MCV: 94 fL (ref 79–97)
MONOS ABS: 0.4 10*3/uL (ref 0.1–0.9)
Monocytes: 8 %
NEUTROS ABS: 3.4 10*3/uL (ref 1.4–7.0)
Neutrophils: 64 %
PLATELETS: 160 10*3/uL (ref 150–379)
RBC: 5.67 x10E6/uL (ref 4.14–5.80)
RDW: 15.3 % (ref 12.3–15.4)
WBC: 5.4 10*3/uL (ref 3.4–10.8)

## 2016-12-28 LAB — BASIC METABOLIC PANEL
BUN/Creatinine Ratio: 10 (ref 10–24)
BUN: 16 mg/dL (ref 8–27)
CO2: 23 mmol/L (ref 20–29)
CREATININE: 1.55 mg/dL — AB (ref 0.76–1.27)
Calcium: 9.5 mg/dL (ref 8.6–10.2)
Chloride: 101 mmol/L (ref 96–106)
GFR calc Af Amer: 54 mL/min/{1.73_m2} — ABNORMAL LOW (ref >59)
GFR calc non Af Amer: 47 mL/min/{1.73_m2} — ABNORMAL LOW (ref >59)
Glucose: 115 mg/dL — ABNORMAL HIGH (ref 65–99)
Potassium: 4.3 mmol/L (ref 3.5–5.2)
Sodium: 141 mmol/L (ref 134–144)

## 2016-12-28 LAB — HEPATIC FUNCTION PANEL
ALBUMIN: 4.1 g/dL (ref 3.6–4.8)
ALK PHOS: 70 IU/L (ref 39–117)
ALT: 26 IU/L (ref 0–44)
AST: 35 IU/L (ref 0–40)
BILIRUBIN, DIRECT: 0.17 mg/dL (ref 0.00–0.40)
Bilirubin Total: 0.5 mg/dL (ref 0.0–1.2)
Total Protein: 6.5 g/dL (ref 6.0–8.5)

## 2016-12-28 LAB — LIPID PANEL
Chol/HDL Ratio: 4.4 ratio (ref 0.0–5.0)
Cholesterol, Total: 163 mg/dL (ref 100–199)
HDL: 37 mg/dL — AB (ref >39)
LDL Calculated: 103 mg/dL — ABNORMAL HIGH (ref 0–99)
Triglycerides: 114 mg/dL (ref 0–149)
VLDL Cholesterol Cal: 23 mg/dL (ref 5–40)

## 2017-01-03 ENCOUNTER — Telehealth: Payer: Self-pay | Admitting: Cardiology

## 2017-01-03 NOTE — Telephone Encounter (Signed)
Returning your call. °

## 2017-01-03 NOTE — Telephone Encounter (Signed)
Spoke with pt about his lab results and medication change. Pt stated that he is taking 40 mg of Crestor. According to the med list we have now it's listed as 10 mg. Would you like pt to still double to 80 mg???

## 2017-01-04 ENCOUNTER — Other Ambulatory Visit: Payer: Self-pay

## 2017-01-04 MED ORDER — EZETIMIBE 10 MG PO TABS: 10.0000 mg | ORAL_TABLET | Freq: Every day | ORAL | 0 refills | Status: DC

## 2017-01-04 MED ORDER — ROSUVASTATIN CALCIUM 40 MG PO TABS: 40.0000 mg | ORAL_TABLET | Freq: Every day | ORAL | Status: DC

## 2017-01-04 NOTE — Telephone Encounter (Signed)
Attempted to informed pt of this LM to call back.

## 2017-01-04 NOTE — Telephone Encounter (Signed)
Pt informed of medication adjustment and rx has been sent to Pharmacy

## 2017-01-23 ENCOUNTER — Other Ambulatory Visit: Payer: Self-pay

## 2017-01-23 DIAGNOSIS — I2510 Atherosclerotic heart disease of native coronary artery without angina pectoris: Secondary | ICD-10-CM

## 2017-01-23 MED ORDER — OMEGA-3-ACID ETHYL ESTERS 1 G PO CAPS: 2.0000 g | ORAL_CAPSULE | Freq: Two times a day (BID) | ORAL | 3 refills | Status: DC

## 2017-01-23 MED ORDER — OMEGA-3-ACID ETHYL ESTERS 1 G PO CAPS: 1.0000 g | ORAL_CAPSULE | Freq: Two times a day (BID) | ORAL | 5 refills | Status: DC

## 2017-01-28 ENCOUNTER — Telehealth: Payer: Self-pay

## 2017-01-28 DIAGNOSIS — E782 Mixed hyperlipidemia: Secondary | ICD-10-CM

## 2017-01-28 NOTE — Telephone Encounter (Signed)
-----   Message from Stark sent at 01/04/2017  4:34 PM EDT ----- Regarding: Labs  Pt needs 6 weeks labs for liver lipid check since starting Zetia

## 2017-01-28 NOTE — Telephone Encounter (Signed)
Called pt to remind him of his 6 weeks lab check for liver lipid since starting Zeita. Pt states that he will come here to the office one day this week to have it done. Order will be put in.

## 2017-02-01 LAB — LIPID PANEL
Chol/HDL Ratio: 3.3 ratio (ref 0.0–5.0)
Cholesterol, Total: 117 mg/dL (ref 100–199)
HDL: 36 mg/dL — AB (ref >39)
LDL CALC: 64 mg/dL (ref 0–99)
Triglycerides: 84 mg/dL (ref 0–149)
VLDL CHOLESTEROL CAL: 17 mg/dL (ref 5–40)

## 2017-02-01 LAB — HEPATIC FUNCTION PANEL
ALT: 32 IU/L (ref 0–44)
AST: 34 IU/L (ref 0–40)
Albumin: 4.4 g/dL (ref 3.6–4.8)
Alkaline Phosphatase: 72 IU/L (ref 39–117)
BILIRUBIN TOTAL: 0.5 mg/dL (ref 0.0–1.2)
BILIRUBIN, DIRECT: 0.18 mg/dL (ref 0.00–0.40)
TOTAL PROTEIN: 6.6 g/dL (ref 6.0–8.5)

## 2017-04-16 HISTORY — PX: COLONOSCOPY WITH ESOPHAGOGASTRODUODENOSCOPY (EGD): SHX5779

## 2017-05-09 ENCOUNTER — Other Ambulatory Visit: Payer: Self-pay

## 2017-05-09 MED ORDER — EZETIMIBE 10 MG PO TABS: 10.0000 mg | ORAL_TABLET | Freq: Every day | ORAL | 1 refills | Status: DC

## 2017-05-09 MED ORDER — ROSUVASTATIN CALCIUM 40 MG PO TABS: 40.0000 mg | ORAL_TABLET | Freq: Every day | ORAL | 1 refills | Status: DC

## 2017-05-10 ENCOUNTER — Other Ambulatory Visit: Payer: Self-pay | Admitting: *Deleted

## 2017-05-10 MED ORDER — ROSUVASTATIN CALCIUM 40 MG PO TABS: 40.0000 mg | ORAL_TABLET | Freq: Every day | ORAL | 1 refills | Status: DC

## 2017-05-14 ENCOUNTER — Other Ambulatory Visit: Payer: Self-pay

## 2017-05-14 MED ORDER — ROSUVASTATIN CALCIUM 40 MG PO TABS: 40.0000 mg | ORAL_TABLET | Freq: Every day | ORAL | 1 refills | Status: DC

## 2017-05-24 ENCOUNTER — Other Ambulatory Visit: Payer: Self-pay | Admitting: Cardiology

## 2017-05-24 ENCOUNTER — Ambulatory Visit (INDEPENDENT_AMBULATORY_CARE_PROVIDER_SITE_OTHER): Payer: Medicare Other | Admitting: Cardiology

## 2017-05-24 ENCOUNTER — Encounter: Payer: Self-pay | Admitting: Cardiology

## 2017-05-24 VITALS — BP 132/80 | HR 66 | Ht 70.0 in | Wt 216.0 lb

## 2017-05-24 DIAGNOSIS — I2510 Atherosclerotic heart disease of native coronary artery without angina pectoris: Secondary | ICD-10-CM

## 2017-05-24 DIAGNOSIS — I10 Essential (primary) hypertension: Secondary | ICD-10-CM

## 2017-05-24 DIAGNOSIS — I739 Peripheral vascular disease, unspecified: Secondary | ICD-10-CM | POA: Diagnosis not present

## 2017-05-24 DIAGNOSIS — Z1329 Encounter for screening for other suspected endocrine disorder: Secondary | ICD-10-CM | POA: Diagnosis not present

## 2017-05-24 DIAGNOSIS — R42 Dizziness and giddiness: Secondary | ICD-10-CM

## 2017-05-24 DIAGNOSIS — E782 Mixed hyperlipidemia: Secondary | ICD-10-CM

## 2017-05-24 DIAGNOSIS — G4733 Obstructive sleep apnea (adult) (pediatric): Secondary | ICD-10-CM

## 2017-05-24 DIAGNOSIS — E785 Hyperlipidemia, unspecified: Secondary | ICD-10-CM | POA: Diagnosis not present

## 2017-05-24 NOTE — Patient Instructions (Signed)
Medication Instructions:  Your physician recommends that you continue on your current medications as directed. Please refer to the Current Medication list given to you today.  Labwork: Your physician recommends that you have the following labs drawn: BMP, CBC, TSH, and liver/lipid panel  Testing/Procedures: Your physician has recommended that you wear a holter monitor. Holter monitors are medical devices that record the heart's electrical activity. Doctors most often use these monitors to diagnose arrhythmias. Arrhythmias are problems with the speed or rhythm of the heartbeat. The monitor is a small, portable device. You can wear one while you do your normal daily activities. This is usually used to diagnose what is causing palpitations/syncope (passing out).  Your physician has requested that you have en exercise stress myoview. For further information please visit [email or web address]. Please follow instruction sheet, as given.  Follow-Up: Your physician recommends that you schedule a follow-up appointment in: 3 months  Any Other Special Instructions Will Be Listed Below (If Applicable).     If you need a refill on your cardiac medications before your next appointment, please call your pharmacy.   [place], RN, BSN    Holter Monitoring A Holter monitor is a small device that is used to detect abnormal heart rhythms. It clips to your clothing and is connected by wires to flat, sticky disks (electrodes) that attach to your chest. It is worn continuously for 24-48 hours. Follow these instructions at home:  Wear your Holter monitor at all times, even while exercising and sleeping, for as long as directed by your health care provider.  Make sure that the Holter monitor is safely clipped to your clothing or close to your body as recommended by your health care provider.  Do not get the monitor or wires wet.  Do not put body lotion or moisturizer on your  chest.  Keep your skin clean.  Keep a diary of your daily activities, such as walking and doing chores. If you feel that your heartbeat is abnormal or that your heart is fluttering or skipping a beat: ? Record what you are doing when it happens. ? Record what time of day the symptoms occur.  Return your Holter monitor as directed by your health care provider.  Keep all follow-up visits as directed by your health care provider. This is important. Get help right away if:  You feel lightheaded or you faint.  You have trouble breathing.  You feel pain in your chest, upper arm, or jaw.  You feel sick to your stomach and your skin is pale, cool, or damp.  You heartbeat feels unusual or abnormal. This information is not intended to replace advice given to you by your health care provider. Make sure you discuss any questions you have with your health care provider. Document Released: 12/30/2003 Document Revised: 09/08/2015 Document Reviewed: 11/09/2013 Elsevier Interactive Patient Education  [employer].

## 2017-05-24 NOTE — Progress Notes (Signed)
Cardiology Office Note:    Date:  05/24/2017   ID:  [name], DOB 03-12-1951, MRN [id number]  PCP:  Algis Greenhouse, MD  Cardiologist:  Jenean Lindau, MD   Referring MD: Algis Greenhouse, MD    ASSESSMENT:    1. Coronary artery disease involving native coronary artery of native heart without angina pectoris   2. Essential hypertension   3. Peripheral arterial disease ([place])   4. Obstructive sleep apnea   5. Mixed dyslipidemia   6. Dizziness   7. Screening for thyroid disorder    PLAN:    In order of problems listed above:  1. Secondary prevention stressed with the patient.  Importance of compliance with diet and medications stressed and he vocalized understanding. 2. In view of his chest tightness we will do a exercise stress Cardiolite.  Patient mentions to me that he is having recurring dizziness episodes.  He has had them in the remote past.  No syncope.  I would like to get a 48-hour Holter monitoring to see what his baseline heart rate is.  It will also help me assess if he is getting bradycardic at times. 3. He is fasting today and we will do blood work including lipids. 4. Patient will be seen in follow-up appointment in 3 months or earlier if the patient has any concerns   Medication Adjustments/Labs and Tests Ordered: Current medicines are reviewed at length with the patient today.  Concerns regarding medicines are outlined above.  Orders Placed This Encounter  Procedures  . Basic metabolic panel  . CBC with Differential/Platelet  . TSH  . Hepatic function panel  . Lipid panel  . Holter monitor - 48 hour  . MYOCARDIAL PERFUSION IMAGING  . EKG 12-Lead   No orders of the defined types were placed in this encounter.    Chief Complaint  Patient presents with  . Follow-up  . Coronary Artery Disease     History of Present Illness:    Clarence Schneider is a 67 y.o. male.  The patient has known coronary artery disease.  He mentions to me that he  occasionally has chest tightness when aches he exerts himself more than usual.  No orthopnea or PND.  He is a little concerned about his symptoms.  He mentions to me that he does not exercise on a regular basis.  His stress test was about 2 years ago according to the patient.  At the time of my evaluation, the patient is alert awake oriented and in no distress.  Past Medical History:  Diagnosis Date  . Arthritis   . Coronary artery disease    stents  . Dizziness   . Esophageal reflux   . Headache(784.0)   . Hyperlipemia   . Hypertension   . Lightheadedness    doesn't occur everyday; happens at times; once occured while walking up a incline  . Neuromuscular disorder (HCC)    Takes Gabapentin  . Pinched nerve    in back  . Shortness of breath    with exertion  . Skin abrasion    under both arms; uses Lotrisone cream twice a day  . Sleep apnea    cpap doesnt use reg    Past Surgical History:  Procedure Laterality Date  . APPENDECTOMY    . CARDIAC CATHETERIZATION     X 3 stents  . CARPAL TUNNEL RELEASE Bilateral   . COLONOSCOPY W/ POLYPECTOMY    . ELBOW ARTHROSCOPY Bilateral  ulner nerve   2 surgeries  . INGUINAL HERNIA REPAIR Right   . KNEE ARTHROSCOPY Bilateral    Left x 1 Right X 2  . SHOULDER ARTHROSCOPY Left   . TONSILLECTOMY    . TOTAL KNEE ARTHROPLASTY Left 05/11/2013   Procedure: LEFT TOTAL KNEE ARTHROPLASTY;  Surgeon: Vickey Huger, MD;  Location: [facility];  Service: Orthopedics;  Laterality: Left;  . TOTAL KNEE ARTHROPLASTY Right 11/30/2013   dr Ronnie Derby  . TOTAL KNEE ARTHROPLASTY Right 11/30/2013   Procedure: TOTAL KNEE ARTHROPLASTY;  Surgeon: Vickey Huger, MD;  Location: [facility];  Service: Orthopedics;  Laterality: Right;    Current Medications: Current Meds  Medication Sig  . hydrochlorothiazide (HYDRODIURIL) 12.5 MG tablet 6.25 mg every morning. High blood pressure     Allergies:   Hydrocodone-acetaminophen; Oxycodone; Reglan [metoclopramide]; and Tape   Social  History   Socioeconomic History  . Marital status: Married    Spouse name: None  . Number of children: None  . Years of education: None  . Highest education level: None  Social Needs  . Financial resource strain: None  . Food insecurity - worry: None  . Food insecurity - inability: None  . Transportation needs - medical: None  . Transportation needs - non-medical: None  Occupational History  . None  Tobacco Use  . Smoking status: Never Smoker  . Smokeless tobacco: Never Used  Substance and Sexual Activity  . Alcohol use: No  . Drug use: No  . Sexual activity: None  Other Topics Concern  . None  Social History Narrative  . None     Family History: The patient's family history is not on file.  ROS:   Please see the history of present illness.    All other systems reviewed and are negative.  EKGs/Labs/Other Studies Reviewed:    The following studies were reviewed today: EKG done today reveals sinus rhythm and nonspecific ST-T changes   Recent Labs: 12/27/2016: BUN 16; Creatinine, Ser 1.55; Hemoglobin 17.7; Platelets 160; Potassium 4.3; Sodium 141 01/31/2017: ALT 32  Recent Lipid Panel    Component Value Date/Time   CHOL 117 01/31/2017 0914   TRIG 84 01/31/2017 0914   HDL 36 (L) 01/31/2017 0914   CHOLHDL 3.3 01/31/2017 0914   LDLCALC 64 01/31/2017 0914    Physical Exam:    VS:  BP 132/80 (BP Location: Left Arm, Patient Position: Sitting, Cuff Size: Normal)   Pulse 66   Ht 5\' 10"  (1.778 m)   Wt 216 lb (98 kg)   SpO2 98%   BMI 30.99 kg/m     Wt Readings from Last 3 Encounters:  05/24/17 216 lb (98 kg)  11/06/16 212 lb 1.9 oz (96.2 kg)  11/30/13 201 lb 1 oz (91.2 kg)     GEN: Patient is in no acute distress HEENT: Normal NECK: No JVD; No carotid bruits LYMPHATICS: No lymphadenopathy CARDIAC: Hear sounds regular, 2/6 systolic murmur at the apex. RESPIRATORY:  Clear to auscultation without rales, wheezing or rhonchi  ABDOMEN: Soft, non-tender,  non-distended MUSCULOSKELETAL:  No edema; No deformity  SKIN: Warm and dry NEUROLOGIC:  Alert and oriented x 3 PSYCHIATRIC:  Normal affect   Signed, Jenean Lindau, MD  05/24/2017 11:09 AM    [facility]

## 2017-05-25 LAB — CBC WITH DIFFERENTIAL/PLATELET
BASOS ABS: 0 10*3/uL (ref 0.0–0.2)
BASOS: 1 %
EOS (ABSOLUTE): 0.2 10*3/uL (ref 0.0–0.4)
Eos: 3 %
HEMOGLOBIN: 16 g/dL (ref 13.0–17.7)
Hematocrit: 47.9 % (ref 37.5–51.0)
IMMATURE GRANS (ABS): 0 10*3/uL (ref 0.0–0.1)
Immature Granulocytes: 0 %
LYMPHS ABS: 1.5 10*3/uL (ref 0.7–3.1)
LYMPHS: 26 %
MCH: 31.4 pg (ref 26.6–33.0)
MCHC: 33.4 g/dL (ref 31.5–35.7)
MCV: 94 fL (ref 79–97)
Monocytes Absolute: 0.8 10*3/uL (ref 0.1–0.9)
Monocytes: 14 %
NEUTROS ABS: 3.3 10*3/uL (ref 1.4–7.0)
Neutrophils: 56 %
Platelets: 193 10*3/uL (ref 150–379)
RBC: 5.1 x10E6/uL (ref 4.14–5.80)
RDW: 16.1 % — ABNORMAL HIGH (ref 12.3–15.4)
WBC: 5.8 10*3/uL (ref 3.4–10.8)

## 2017-05-25 LAB — LIPID PANEL
CHOLESTEROL TOTAL: 141 mg/dL (ref 100–199)
Chol/HDL Ratio: 4.1 ratio (ref 0.0–5.0)
HDL: 34 mg/dL — ABNORMAL LOW (ref >39)
LDL Calculated: 69 mg/dL (ref 0–99)
Triglycerides: 192 mg/dL — ABNORMAL HIGH (ref 0–149)
VLDL Cholesterol Cal: 38 mg/dL (ref 5–40)

## 2017-05-25 LAB — BASIC METABOLIC PANEL
BUN / CREAT RATIO: 8 — AB (ref 10–24)
BUN: 12 mg/dL (ref 8–27)
CHLORIDE: 101 mmol/L (ref 96–106)
CO2: 25 mmol/L (ref 20–29)
Calcium: 9.5 mg/dL (ref 8.6–10.2)
Creatinine, Ser: 1.46 mg/dL — ABNORMAL HIGH (ref 0.76–1.27)
GFR, EST AFRICAN AMERICAN: 58 mL/min/{1.73_m2} — AB (ref >59)
GFR, EST NON AFRICAN AMERICAN: 50 mL/min/{1.73_m2} — AB (ref >59)
Glucose: 111 mg/dL — ABNORMAL HIGH (ref 65–99)
POTASSIUM: 4.4 mmol/L (ref 3.5–5.2)
Sodium: 141 mmol/L (ref 134–144)

## 2017-05-25 LAB — HEPATIC FUNCTION PANEL
ALT: 31 IU/L (ref 0–44)
AST: 29 IU/L (ref 0–40)
Albumin: 4.4 g/dL (ref 3.6–4.8)
Alkaline Phosphatase: 99 IU/L (ref 39–117)
BILIRUBIN TOTAL: 0.5 mg/dL (ref 0.0–1.2)
BILIRUBIN, DIRECT: 0.18 mg/dL (ref 0.00–0.40)
Total Protein: 6.7 g/dL (ref 6.0–8.5)

## 2017-05-25 LAB — TSH: TSH: 3.21 u[IU]/mL (ref 0.450–4.500)

## 2017-05-27 ENCOUNTER — Other Ambulatory Visit: Payer: Self-pay

## 2017-05-27 DIAGNOSIS — R7989 Other specified abnormal findings of blood chemistry: Secondary | ICD-10-CM

## 2017-05-27 DIAGNOSIS — IMO0002 Reserved for concepts with insufficient information to code with codable children: Secondary | ICD-10-CM

## 2017-05-27 DIAGNOSIS — E781 Pure hyperglyceridemia: Secondary | ICD-10-CM

## 2017-05-30 ENCOUNTER — Telehealth (HOSPITAL_COMMUNITY): Payer: Self-pay | Admitting: *Deleted

## 2017-05-30 NOTE — Telephone Encounter (Signed)
Left message on voicemail per DPR in reference to upcoming appointment scheduled on 06/03/17 with detailed instructions given per Myocardial Perfusion Study Information Sheet for the test. LM to arrive 15 minutes early, and that it is imperative to arrive on time for appointment to keep from having the test rescheduled. If you need to cancel or reschedule your appointment, please call the office within 24 hours of your appointment. Failure to do so may result in a cancellation of your appointment, and a $50 no show fee. Phone number given for call back for any questions. Phylisha Dix Jacqueline   

## 2017-06-03 ENCOUNTER — Other Ambulatory Visit: Payer: Self-pay | Admitting: Cardiology

## 2017-06-03 ENCOUNTER — Ambulatory Visit: Payer: Medicare Other

## 2017-06-03 ENCOUNTER — Ambulatory Visit (HOSPITAL_COMMUNITY): Payer: Medicare Other | Attending: Cardiology

## 2017-06-03 VITALS — Ht 70.0 in | Wt 216.0 lb

## 2017-06-03 DIAGNOSIS — I2510 Atherosclerotic heart disease of native coronary artery without angina pectoris: Secondary | ICD-10-CM | POA: Diagnosis not present

## 2017-06-03 DIAGNOSIS — I10 Essential (primary) hypertension: Secondary | ICD-10-CM | POA: Diagnosis present

## 2017-06-03 DIAGNOSIS — R079 Chest pain, unspecified: Secondary | ICD-10-CM

## 2017-06-03 LAB — MYOCARDIAL PERFUSION IMAGING
CHL CUP NUCLEAR SSS: 3
CHL CUP RESTING HR STRESS: 55 {beats}/min
LHR: 0.32
LVDIAVOL: 125 mL (ref 62–150)
LVSYSVOL: 59 mL
NUC STRESS TID: 1.04
Peak HR: 75 {beats}/min
SDS: 3
SRS: 0

## 2017-06-03 MED ORDER — TECHNETIUM TC 99M TETROFOSMIN IV KIT
31.5000 | PACK | Freq: Once | INTRAVENOUS | Status: AC | PRN
  Administered 2017-06-03: 31.5 via INTRAVENOUS
  Filled 2017-06-03: qty 32

## 2017-06-03 MED ORDER — TECHNETIUM TC 99M TETROFOSMIN IV KIT
10.4000 | PACK | Freq: Once | INTRAVENOUS | Status: AC | PRN
  Administered 2017-06-03: 10.4 via INTRAVENOUS
  Filled 2017-06-03: qty 11

## 2017-06-03 MED ORDER — REGADENOSON 0.4 MG/5ML IV SOLN
0.4000 mg | Freq: Once | INTRAVENOUS | Status: AC
  Administered 2017-06-03: 0.4 mg via INTRAVENOUS

## 2017-06-04 LAB — MYOCARDIAL PERFUSION IMAGING
CHL CUP NUCLEAR SSS: 3
LVDIAVOL: 125 mL (ref 62–150)
LVSYSVOL: 59 mL
Peak HR: 75 {beats}/min
RATE: 0.32
Rest HR: 55 {beats}/min
SDS: 3
SRS: 0
TID: 1.04

## 2017-06-04 NOTE — Addendum Note (Signed)
Addended by: Mattie Marlin on: 06/04/2017 01:38 PM   Modules accepted: Orders

## 2017-06-04 NOTE — Addendum Note (Signed)
Addended by: Mattie Marlin on: 06/04/2017 01:46 PM   Modules accepted: Orders

## 2017-06-11 ENCOUNTER — Other Ambulatory Visit: Payer: Self-pay

## 2017-06-11 MED ORDER — HYDROCHLOROTHIAZIDE 12.5 MG PO TABS: ORAL_TABLET | ORAL | 3 refills | Status: DC

## 2017-06-13 ENCOUNTER — Other Ambulatory Visit: Payer: Self-pay

## 2017-06-13 MED ORDER — HYDROCHLOROTHIAZIDE 12.5 MG PO TABS: ORAL_TABLET | ORAL | 3 refills | Status: DC

## 2017-06-14 ENCOUNTER — Other Ambulatory Visit: Payer: Self-pay

## 2017-06-14 ENCOUNTER — Ambulatory Visit: Payer: Medicare Other | Admitting: Cardiology

## 2017-06-14 ENCOUNTER — Encounter: Payer: Self-pay | Admitting: Cardiology

## 2017-06-14 ENCOUNTER — Telehealth: Payer: Self-pay | Admitting: Cardiology

## 2017-06-14 ENCOUNTER — Ambulatory Visit (HOSPITAL_BASED_OUTPATIENT_CLINIC_OR_DEPARTMENT_OTHER)
Admission: RE | Admit: 2017-06-14 | Discharge: 2017-06-14 | Disposition: A | Discharge status: Home / Self Care | Payer: Medicare Other | Source: Ambulatory Visit | Attending: Cardiology | Admitting: Cardiology

## 2017-06-14 VITALS — BP 134/70 | HR 62 | Ht 70.0 in | Wt 220.0 lb

## 2017-06-14 DIAGNOSIS — E782 Mixed hyperlipidemia: Secondary | ICD-10-CM

## 2017-06-14 DIAGNOSIS — I4729 Other ventricular tachycardia: Secondary | ICD-10-CM

## 2017-06-14 DIAGNOSIS — I472 Ventricular tachycardia: Secondary | ICD-10-CM

## 2017-06-14 DIAGNOSIS — R001 Bradycardia, unspecified: Secondary | ICD-10-CM | POA: Diagnosis not present

## 2017-06-14 DIAGNOSIS — I10 Essential (primary) hypertension: Secondary | ICD-10-CM

## 2017-06-14 DIAGNOSIS — I2510 Atherosclerotic heart disease of native coronary artery without angina pectoris: Secondary | ICD-10-CM

## 2017-06-14 DIAGNOSIS — G4733 Obstructive sleep apnea (adult) (pediatric): Secondary | ICD-10-CM | POA: Diagnosis not present

## 2017-06-14 DIAGNOSIS — R9439 Abnormal result of other cardiovascular function study: Secondary | ICD-10-CM | POA: Diagnosis not present

## 2017-06-14 HISTORY — DX: Other ventricular tachycardia: I47.29

## 2017-06-14 HISTORY — DX: Bradycardia, unspecified: R00.1

## 2017-06-14 HISTORY — DX: Ventricular tachycardia: I47.2

## 2017-06-14 MED ORDER — HYDROCHLOROTHIAZIDE 12.5 MG PO TABS: 12.5000 mg | ORAL_TABLET | Freq: Every day | ORAL | 1 refills | Status: DC

## 2017-06-14 NOTE — Addendum Note (Signed)
Addended by: Mattie Marlin on: 06/14/2017 10:09 AM   Modules accepted: Orders

## 2017-06-14 NOTE — Telephone Encounter (Signed)
Informed patient of dose increase with HCTZ as well as the increased need for potassium in the diet.

## 2017-06-14 NOTE — H&P (View-Only) (Signed)
Cardiology Office Note:    Date:  06/14/2017   ID:  Clarence Schneider, DOB May 02, 1950, MRN [id number]  PCP:  Algis Greenhouse, MD  Cardiologist:  Jenean Lindau, MD   Referring MD: Algis Greenhouse, MD    ASSESSMENT:    1. Abnormal nuclear stress test   2. Coronary artery disease involving native coronary artery of native heart without angina pectoris   3. Essential hypertension   4. Obstructive sleep apnea   5. Mixed dyslipidemia   6. Slow heart rate   7. Nonsustained ventricular tachycardia (HCC)    PLAN:    In order of problems listed above:  1. Secondary prevention stressed with the patient.  Importance of compliance with diet and medications stressed and he vocalized understanding. 2. In view of multiple abnormalities on testing I recommended coronary angiography.I discussed coronary angiography and left heart catheterization with the patient at extensive length. Procedure, benefits and potential risks were explained. Patient had multiple questions which were answered to the patient's satisfaction. Patient agreed and consented for the procedure. Further recommendations will be made based on the findings of the coronary angiography. In the interim. The patient has any significant symptoms he knows to go to the nearest emergency room. 3. I also want him to be evaluated by our electrophysiology colleagues.  He has significant bradycardia.  He is on a beta-blocker therapy and I do not want to change or reduce the dose because of nonsustained ventricular tachycardia and coronary artery disease.  Fortunately systolic function is within normal limits. 4. Patient will be seen in follow-up appointment in 4 weeks or earlier if the patient has any concerns    Medication Adjustments/Labs and Tests Ordered: Current medicines are reviewed at length with the patient today.  Concerns regarding medicines are outlined above.  No orders of the defined types were placed in this encounter.  No  orders of the defined types were placed in this encounter.    Chief Complaint  Patient presents with  . Follow-up     History of Present Illness:    Clarence Schneider is a 67 y.o. male.  I followed this gentleman for a long period of time.  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  Subsequently splint and doing fine he has been active.  His stress test recently was abnormal and the abnormality was mild and therefore this was considered a low risk scan and medical therapy was recommended.  He complains of dizziness at times and therefore I did Holter monitoring.  Holter monitoring revealed significant bradycardia during daytime which was very transient and asymptomatic.  He also had nonsustained ventricular tachycardia.  In view of these multiple findings I discussed with him the following.  Past Medical History:  Diagnosis Date  . Arthritis   . Coronary artery disease    stents  . Dizziness   . Esophageal reflux   . Headache(784.0)   . Hyperlipemia   . Hypertension   . Lightheadedness    doesn't occur everyday; happens at times; once occured while walking up a incline  . Neuromuscular disorder (HCC)    Takes Gabapentin  . Pinched nerve    in back  . Shortness of breath    with exertion  . Skin abrasion    under both arms; uses Lotrisone cream twice a day  . Sleep apnea    cpap doesnt use reg    Past Surgical History:  Procedure Laterality Date  . APPENDECTOMY    . CARDIAC CATHETERIZATION     X 3 stents  . CARPAL TUNNEL RELEASE Bilateral   . COLONOSCOPY W/ POLYPECTOMY    . ELBOW ARTHROSCOPY Bilateral    ulner nerve   2 surgeries  . INGUINAL HERNIA REPAIR Right   . KNEE ARTHROSCOPY Bilateral    Left x 1 Right X 2  . SHOULDER ARTHROSCOPY Left   . TONSILLECTOMY    . TOTAL KNEE ARTHROPLASTY Left 05/11/2013   Procedure: LEFT TOTAL KNEE ARTHROPLASTY;  Surgeon: Vickey Huger, MD;  Location: [facility];   Service: Orthopedics;  Laterality: Left;  . TOTAL KNEE ARTHROPLASTY Right 11/30/2013   dr Ronnie Derby  . TOTAL KNEE ARTHROPLASTY Right 11/30/2013   Procedure: TOTAL KNEE ARTHROPLASTY;  Surgeon: Vickey Huger, MD;  Location: [facility];  Service: Orthopedics;  Laterality: Right;    Current Medications: Current Meds  Medication Sig  . amoxicillin (AMOXIL) 500 MG capsule   . aspirin 81 MG EC tablet Take 81 mg by mouth daily.   . carvedilol (COREG) 6.25 MG tablet Take 6.25 mg by mouth 2 (two) times daily with a meal. 1.5 tablets  . clopidogrel (PLAVIX) 75 MG tablet Take 75 mg by mouth daily.  Marland Kitchen ezetimibe (ZETIA) 10 MG tablet Take 1 tablet (10 mg total) by mouth daily.  . fenofibrate 160 MG tablet   . hydrochlorothiazide (HYDRODIURIL) 12.5 MG tablet 6.25 mg every morning. High blood pressure  . Multiple Vitamins-Minerals (MULTIVITAMIN WITH MINERALS) tablet Take 1 tablet by mouth daily.  . nabumetone (RELAFEN) 500 MG tablet Take 3 tablets by mouth daily.  Marland Kitchen omega-3 acid ethyl esters (LOVAZA) 1 g capsule Take 2 capsules (2 g total) by mouth 2 (two) times daily.  Marland Kitchen pyridoxine (B-6) 200 MG tablet Take 200 mg by mouth daily.  . rosuvastatin (CRESTOR) 40 MG tablet Take 1 tablet (40 mg total) by mouth daily.  . sildenafil (REVATIO) 20 MG tablet Take 20 mg by mouth.  . telmisartan (MICARDIS) 80 MG tablet Take by mouth.  . telmisartan-hydrochlorothiazide (MICARDIS HCT) 80-12.5 MG tablet Take 0.5 tablets by mouth daily.  Marland Kitchen testosterone cypionate (DEPOTESTOTERONE CYPIONATE) 200 MG/ML injection Inject 300 mg into the muscle every 14 (fourteen) days. 1.5 ml (300 mg) every 2 weeks  . vitamin B-12 (CYANOCOBALAMIN) 1000 MCG tablet Take 1,000 mcg by mouth daily.     Allergies:   Hydrocodone-acetaminophen; Oxycodone; Reglan [metoclopramide]; and Tape   Social History   Socioeconomic History  . Marital status: Married    Spouse name: None  . Number of children: None  . Years of education: None  . Highest education  level: None  Social Needs  . Financial resource strain: None  . Food insecurity - worry: None  . Food insecurity - inability: None  . Transportation needs - medical: None  . Transportation needs - non-medical: None  Occupational History  . None  Tobacco Use  . Smoking status: Never Smoker  . Smokeless tobacco: Never Used  Substance and Sexual Activity  . Alcohol use: No  . Drug use: No  . Sexual activity: None  Other Topics Concern  . None  Social History Narrative  . None     Family History: The patient's family history is not on file.  ROS:   Please see the history of present illness.    All other systems reviewed and are negative.  EKGs/Labs/Other Studies Reviewed:    The following studies were reviewed today: I reviewed the stress  test and Holter monitor report with him at extensive length and he vocalized understanding.   Recent Labs: 05/24/2017: ALT 31; BUN 12; Creatinine, Ser 1.46; Hemoglobin 16.0; Platelets 193; Potassium 4.4; Sodium 141; TSH 3.210  Recent Lipid Panel    Component Value Date/Time   CHOL 141 05/24/2017 1020   TRIG 192 (H) 05/24/2017 1020   HDL 34 (L) 05/24/2017 1020   CHOLHDL 4.1 05/24/2017 1020   LDLCALC 69 05/24/2017 1020    Physical Exam:    VS:  BP 134/70 (BP Location: Right Arm, Patient Position: Sitting, Cuff Size: Normal)   Pulse 62   Ht 5\' 10"  (1.778 m)   Wt 220 lb (99.8 kg)   SpO2 98%   BMI 31.57 kg/m     Wt Readings from Last 3 Encounters:  06/14/17 220 lb (99.8 kg)  06/03/17 216 lb (98 kg)  05/24/17 216 lb (98 kg)     GEN: Patient is in no acute distress HEENT: Normal NECK: No JVD; No carotid bruits LYMPHATICS: No lymphadenopathy CARDIAC: Hear sounds regular, 2/6 systolic murmur at the apex. RESPIRATORY:  Clear to auscultation without rales, wheezing or rhonchi  ABDOMEN: Soft, non-tender, non-distended MUSCULOSKELETAL:  No edema; No deformity  SKIN: Warm and dry NEUROLOGIC:  Alert and oriented x 3 PSYCHIATRIC:   Normal affect   Signed, Jenean Lindau, MD  06/14/2017 9:56 AM    [place]

## 2017-06-14 NOTE — Progress Notes (Signed)
Cardiology Office Note:    Date:  06/14/2017   ID:  Clarence Schneider, DOB 11/21/1950, MRN [id number]  PCP:  Algis Greenhouse, MD  Cardiologist:  Jenean Lindau, MD   Referring MD: Algis Greenhouse, MD    ASSESSMENT:    1. Abnormal nuclear stress test   2. Coronary artery disease involving native coronary artery of native heart without angina pectoris   3. Essential hypertension   4. Obstructive sleep apnea   5. Mixed dyslipidemia   6. Slow heart rate   7. Nonsustained ventricular tachycardia (HCC)    PLAN:    In order of problems listed above:  1. Secondary prevention stressed with the patient.  Importance of compliance with diet and medications stressed and he vocalized understanding. 2. In view of multiple abnormalities on testing I recommended coronary angiography.I discussed coronary angiography and left heart catheterization with the patient at extensive length. Procedure, benefits and potential risks were explained. Patient had multiple questions which were answered to the patient's satisfaction. Patient agreed and consented for the procedure. Further recommendations will be made based on the findings of the coronary angiography. In the interim. The patient has any significant symptoms he knows to go to the nearest emergency room. 3. I also want him to be evaluated by our electrophysiology colleagues.  He has significant bradycardia.  He is on a beta-blocker therapy and I do not want to change or reduce the dose because of nonsustained ventricular tachycardia and coronary artery disease.  Fortunately systolic function is within normal limits. 4. Patient will be seen in follow-up appointment in 4 weeks or earlier if the patient has any concerns    Medication Adjustments/Labs and Tests Ordered: Current medicines are reviewed at length with the patient today.  Concerns regarding medicines are outlined above.  No orders of the defined types were placed in this encounter.  No  orders of the defined types were placed in this encounter.    Chief Complaint  Patient presents with  . Follow-up     History of Present Illness:    Clarence Schneider is a 67 y.o. male.  I followed this gentleman for a long period of time.  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  Subsequently splint and doing fine he has been active.  His stress test recently was abnormal and the abnormality was mild and therefore this was considered a low risk scan and medical therapy was recommended.  He complains of dizziness at times and therefore I did Holter monitoring.  Holter monitoring revealed significant bradycardia during daytime which was very transient and asymptomatic.  He also had nonsustained ventricular tachycardia.  In view of these multiple findings I discussed with him the following.  Past Medical History:  Diagnosis Date  . Arthritis   . Coronary artery disease    stents  . Dizziness   . Esophageal reflux   . Headache(784.0)   . Hyperlipemia   . Hypertension   . Lightheadedness    doesn't occur everyday; happens at times; once occured while walking up a incline  . Neuromuscular disorder (HCC)    Takes Gabapentin  . Pinched nerve    in back  . Shortness of breath    with exertion  . Skin abrasion    under both arms; uses Lotrisone cream twice a day  . Sleep apnea    cpap doesnt use reg    Past Surgical History:  Procedure Laterality Date  . APPENDECTOMY    . CARDIAC CATHETERIZATION     X 3 stents  . CARPAL TUNNEL RELEASE Bilateral   . COLONOSCOPY W/ POLYPECTOMY    . ELBOW ARTHROSCOPY Bilateral    ulner nerve   2 surgeries  . INGUINAL HERNIA REPAIR Right   . KNEE ARTHROSCOPY Bilateral    Left x 1 Right X 2  . SHOULDER ARTHROSCOPY Left   . TONSILLECTOMY    . TOTAL KNEE ARTHROPLASTY Left 05/11/2013   Procedure: LEFT TOTAL KNEE ARTHROPLASTY;  Surgeon: Vickey Huger, MD;  Location: [facility];   Service: Orthopedics;  Laterality: Left;  . TOTAL KNEE ARTHROPLASTY Right 11/30/2013   dr Ronnie Derby  . TOTAL KNEE ARTHROPLASTY Right 11/30/2013   Procedure: TOTAL KNEE ARTHROPLASTY;  Surgeon: Vickey Huger, MD;  Location: [facility];  Service: Orthopedics;  Laterality: Right;    Current Medications: Current Meds  Medication Sig  . amoxicillin (AMOXIL) 500 MG capsule   . aspirin 81 MG EC tablet Take 81 mg by mouth daily.   . carvedilol (COREG) 6.25 MG tablet Take 6.25 mg by mouth 2 (two) times daily with a meal. 1.5 tablets  . clopidogrel (PLAVIX) 75 MG tablet Take 75 mg by mouth daily.  Marland Kitchen ezetimibe (ZETIA) 10 MG tablet Take 1 tablet (10 mg total) by mouth daily.  . fenofibrate 160 MG tablet   . hydrochlorothiazide (HYDRODIURIL) 12.5 MG tablet 6.25 mg every morning. High blood pressure  . Multiple Vitamins-Minerals (MULTIVITAMIN WITH MINERALS) tablet Take 1 tablet by mouth daily.  . nabumetone (RELAFEN) 500 MG tablet Take 3 tablets by mouth daily.  Marland Kitchen omega-3 acid ethyl esters (LOVAZA) 1 g capsule Take 2 capsules (2 g total) by mouth 2 (two) times daily.  Marland Kitchen pyridoxine (B-6) 200 MG tablet Take 200 mg by mouth daily.  . rosuvastatin (CRESTOR) 40 MG tablet Take 1 tablet (40 mg total) by mouth daily.  . sildenafil (REVATIO) 20 MG tablet Take 20 mg by mouth.  . telmisartan (MICARDIS) 80 MG tablet Take by mouth.  . telmisartan-hydrochlorothiazide (MICARDIS HCT) 80-12.5 MG tablet Take 0.5 tablets by mouth daily.  Marland Kitchen testosterone cypionate (DEPOTESTOTERONE CYPIONATE) 200 MG/ML injection Inject 300 mg into the muscle every 14 (fourteen) days. 1.5 ml (300 mg) every 2 weeks  . vitamin B-12 (CYANOCOBALAMIN) 1000 MCG tablet Take 1,000 mcg by mouth daily.     Allergies:   Hydrocodone-acetaminophen; Oxycodone; Reglan [metoclopramide]; and Tape   Social History   Socioeconomic History  . Marital status: Married    Spouse name: None  . Number of children: None  . Years of education: None  . Highest education  level: None  Social Needs  . Financial resource strain: None  . Food insecurity - worry: None  . Food insecurity - inability: None  . Transportation needs - medical: None  . Transportation needs - non-medical: None  Occupational History  . None  Tobacco Use  . Smoking status: Never Smoker  . Smokeless tobacco: Never Used  Substance and Sexual Activity  . Alcohol use: No  . Drug use: No  . Sexual activity: None  Other Topics Concern  . None  Social History Narrative  . None     Family History: The patient's family history is not on file.  ROS:   Please see the history of present illness.    All other systems reviewed and are negative.  EKGs/Labs/Other Studies Reviewed:    The following studies were reviewed today: I reviewed the stress  test and Holter monitor report with him at extensive length and he vocalized understanding.   Recent Labs: 05/24/2017: ALT 31; BUN 12; Creatinine, Ser 1.46; Hemoglobin 16.0; Platelets 193; Potassium 4.4; Sodium 141; TSH 3.210  Recent Lipid Panel    Component Value Date/Time   CHOL 141 05/24/2017 1020   TRIG 192 (H) 05/24/2017 1020   HDL 34 (L) 05/24/2017 1020   CHOLHDL 4.1 05/24/2017 1020   LDLCALC 69 05/24/2017 1020    Physical Exam:    VS:  BP 134/70 (BP Location: Right Arm, Patient Position: Sitting, Cuff Size: Normal)   Pulse 62   Ht 5\' 10"  (1.778 m)   Wt 220 lb (99.8 kg)   SpO2 98%   BMI 31.57 kg/m     Wt Readings from Last 3 Encounters:  06/14/17 220 lb (99.8 kg)  06/03/17 216 lb (98 kg)  05/24/17 216 lb (98 kg)     GEN: Patient is in no acute distress HEENT: Normal NECK: No JVD; No carotid bruits LYMPHATICS: No lymphadenopathy CARDIAC: Hear sounds regular, 2/6 systolic murmur at the apex. RESPIRATORY:  Clear to auscultation without rales, wheezing or rhonchi  ABDOMEN: Soft, non-tender, non-distended MUSCULOSKELETAL:  No edema; No deformity  SKIN: Warm and dry NEUROLOGIC:  Alert and oriented x 3 PSYCHIATRIC:   Normal affect   Signed, Jenean Lindau, MD  06/14/2017 9:56 AM    [place]

## 2017-06-14 NOTE — Patient Instructions (Addendum)
Medication Instructions:  Your physician recommends that you continue on your current medications as directed. Please refer to the Current Medication list given to you today.  Labwork: Your physician recommends that you have the following labs drawn: CBC, BMP, and INR  Testing/Procedures: A chest x-ray takes a picture of the organs and structures inside the chest, including the heart, lungs, and blood vessels. This test can show several things, including, whether the heart is enlarges; whether fluid is building up in the lungs; and whether pacemaker / defibrillator leads are still in place.    [facility] [address], [place] [address] Dept: [phone] Loc: [phone]  Clarence Schneider  06/14/2017  You are scheduled for a Cardiac Catheterization on Friday, March 8 with Dr. Larae Grooms.  1. Please arrive at the [facility] (Main Entrance A) at [facility]: [address] [place], [place] [address] at 8:00 AM (two hours before your procedure to ensure your preparation). Free valet parking service is available.   Special note: Every effort is made to have your procedure done on time. Please understand that emergencies sometimes delay scheduled procedures.  2. Diet: Do not eat or drink anything after midnight prior to your procedure except sips of water to take medications.  3. Labs: Done 03/01.  4. Medication instructions in preparation for your procedure:  Do not take your hydrochlorothiazide the morning of the cath  On the morning of your procedure, take your Aspirin and any morning medicines NOT listed above.  You may use sips of water.  5. Plan for one night stay--bring personal belongings. 6. Bring a current list of your medications and current insurance cards. 7. You MUST have a responsible person to drive you home. 8. Someone MUST be with you the first 24 hours  after you arrive home or your discharge will be delayed. 9. Please wear clothes that are easy to get on and off and wear slip-on shoes.  Thank you for allowing [place] to care for you!   -- [facility]  Follow-Up: Your physician recommends that you schedule a follow-up appointment in: 4 weeks  Any Other Special Instructions Will Be Listed Below (If Applicable).     If you need a refill on your cardiac medications before your next appointment, please call your pharmacy.   [place], RN, BSN    Coronary Angiogram With Stent Coronary angiogram with stent placement is a procedure to widen or open a narrow blood vessel of the heart (coronary artery). Arteries may become blocked by cholesterol buildup (plaques) in the lining of the wall. When a coronary artery becomes partially blocked, blood flow to that area decreases. This may lead to chest pain or a heart attack (myocardial infarction). A stent is a small piece of metal that looks like mesh or a spring. Stent placement may be done as treatment for a heart attack or right after a coronary angiogram in which a blocked artery is found. Let your health care provider know about:  Any allergies you have.  All medicines you are taking, including vitamins, herbs, eye drops, creams, and over-the-counter medicines.  Any problems you or family members have had with anesthetic medicines.  Any blood disorders you have.  Any surgeries you have had.  Any medical conditions you have.  Whether you are pregnant or may be pregnant. What are the risks? Generally, this  is a safe procedure. However, problems may occur, including:  Damage to the heart or its blood vessels.  A return of blockage.  Bleeding, infection, or bruising at the insertion site.  A collection of blood under the skin (hematoma) at the insertion site.  A blood clot in another part of the body.  Kidney injury.  Allergic  reaction to the dye or contrast that is used.  Bleeding into the abdomen (retroperitoneal bleeding).  What happens before the procedure? Staying hydrated Follow instructions from your health care provider about hydration, which may include:  Up to 2 hours before the procedure - you may continue to drink clear liquids, such as water, clear fruit juice, black coffee, and plain tea.  Eating and drinking restrictions Follow instructions from your health care provider about eating and drinking, which may include:  8 hours before the procedure - stop eating heavy meals or foods such as meat, fried foods, or fatty foods.  6 hours before the procedure - stop eating light meals or foods, such as toast or cereal.  2 hours before the procedure - stop drinking clear liquids.  Ask your health care provider about:  Changing or stopping your regular medicines. This is especially important if you are taking diabetes medicines or blood thinners.  Taking medicines such as ibuprofen. These medicines can thin your blood. Do not take these medicines before your procedure if your health care provider instructs you not to. Generally, aspirin is recommended before a procedure of passing a small, thin tube (catheter) through a blood vessel and into the heart (cardiac catheterization).  What happens during the procedure?  An IV tube will be inserted into one of your veins.  You will be given one or more of the following: ? A medicine to help you relax (sedative). ? A medicine to numb the area where the catheter will be inserted into an artery (local anesthetic).  To reduce your risk of infection: ? Your health care team will wash or sanitize their hands. ? Your skin will be washed with soap. ? Hair may be removed from the area where the catheter will be inserted.  Using a guide wire, the catheter will be inserted into an artery. The location may be in your groin, in your wrist, or in the fold of your arm  (near your elbow).  A type of X-ray (fluoroscopy) will be used to help guide the catheter to the opening of the arteries in the heart.  A dye will be injected into the catheter, and X-rays will be taken. The dye will help to show where any narrowing or blockages are located in the arteries.  A tiny wire will be guided to the blocked spot, and a balloon will be inflated to make the artery wider.  The stent will be expanded and will crush the plaques into the wall of the vessel. The stent will hold the area open and improve the blood flow. Most stents have a drug coating to reduce the risk of the stent narrowing over time.  The artery may be made wider using a drill, laser, or other tools to remove plaques.  When the blood flow is better, the catheter will be removed. The lining of the artery will grow over the stent, which stays where it was placed. This procedure may vary among health care providers and hospitals. What happens after the procedure?  If the procedure is done through the leg, you will be kept in bed lying flat for  about 6 hours. You will be instructed to not bend and not cross your legs.  The insertion site will be checked frequently.  The pulse in your foot or wrist will be checked frequently.  You may have additional blood tests, X-rays, and a test that records the electrical activity of your heart (electrocardiogram, or ECG). This information is not intended to replace advice given to you by your health care provider. Make sure you discuss any questions you have with your health care provider. Document Released: 10/07/2002 Document Revised: 12/01/2015 Document Reviewed: 11/06/2015 Elsevier Interactive Patient Education  [employer].

## 2017-06-15 LAB — CBC WITH DIFFERENTIAL/PLATELET
BASOS ABS: 0 10*3/uL (ref 0.0–0.2)
BASOS: 1 %
EOS (ABSOLUTE): 0.2 10*3/uL (ref 0.0–0.4)
EOS: 4 %
HEMATOCRIT: 48.9 % (ref 37.5–51.0)
HEMOGLOBIN: 16.7 g/dL (ref 13.0–17.7)
Immature Grans (Abs): 0 10*3/uL (ref 0.0–0.1)
Immature Granulocytes: 0 %
LYMPHS ABS: 1.6 10*3/uL (ref 0.7–3.1)
Lymphs: 34 %
MCH: 31.7 pg (ref 26.6–33.0)
MCHC: 34.2 g/dL (ref 31.5–35.7)
MCV: 93 fL (ref 79–97)
MONOCYTES: 9 %
Monocytes Absolute: 0.4 10*3/uL (ref 0.1–0.9)
NEUTROS ABS: 2.4 10*3/uL (ref 1.4–7.0)
Neutrophils: 52 %
Platelets: 174 10*3/uL (ref 150–379)
RBC: 5.27 x10E6/uL (ref 4.14–5.80)
RDW: 16.1 % — ABNORMAL HIGH (ref 12.3–15.4)
WBC: 4.7 10*3/uL (ref 3.4–10.8)

## 2017-06-15 LAB — BASIC METABOLIC PANEL
BUN / CREAT RATIO: 7 — AB (ref 10–24)
BUN: 10 mg/dL (ref 8–27)
CO2: 28 mmol/L (ref 20–29)
Calcium: 9.3 mg/dL (ref 8.6–10.2)
Chloride: 103 mmol/L (ref 96–106)
Creatinine, Ser: 1.42 mg/dL — ABNORMAL HIGH (ref 0.76–1.27)
GFR calc Af Amer: 59 mL/min/{1.73_m2} — ABNORMAL LOW (ref >59)
GFR calc non Af Amer: 51 mL/min/{1.73_m2} — ABNORMAL LOW (ref >59)
GLUCOSE: 102 mg/dL — AB (ref 65–99)
POTASSIUM: 4.6 mmol/L (ref 3.5–5.2)
SODIUM: 141 mmol/L (ref 134–144)

## 2017-06-15 LAB — PROTIME-INR
INR: 1 (ref 0.8–1.2)
Prothrombin Time: 10.6 s (ref 9.1–12.0)

## 2017-06-19 ENCOUNTER — Other Ambulatory Visit: Payer: Self-pay | Admitting: Cardiology

## 2017-06-19 LAB — HEPATIC FUNCTION PANEL
ALT: 40 IU/L (ref 0–44)
AST: 39 IU/L (ref 0–40)
Albumin: 4.4 g/dL (ref 3.6–4.8)
Alkaline Phosphatase: 93 IU/L (ref 39–117)
BILIRUBIN, DIRECT: 0.13 mg/dL (ref 0.00–0.40)
Bilirubin Total: 0.4 mg/dL (ref 0.0–1.2)
Total Protein: 6.4 g/dL (ref 6.0–8.5)

## 2017-06-20 ENCOUNTER — Telehealth: Payer: Self-pay | Admitting: *Deleted

## 2017-06-20 NOTE — Telephone Encounter (Signed)
LMTCB for pt to discuss instructions for cath June 21, 2017 10:30 AM.

## 2017-06-20 NOTE — Telephone Encounter (Signed)
Pt contacted pre-catheterization scheduled at [facility] for: Friday June 21, 2016 10:30 AM Verified arrival time and place: [place] Entrance A/[facility] at: 8 AM Nothing to eat or drink after midnight  Verified allergies in Epic.  Hold: HCTZ AM of cath. Sildenafil until post cath  Except hold medications  AM meds can be  taken pre-cath with sip of water including: ASA 81 mg Clopidogrel 75 mg   Confirmed patient has responsible person to drive home post procedure and observe patient for 24 hours: yes

## 2017-06-21 ENCOUNTER — Ambulatory Visit (HOSPITAL_COMMUNITY)
Admission: RE | Admit: 2017-06-21 | Discharge: 2017-06-21 | Disposition: A | Discharge status: Home / Self Care | Payer: Medicare Other | Source: Ambulatory Visit | Attending: Interventional Cardiology | Admitting: Interventional Cardiology

## 2017-06-21 ENCOUNTER — Encounter (HOSPITAL_COMMUNITY)
Admission: RE | Disposition: A | Discharge status: Home / Self Care | Payer: Self-pay | Source: Ambulatory Visit | Attending: Interventional Cardiology

## 2017-06-21 DIAGNOSIS — Z9861 Coronary angioplasty status: Secondary | ICD-10-CM

## 2017-06-21 DIAGNOSIS — Z7982 Long term (current) use of aspirin: Secondary | ICD-10-CM | POA: Insufficient documentation

## 2017-06-21 DIAGNOSIS — Y831 Surgical operation with implant of artificial internal device as the cause of abnormal reaction of the patient, or of later complication, without mention of misadventure at the time of the procedure: Secondary | ICD-10-CM | POA: Diagnosis not present

## 2017-06-21 DIAGNOSIS — Z885 Allergy status to narcotic agent status: Secondary | ICD-10-CM | POA: Diagnosis not present

## 2017-06-21 DIAGNOSIS — K219 Gastro-esophageal reflux disease without esophagitis: Secondary | ICD-10-CM | POA: Diagnosis not present

## 2017-06-21 DIAGNOSIS — I472 Ventricular tachycardia: Secondary | ICD-10-CM | POA: Insufficient documentation

## 2017-06-21 DIAGNOSIS — T82855A Stenosis of coronary artery stent, initial encounter: Secondary | ICD-10-CM | POA: Insufficient documentation

## 2017-06-21 DIAGNOSIS — Z7902 Long term (current) use of antithrombotics/antiplatelets: Secondary | ICD-10-CM | POA: Diagnosis not present

## 2017-06-21 DIAGNOSIS — I2510 Atherosclerotic heart disease of native coronary artery without angina pectoris: Secondary | ICD-10-CM

## 2017-06-21 DIAGNOSIS — G4733 Obstructive sleep apnea (adult) (pediatric): Secondary | ICD-10-CM | POA: Diagnosis not present

## 2017-06-21 DIAGNOSIS — I10 Essential (primary) hypertension: Secondary | ICD-10-CM | POA: Insufficient documentation

## 2017-06-21 DIAGNOSIS — E782 Mixed hyperlipidemia: Secondary | ICD-10-CM | POA: Insufficient documentation

## 2017-06-21 DIAGNOSIS — R9439 Abnormal result of other cardiovascular function study: Secondary | ICD-10-CM | POA: Diagnosis present

## 2017-06-21 HISTORY — PX: LEFT HEART CATH AND CORONARY ANGIOGRAPHY: CATH118249

## 2017-06-21 HISTORY — PX: CORONARY BALLOON ANGIOPLASTY: CATH118233

## 2017-06-21 HISTORY — DX: Coronary angioplasty status: Z98.61

## 2017-06-21 HISTORY — DX: Atherosclerotic heart disease of native coronary artery without angina pectoris: I25.10

## 2017-06-21 LAB — HEPATIC FUNCTION PANEL
ALBUMIN: 4.6 g/dL (ref 3.6–4.8)
ALT: 37 IU/L (ref 0–44)
AST: 39 IU/L (ref 0–40)
Alkaline Phosphatase: 85 IU/L (ref 39–117)
BILIRUBIN TOTAL: 0.4 mg/dL (ref 0.0–1.2)
Bilirubin, Direct: 0.15 mg/dL (ref 0.00–0.40)
TOTAL PROTEIN: 6.8 g/dL (ref 6.0–8.5)

## 2017-06-21 LAB — LIPID PANEL
CHOL/HDL RATIO: 3.7 ratio (ref 0.0–5.0)
CHOLESTEROL TOTAL: 133 mg/dL (ref 100–199)
HDL: 36 mg/dL — ABNORMAL LOW (ref >39)
LDL CALC: 78 mg/dL (ref 0–99)
Triglycerides: 93 mg/dL (ref 0–149)
VLDL CHOLESTEROL CAL: 19 mg/dL (ref 5–40)

## 2017-06-21 LAB — SPECIMEN STATUS REPORT

## 2017-06-21 LAB — POCT ACTIVATED CLOTTING TIME: ACTIVATED CLOTTING TIME: 384 s

## 2017-06-21 SURGERY — LEFT HEART CATH AND CORONARY ANGIOGRAPHY
Anesthesia: LOCAL

## 2017-06-21 MED ORDER — HEPARIN (PORCINE) IN NACL 2-0.9 UNIT/ML-% IJ SOLN
INTRAMUSCULAR | Status: AC
  Filled 2017-06-21: qty 1000

## 2017-06-21 MED ORDER — IOPAMIDOL (ISOVUE-370) INJECTION 76%
INTRAVENOUS | Status: DC | PRN
  Administered 2017-06-21: 85 mL via INTRA_ARTERIAL

## 2017-06-21 MED ORDER — SODIUM CHLORIDE 0.9 % WEIGHT BASED INFUSION: 1.0000 mL/kg/h | INTRAVENOUS | Status: DC

## 2017-06-21 MED ORDER — ACETAMINOPHEN 325 MG PO TABS
ORAL_TABLET | ORAL | Status: AC
  Administered 2017-06-21: 650 mg via ORAL
  Filled 2017-06-21: qty 2

## 2017-06-21 MED ORDER — LIDOCAINE HCL 1 % IJ SOLN
INTRAMUSCULAR | Status: AC
  Filled 2017-06-21: qty 20

## 2017-06-21 MED ORDER — FENTANYL CITRATE (PF) 100 MCG/2ML IJ SOLN
INTRAMUSCULAR | Status: DC | PRN
  Administered 2017-06-21: 25 ug via INTRAVENOUS

## 2017-06-21 MED ORDER — VERAPAMIL HCL 2.5 MG/ML IV SOLN
INTRAVENOUS | Status: AC
  Filled 2017-06-21: qty 2

## 2017-06-21 MED ORDER — CLOPIDOGREL BISULFATE 75 MG PO TABS: 75.0000 mg | ORAL_TABLET | Freq: Every day | ORAL | Status: DC

## 2017-06-21 MED ORDER — ONDANSETRON HCL 4 MG/2ML IJ SOLN: 4.0000 mg | Freq: Four times a day (QID) | INTRAMUSCULAR | Status: DC | PRN

## 2017-06-21 MED ORDER — SODIUM CHLORIDE 0.9 % IV SOLN: 250.0000 mL | INTRAVENOUS | Status: DC | PRN

## 2017-06-21 MED ORDER — ASPIRIN 81 MG PO CHEW: 81.0000 mg | CHEWABLE_TABLET | Freq: Once | ORAL | Status: DC

## 2017-06-21 MED ORDER — HEPARIN SODIUM (PORCINE) 1000 UNIT/ML IJ SOLN
INTRAMUSCULAR | Status: DC | PRN
  Administered 2017-06-21: 6000 [IU] via INTRAVENOUS
  Administered 2017-06-21: 5000 [IU] via INTRAVENOUS

## 2017-06-21 MED ORDER — SODIUM CHLORIDE 0.9% FLUSH: 3.0000 mL | INTRAVENOUS | Status: DC | PRN

## 2017-06-21 MED ORDER — SODIUM CHLORIDE 0.9 % WEIGHT BASED INFUSION
3.0000 mL/kg/h | INTRAVENOUS | Status: AC
  Administered 2017-06-21: 3 mL/kg/h via INTRAVENOUS

## 2017-06-21 MED ORDER — HYDRALAZINE HCL 20 MG/ML IJ SOLN
5.0000 mg | INTRAMUSCULAR | Status: AC | PRN
  Administered 2017-06-21: 5 mg via INTRAVENOUS

## 2017-06-21 MED ORDER — FENTANYL CITRATE (PF) 100 MCG/2ML IJ SOLN
INTRAMUSCULAR | Status: AC
  Filled 2017-06-21: qty 2

## 2017-06-21 MED ORDER — VERAPAMIL HCL 2.5 MG/ML IV SOLN
INTRAVENOUS | Status: DC | PRN
  Administered 2017-06-21: 10 mL via INTRA_ARTERIAL

## 2017-06-21 MED ORDER — MIDAZOLAM HCL 2 MG/2ML IJ SOLN
INTRAMUSCULAR | Status: DC | PRN
  Administered 2017-06-21: 2 mg via INTRAVENOUS

## 2017-06-21 MED ORDER — SODIUM CHLORIDE 0.9% FLUSH: 3.0000 mL | Freq: Two times a day (BID) | INTRAVENOUS | Status: DC

## 2017-06-21 MED ORDER — MIDAZOLAM HCL 2 MG/2ML IJ SOLN
INTRAMUSCULAR | Status: AC
  Filled 2017-06-21: qty 2

## 2017-06-21 MED ORDER — IOPAMIDOL (ISOVUE-370) INJECTION 76%
INTRAVENOUS | Status: AC
  Filled 2017-06-21: qty 100

## 2017-06-21 MED ORDER — SODIUM CHLORIDE 0.9 % IV SOLN: INTRAVENOUS | Status: AC

## 2017-06-21 MED ORDER — LIDOCAINE HCL (PF) 1 % IJ SOLN
INTRAMUSCULAR | Status: DC | PRN
  Administered 2017-06-21: 2 mL

## 2017-06-21 MED ORDER — IOPAMIDOL (ISOVUE-300) INJECTION 61%
INTRAVENOUS | Status: AC
  Filled 2017-06-21: qty 50

## 2017-06-21 MED ORDER — HEPARIN (PORCINE) IN NACL 2-0.9 UNIT/ML-% IJ SOLN
INTRAMUSCULAR | Status: AC | PRN
  Administered 2017-06-21 (×2): 500 mL

## 2017-06-21 MED ORDER — ASPIRIN 81 MG PO CHEW: 81.0000 mg | CHEWABLE_TABLET | Freq: Every day | ORAL | Status: DC

## 2017-06-21 MED ORDER — ACETAMINOPHEN 325 MG PO TABS
650.0000 mg | ORAL_TABLET | ORAL | Status: DC | PRN
  Administered 2017-06-21: 650 mg via ORAL

## 2017-06-21 MED ORDER — HEPARIN SODIUM (PORCINE) 1000 UNIT/ML IJ SOLN
INTRAMUSCULAR | Status: AC
  Filled 2017-06-21: qty 1

## 2017-06-21 MED ORDER — LABETALOL HCL 5 MG/ML IV SOLN
10.0000 mg | INTRAVENOUS | Status: AC | PRN
  Administered 2017-06-21: 10 mg via INTRAVENOUS

## 2017-06-21 MED ORDER — LABETALOL HCL 5 MG/ML IV SOLN
INTRAVENOUS | Status: AC
  Filled 2017-06-21: qty 4

## 2017-06-21 MED ORDER — HYDRALAZINE HCL 20 MG/ML IJ SOLN
INTRAMUSCULAR | Status: AC
  Filled 2017-06-21: qty 1

## 2017-06-21 SURGICAL SUPPLY — 15 items
BALLN WOLVERINE 3.00X15 (BALLOONS) ×2
BALLOON WOLVERINE 3.00X15 (BALLOONS) IMPLANT
CATH 5FR JL3.5 JR4 ANG PIG MP (CATHETERS) ×1 IMPLANT
CATH LAUNCHER 6FR JR4 (CATHETERS) ×1 IMPLANT
DEVICE RAD COMP TR BAND LRG (VASCULAR PRODUCTS) ×1 IMPLANT
GLIDESHEATH SLEND SS 6F .021 (SHEATH) ×1 IMPLANT
GUIDEWIRE INQWIRE 1.5J.035X260 (WIRE) IMPLANT
INQWIRE 1.5J .035X260CM (WIRE) ×2
KIT ENCORE 26 ADVANTAGE (KITS) ×1 IMPLANT
KIT HEART LEFT (KITS) ×2 IMPLANT
KIT HEMO VALVE WATCHDOG (MISCELLANEOUS) ×1 IMPLANT
PACK CARDIAC CATHETERIZATION (CUSTOM PROCEDURE TRAY) ×2 IMPLANT
TRANSDUCER W/STOPCOCK (MISCELLANEOUS) ×2 IMPLANT
TUBING CIL FLEX 10 FLL-RA (TUBING) ×2 IMPLANT
WIRE ASAHI PROWATER 180CM (WIRE) ×1 IMPLANT

## 2017-06-21 NOTE — Progress Notes (Addendum)
CTSP for episode of diaphoresis and seeing shadows. No other focal deficits. Pt was placed on trendelenburg. BP not checked at time of event but f/u BPs upper limits of normal to hypertensive. No chest pain. Nursing was concerned by ST segment on tele. EKG obtained without acute changes. VSS otherwise. He notes a mild headache but neuro exam normal. Breathing OK. O2 sat wnl. Small hematoma noted at right forearm. Dr. Irish Lack assessed pt and recommends continued monitoring for now; he feels he may still be a candidate for same day PCI but will need to readdress closer to time of DC. I am here until 4; pt's anticipated dc time was 1800 so I will be signing out to colleague if no other complications arise. Dayna Dunn PA-C   I have examined the patient and reviewed assessment and plan and discussed with patient.  Agree with above as stated.  Cranial nerve exam intact.  No focal weakness.  NO assymetry in strength.  COntinue to monitor.   Larae Grooms

## 2017-06-21 NOTE — Discharge Summary (Addendum)
Discharge Summary    Patient ID: Clarence Schneider,  MRN: [id number], DOB/AGE: [birth year] 67 y.o.  Admit date: 06/21/2017 Discharge date: 06/21/2017  Primary Care Provider: Algis Greenhouse Primary Cardiologist: Dr. Geraldo Pitter  Discharge Diagnoses    Active Problems:   CAD S/P percutaneous coronary angioplasty   Allergies Allergies  Allergen Reactions  . Oxycodone Itching  . Oxycontin [Oxycodone Hcl] Other (See Comments)    hallucinations  . Reglan [Metoclopramide] Other (See Comments)    Makes me act funny  . Tape Rash and Other (See Comments)    "paper tape"    Diagnostic Studies/Procedures    Procedures   CORONARY BALLOON ANGIOPLASTY  LEFT HEART CATH AND CORONARY ANGIOGRAPHY  Conclusion     Prox RCA to Dist RCA lesion is 10% stenosed. instent restenosis.  Dist RCA lesion is 75% stenosed.  Scoring balloon angioplasty was performed using a BALLOON WOLVERINE 3.00X15.  Post intervention, there is a 0% residual stenosis.  The left ventricular ejection fraction is 55-65% by visual estimate.  LV end diastolic pressure is normal.  The left ventricular systolic function is normal.  There is no aortic valve stenosis.   Successful cutting balloon angioplasty of a focal area of restenosis in the distal RCA.   Continue aggressive secondary prevention.      Diagnostic Diagram       Post-Intervention Diagram          History of Present Illness     67 y/o male, followed by Dr. Geraldo Pitter, with h/o CAD. 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 Recently, he complained of dizziness and outpatient monitor showed NSVT and transient bradycardia. NST was done  06/03/17 and was noted to be low risk . There was reversible small, mild apical perfusion defect, however a small area of ischemic could not be ruled out. EF was normal. However given his history and NSVT, Dr. Geraldo Pitter  recommended definitive LHC.   Hospital Course     Pt presented to [facility] on 06/21/16 for the planned procedure. Cath was performed by Dr. Irish Lack, via the right radial artery. He was found to have a 75% distal RCA that was treated with scoring balloon angioplasty. LVEF was normal at 55-65%. He tolerated the procedure well and left the cath lab in stable condition. ASA and Plavix continued. He was transported to short stay for post cath observation.     While in short stay, Dr. Beau Fanny was called to pt beside for episode of diaphoresis and seeing shadows. No other focal deficits. Pt was placed on trendelenburg. BP not checked at time of event but f/u BPs upper limits of normal to hypertensive. No chest pain. Nursing was concerned by ST segment on tele. EKG obtained without acute changes. VSS otherwise. He notes a mild headache but neuro exam normal. Breathing OK. O2 sat wnl. Small hematoma noted at right forearm. Pt denied CP. Dr. Irish Lack felt that the patient was still stable for same day discharge. Pt was reassess after period of bed rest and remained stable. Home meds were continued, ASA, Plavix, BB and statin. Pt has f/u with Dr. Geraldo Pitter in 7 days on 06/28/17.  Consultants: none    Discharge Vitals Blood pressure (!) 146/99, pulse 64, temperature 98 F (36.7 C), temperature source Oral, resp. rate 12, height 6' (1.829 m), weight 215 lb (97.5 kg), SpO2 100 %.  Filed Weights   06/21/17 0806  Weight: 215  lb (97.5 kg)    Labs & Radiologic Studies    CBC No results for input(s): WBC, NEUTROABS, HGB, HCT, MCV, PLT in the last 72 hours. Basic Metabolic Panel No results for input(s): NA, K, CL, CO2, GLUCOSE, BUN, CREATININE, CALCIUM, MG, PHOS in the last 72 hours. Liver Function Tests Recent Labs    06/19/17 1153 06/20/17 0000  AST 39 39  ALT 40 37  ALKPHOS 93 85  BILITOT 0.4 0.4  PROT 6.4 6.8  ALBUMIN 4.4 4.6   No results for input(s): LIPASE, AMYLASE in the last 72 hours. Cardiac  Enzymes No results for input(s): CKTOTAL, CKMB, CKMBINDEX, TROPONINI in the last 72 hours. BNP Invalid input(s): POCBNP D-Dimer No results for input(s): DDIMER in the last 72 hours. Hemoglobin A1C No results for input(s): HGBA1C in the last 72 hours. Fasting Lipid Panel Recent Labs    06/20/17 0000  CHOL 133  HDL 36*  LDLCALC 78  TRIG 93  CHOLHDL 3.7   Thyroid Function Tests No results for input(s): TSH, T4TOTAL, T3FREE, THYROIDAB in the last 72 hours.  Invalid input(s): FREET3 _____________  Dg Chest 2 View  Result Date: 06/14/2017 CLINICAL DATA:  Pt is having a heart cath in a week,nonsmoker,pre-op EXAM: CHEST  2 VIEW COMPARISON:  Chest x-ray dated 05/01/2013 FINDINGS: The heart size and mediastinal contours are within normal limits. Both lungs are clear. The visualized skeletal structures are unremarkable. IMPRESSION: No active cardiopulmonary disease. Electronically Signed   By: Franki Cabot M.D.   On: 06/14/2017 11:37   Disposition   Pt is being discharged home today in good condition.  Follow-up Plans & Appointments   Follow-up Information    Revankar, Reita Cliche, MD Follow up on 06/28/2017.   Specialty:  Cardiology Why:  11:20 AM  Contact information: [address] [place] [address] [phone]          Discharge Instructions    Amb Referral to Cardiac Rehabilitation   Complete by:  As directed    Referring to [place] progeam   Diagnosis:  PTCA      Discharge Medications    Aspirin prescribed at discharge?  Yes High Intensity Statin Prescribed? (Lipitor 40-80mg  or Crestor 20-40mg ): Yes Beta Blocker Prescribed? Yes For EF <40%, was ACEI/ARB Prescribed? Yes ADP Receptor Inhibitor Prescribed? (i.e. Plavix etc.-Includes Medically Managed Patients): Yes For EF <40%, Aldosterone Inhibitor Prescribed? No: EF > 40% Was EF assessed during THIS hospitalization? Yes Was Cardiac Rehab II ordered? (Included Medically managed Patients):  Yes   Outstanding Labs/Studies   none  Duration of Discharge Encounter   Greater than 30 minutes including physician time.  Signed, Lyda Jester PA-C 06/21/2017, 5:54 PM    I have examined the patient and reviewed assessment and plan and discussed with patient.  Agree with above as stated.  DOing well s/p cutting balloon angioplasty of the distal RCA.  COntinue DAPT for at least 1 month (short duration due to no stent being placed).  He can continue for longer as he has been doing.    Continue eval for bradycardia/dizziness.   Larae Grooms

## 2017-06-21 NOTE — Progress Notes (Addendum)
Pt arrived with small hematoma proximal to tr band , Kyle rt from cath l;ab held pressure 20 minutes. Pt c/o dark shadows in front of eyes, pressure on arrival was 161/104 and was treated with hydralazine 5 mg , pt placed in reverse Trendelenburg and Danna Dunn pa was paged, Marylyn Ishihara rt and myself noticed change in ekg on the monitor, Dr Irish Lack was called and repeat ekg was done, Dr Irish Lack arrived and reviewed. Pt c/o headache 7/10 that reduced to 2/10  10 minutes later. Pt was given tylenol. bp fluctuating,  @1311  coban placed proximal to tr band and will continue to monitor. Right Radial pulse weak hand warm. 1320 coban removed, site reduced, placed back loosely.  1333 removed coban, site bruised, hematoma reduced, continue to monitor.

## 2017-06-21 NOTE — Interval H&P Note (Signed)
Cath Lab Visit (complete for each Cath Lab visit)  Clinical Evaluation Leading to the Procedure:   ACS: No.  Non-ACS:    Anginal Classification: CCS II  Anti-ischemic medical therapy: Maximal Therapy (2 or more classes of medications)  Non-Invasive Test Results: Intermediate-risk stress test findings: cardiac mortality 1-3%/year  Prior CABG: No previous CABG      History and Physical Interval Note:  06/21/2017 10:59 AM  Clarence Schneider  has presented today for surgery, with the diagnosis of cad  The various methods of treatment have been discussed with the patient and family. After consideration of risks, benefits and other options for treatment, the patient has consented to  Procedure(s): LEFT HEART CATH AND CORONARY ANGIOGRAPHY (N/A) as a surgical intervention .  The patient's history has been reviewed, patient examined, no change in status, stable for surgery.  I have reviewed the patient's chart and labs.  Questions were answered to the patient's satisfaction.     Larae Grooms

## 2017-06-21 NOTE — Progress Notes (Signed)
[id number] Education with pt and wife who voiced understanding. Has been on plavix already. Reviewed NTG use, heart healthy food choices and ex ed. Has attended CRP 2 and would like to attend again. Will refer to [place] program. Graylon Good RN BSN 06/21/2017 3:08 PM

## 2017-06-21 NOTE — Discharge Instructions (Signed)

## 2017-06-22 LAB — HEPATIC FUNCTION PANEL

## 2017-06-24 ENCOUNTER — Encounter (HOSPITAL_COMMUNITY): Payer: Self-pay | Admitting: Interventional Cardiology

## 2017-06-24 ENCOUNTER — Other Ambulatory Visit: Payer: Self-pay

## 2017-06-24 MED ORDER — CLOPIDOGREL BISULFATE 75 MG PO TABS: 75.0000 mg | ORAL_TABLET | Freq: Every day | ORAL | 1 refills | Status: DC

## 2017-06-28 ENCOUNTER — Ambulatory Visit (INDEPENDENT_AMBULATORY_CARE_PROVIDER_SITE_OTHER): Payer: Medicare Other | Admitting: Cardiology

## 2017-06-28 ENCOUNTER — Encounter: Payer: Self-pay | Admitting: Cardiology

## 2017-06-28 VITALS — BP 130/80 | HR 64 | Ht 72.0 in | Wt 218.8 lb

## 2017-06-28 DIAGNOSIS — I10 Essential (primary) hypertension: Secondary | ICD-10-CM | POA: Diagnosis not present

## 2017-06-28 DIAGNOSIS — I472 Ventricular tachycardia: Secondary | ICD-10-CM

## 2017-06-28 DIAGNOSIS — E782 Mixed hyperlipidemia: Secondary | ICD-10-CM | POA: Diagnosis not present

## 2017-06-28 DIAGNOSIS — I739 Peripheral vascular disease, unspecified: Secondary | ICD-10-CM | POA: Diagnosis not present

## 2017-06-28 DIAGNOSIS — Z9861 Coronary angioplasty status: Secondary | ICD-10-CM | POA: Diagnosis not present

## 2017-06-28 DIAGNOSIS — I4729 Other ventricular tachycardia: Secondary | ICD-10-CM

## 2017-06-28 DIAGNOSIS — I2510 Atherosclerotic heart disease of native coronary artery without angina pectoris: Secondary | ICD-10-CM

## 2017-06-28 MED ORDER — AMOXICILLIN-POT CLAVULANATE 875-125 MG PO TABS: 1.0000 | ORAL_TABLET | Freq: Two times a day (BID) | ORAL | 0 refills | Status: DC

## 2017-06-28 NOTE — Patient Instructions (Signed)
Medication Instructions:  Your physician has recommended you make the following change in your medication:  START Augmentin 875 mg twice daily for 5 days  Labwork: Your physician recommends that you have the following labs drawn: BMP  Testing/Procedures: Non  Follow-Up: Your physician recommends that you schedule a follow-up appointment in: 6 months  Any Other Special Instructions Will Be Listed Below (If Applicable).   Referral to cardiac rehab has been made they should contact you  If you need a refill on your cardiac medications before your next appointment, please call your pharmacy.   [place], RN, BSN

## 2017-06-28 NOTE — Progress Notes (Signed)
Cardiology Office Note:    Date:  06/28/2017   ID:  Clarence Schneider, DOB 1950-07-20, MRN [id number]  PCP:  Algis Greenhouse, MD  Cardiologist:  Jenean Lindau, MD   Referring MD: Algis Greenhouse, MD    ASSESSMENT:    No diagnosis found. PLAN:    In order of problems listed above:  1. Secondary prevention stressed with the patient.  Importance of compliance with diet and medications to estimate vocalized understanding.  I have encouraged him to get in touch with the cardiac rehab program at [facility] and we will try to help him get access to that program. 2. In view of renal insufficiency may be get a Chem-7 today.  I advised him about taking medications which may be nephrotoxins or even heparin toxic in view of his elevated LFTs in the past. 3. Lipids were reviewed and LFTs were reviewed and I discussed this with him at length. 4. In view of his cellulitis-like picture which appears fairly mild I have initiated him on Augmentin 875 mg twice a day for 1 week.Patient will be seen in follow-up appointment in 6 months or earlier if the patient has any concerns    Medication Adjustments/Labs and Tests Ordered: Current medicines are reviewed at length with the patient today.  Concerns regarding medicines are outlined above.  No orders of the defined types were placed in this encounter.  No orders of the defined types were placed in this encounter.    Chief Complaint  Patient presents with  . Follow-up  . Coronary Artery Disease     History of Present Illness:    Clarence Schneider is a 67 y.o. male patient has past medical history of coronary disease.  He was evaluated by me for chest pain and arrhythmias.  He underwent coronary angiography and angioplasty of the RCA to the distal segment.  He denies any problems at this time no chest pain orthopnea or PND.  He is keen on joining the cardiac program return to our hospital.  There is mild insufficiency.  Also mentions to  me that he has some issues with his wrist at the cath access site.  Past Medical History:  Diagnosis Date  . Arthritis   . Coronary artery disease    stents  . Dizziness   . Esophageal reflux   . Headache(784.0)   . Hyperlipemia   . Hypertension   . Lightheadedness    doesn't occur everyday; happens at times; once occured while walking up a incline  . Neuromuscular disorder (HCC)    Takes Gabapentin  . Pinched nerve    in back  . Shortness of breath    with exertion  . Skin abrasion    under both arms; uses Lotrisone cream twice a day  . Sleep apnea    cpap doesnt use reg    Past Surgical History:  Procedure Laterality Date  . APPENDECTOMY    . CARDIAC CATHETERIZATION     X 3 stents  . CARPAL TUNNEL RELEASE Bilateral   . COLONOSCOPY W/ POLYPECTOMY    . CORONARY BALLOON ANGIOPLASTY N/A 06/21/2017   Procedure: CORONARY BALLOON ANGIOPLASTY;  Surgeon: Jettie Booze, MD;  Location: [facility];  Service: Cardiovascular;  Laterality: N/A;  . ELBOW ARTHROSCOPY Bilateral    ulner nerve   2 surgeries  . INGUINAL HERNIA REPAIR Right   . KNEE ARTHROSCOPY Bilateral    Left x 1 Right X 2  . LEFT HEART  CATH AND CORONARY ANGIOGRAPHY N/A 06/21/2017   Procedure: LEFT HEART CATH AND CORONARY ANGIOGRAPHY;  Surgeon: Jettie Booze, MD;  Location: [facility];  Service: Cardiovascular;  Laterality: N/A;  . SHOULDER ARTHROSCOPY Left   . TONSILLECTOMY    . TOTAL KNEE ARTHROPLASTY Left 05/11/2013   Procedure: LEFT TOTAL KNEE ARTHROPLASTY;  Surgeon: Vickey Huger, MD;  Location: [facility];  Service: Orthopedics;  Laterality: Left;  . TOTAL KNEE ARTHROPLASTY Right 11/30/2013   dr Ronnie Derby  . TOTAL KNEE ARTHROPLASTY Right 11/30/2013   Procedure: TOTAL KNEE ARTHROPLASTY;  Surgeon: Vickey Huger, MD;  Location: [facility];  Service: Orthopedics;  Laterality: Right;    Current Medications: Current Meds  Medication Sig  . aspirin 81 MG EC tablet Take 81 mg by mouth daily.   . carvedilol  (COREG) 6.25 MG tablet Take 3.125 mg by mouth 2 (two) times daily with a meal.   . clopidogrel (PLAVIX) 75 MG tablet Take 1 tablet (75 mg total) by mouth daily.  Marland Kitchen ezetimibe (ZETIA) 10 MG tablet Take 1 tablet (10 mg total) by mouth daily.  . fenofibrate 160 MG tablet Take 160 mg by mouth every evening.   . hydrochlorothiazide (HYDRODIURIL) 12.5 MG tablet Take 1 tablet (12.5 mg total) by mouth daily.  Marland Kitchen ibuprofen (ADVIL,MOTRIN) 200 MG tablet Take 400 mg by mouth every 6 (six) hours as needed for headache or moderate pain.  . Multiple Vitamins-Minerals (MULTIVITAMIN WITH MINERALS) tablet Take 1 tablet by mouth daily.  . naproxen sodium (ALEVE) 220 MG tablet Take 440 mg by mouth daily as needed (for pain or headache).  Marland Kitchen omega-3 acid ethyl esters (LOVAZA) 1 g capsule Take 2 capsules (2 g total) by mouth 2 (two) times daily.  Marland Kitchen pyridoxine (B-6) 200 MG tablet Take 200 mg by mouth daily.  . rosuvastatin (CRESTOR) 40 MG tablet Take 1 tablet (40 mg total) by mouth daily.  . sildenafil (REVATIO) 20 MG tablet Take 20-40 mg by mouth as needed (for ED).   Marland Kitchen telmisartan (MICARDIS) 80 MG tablet Take 80 mg by mouth daily.   Marland Kitchen testosterone cypionate (DEPOTESTOTERONE CYPIONATE) 200 MG/ML injection Inject 300 mg into the muscle every 14 (fourteen) days.   Bethann Humble Sulfate (EYE DROPS RELIEF OP) Place 1 drop into both eyes 2 (two) times daily.  . vitamin B-12 (CYANOCOBALAMIN) 1000 MCG tablet Take 1,000 mcg by mouth daily.     Allergies:   Oxycodone; Oxycontin [oxycodone hcl]; Reglan [metoclopramide]; and Tape   Social History   Socioeconomic History  . Marital status: Married    Spouse name: None  . Number of children: None  . Years of education: None  . Highest education level: None  Social Needs  . Financial resource strain: None  . Food insecurity - worry: None  . Food insecurity - inability: None  . Transportation needs - medical: None  . Transportation needs - non-medical: None    Occupational History  . None  Tobacco Use  . Smoking status: Never Smoker  . Smokeless tobacco: Never Used  Substance and Sexual Activity  . Alcohol use: No  . Drug use: No  . Sexual activity: None  Other Topics Concern  . None  Social History Narrative  . None     Family History: The patient's family history is not on file.  ROS:   Please see the history of present illness.    All other systems reviewed and are negative.  EKGs/Labs/Other Studies Reviewed:    The following  studies were reviewed today: I reviewed coronary angiography access findings with him at length.  And his questions were answered to satisfaction.   Recent Labs: 05/24/2017: TSH 3.210 06/14/2017: BUN 10; Creatinine, Ser 1.42; Hemoglobin 16.7; Platelets 174; Potassium 4.6; Sodium 141 06/20/2017: ALT 37  Recent Lipid Panel    Component Value Date/Time   CHOL 133 06/20/2017 0000   TRIG 93 06/20/2017 0000   HDL 36 (L) 06/20/2017 0000   CHOLHDL 3.7 06/20/2017 0000   LDLCALC 78 06/20/2017 0000    Physical Exam:    VS:  BP 130/80 (BP Location: Left Arm, Patient Position: Sitting, Cuff Size: Normal)   Pulse 64   Ht 6' (1.829 m)   Wt 218 lb 12.8 oz (99.2 kg)   SpO2 98%   BMI 29.67 kg/m     Wt Readings from Last 3 Encounters:  06/28/17 218 lb 12.8 oz (99.2 kg)  06/21/17 215 lb (97.5 kg)  06/14/17 220 lb (99.8 kg)     GEN: Patient is in no acute distress HEENT: Normal NECK: No JVD; No carotid bruits LYMPHATICS: No lymphadenopathy CARDIAC: Hear sounds regular, 2/6 systolic murmur at the apex. RESPIRATORY:  Clear to auscultation without rales, wheezing or rhonchi  ABDOMEN: Soft, non-tender, non-distended MUSCULOSKELETAL:  No edema; No deformity.  At the exit site of the right wrist the patient has evidence of ecchymosis.  There is tenderness and mild increase in warmth. SKIN: Warm and dry NEUROLOGIC:  Alert and oriented x 3 PSYCHIATRIC:  Normal affect   Signed, Jenean Lindau, MD  06/28/2017  9:05 AM    [place]

## 2017-06-29 LAB — BASIC METABOLIC PANEL
BUN/Creatinine Ratio: 12 (ref 10–24)
BUN: 19 mg/dL (ref 8–27)
CALCIUM: 9.7 mg/dL (ref 8.6–10.2)
CHLORIDE: 98 mmol/L (ref 96–106)
CO2: 28 mmol/L (ref 20–29)
CREATININE: 1.65 mg/dL — AB (ref 0.76–1.27)
GFR calc Af Amer: 50 mL/min/{1.73_m2} — ABNORMAL LOW (ref >59)
GFR calc non Af Amer: 43 mL/min/{1.73_m2} — ABNORMAL LOW (ref >59)
GLUCOSE: 101 mg/dL — AB (ref 65–99)
Potassium: 4.4 mmol/L (ref 3.5–5.2)
Sodium: 140 mmol/L (ref 134–144)

## 2017-07-04 ENCOUNTER — Other Ambulatory Visit: Payer: Self-pay

## 2017-07-04 MED ORDER — FENOFIBRATE 160 MG PO TABS: 160.0000 mg | ORAL_TABLET | Freq: Every evening | ORAL | 2 refills | Status: DC

## 2017-07-05 ENCOUNTER — Telehealth: Payer: Self-pay | Admitting: Cardiology

## 2017-07-05 DIAGNOSIS — S39012A Strain of muscle, fascia and tendon of lower back, initial encounter: Secondary | ICD-10-CM

## 2017-07-05 HISTORY — DX: Strain of muscle, fascia and tendon of lower back, initial encounter: S39.012A

## 2017-07-05 NOTE — Telephone Encounter (Signed)
Jamy said Medicare needs a procedure code for him to take cardiac rehab.

## 2017-07-05 NOTE — Telephone Encounter (Signed)
Codes given to the patient.

## 2017-07-12 ENCOUNTER — Encounter: Payer: Self-pay | Admitting: Cardiology

## 2017-07-12 ENCOUNTER — Ambulatory Visit: Payer: Medicare Other | Admitting: Cardiology

## 2017-07-12 VITALS — BP 136/92 | HR 61 | Ht 72.0 in | Wt 220.2 lb

## 2017-07-12 DIAGNOSIS — I472 Ventricular tachycardia: Secondary | ICD-10-CM

## 2017-07-12 DIAGNOSIS — I25118 Atherosclerotic heart disease of native coronary artery with other forms of angina pectoris: Secondary | ICD-10-CM | POA: Diagnosis not present

## 2017-07-12 DIAGNOSIS — E785 Hyperlipidemia, unspecified: Secondary | ICD-10-CM

## 2017-07-12 DIAGNOSIS — I4729 Other ventricular tachycardia: Secondary | ICD-10-CM

## 2017-07-12 DIAGNOSIS — I10 Essential (primary) hypertension: Secondary | ICD-10-CM | POA: Diagnosis not present

## 2017-07-12 NOTE — Progress Notes (Signed)
Electrophysiology Office Note   Date:  07/12/2017   ID:  Clarence Schneider, DOB 1950/06/06, MRN [id number]  PCP:  Algis Greenhouse, MD  Cardiologist:  Revankar Primary Electrophysiologist:  Tylon Kemmerling Meredith Leeds, MD    Chief Complaint  Patient presents with  . Advice Only     History of Present Illness: Clarence Schneider is a 67 y.o. male who is being seen today for the evaluation of NSVT at the request of [name]. Presenting today for electrophysiology evaluation.  He has a history of coronary artery disease status post recent RCA stent, hypertension, hyperlipidemia.  Prior to that, he complained of dizziness and was noted to have daytime bradycardia, transient and asymptomatic as well as nonsustained VT.  Today, he denies symptoms of palpitations, shortness of breath, orthopnea, PND, lower extremity edema, claudication,  presyncope, syncope, bleeding, or neurologic sequela. The patient is tolerating medications without difficulties.  He does continue to have episodes of chest pain.  The chest pain lasts for between 5-10 seconds.  It occurs both at rest and with exertion.  He is also been having episodes of dizziness.  The dizziness occurs mainly when he changes position.  He says that he sees shadows and sometimes has blackening of his vision.  Otherwise he is doing well and exercising with quite a bit of walking.   Past Medical History:  Diagnosis Date  . Arthritis   . Coronary artery disease    stents  . Dizziness   . Esophageal reflux   . Headache(784.0)   . Hyperlipemia   . Hypertension   . Lightheadedness    doesn't occur everyday; happens at times; once occured while walking up a incline  . Neuromuscular disorder (HCC)    Takes Gabapentin  . Pinched nerve    in back  . Shortness of breath    with exertion  . Skin abrasion    under both arms; uses Lotrisone cream twice a day  . Sleep apnea    cpap doesnt use reg   Past Surgical History:  Procedure  Laterality Date  . APPENDECTOMY    . CARDIAC CATHETERIZATION     X 3 stents  . CARPAL TUNNEL RELEASE Bilateral   . COLONOSCOPY W/ POLYPECTOMY    . CORONARY BALLOON ANGIOPLASTY N/A 06/21/2017   Procedure: CORONARY BALLOON ANGIOPLASTY;  Surgeon: Jettie Booze, MD;  Location: [facility];  Service: Cardiovascular;  Laterality: N/A;  . ELBOW ARTHROSCOPY Bilateral    ulner nerve   2 surgeries  . INGUINAL HERNIA REPAIR Right   . KNEE ARTHROSCOPY Bilateral    Left x 1 Right X 2  . LEFT HEART CATH AND CORONARY ANGIOGRAPHY N/A 06/21/2017   Procedure: LEFT HEART CATH AND CORONARY ANGIOGRAPHY;  Surgeon: Jettie Booze, MD;  Location: [facility];  Service: Cardiovascular;  Laterality: N/A;  . SHOULDER ARTHROSCOPY Left   . TONSILLECTOMY    . TOTAL KNEE ARTHROPLASTY Left 05/11/2013   Procedure: LEFT TOTAL KNEE ARTHROPLASTY;  Surgeon: Vickey Huger, MD;  Location: [facility];  Service: Orthopedics;  Laterality: Left;  . TOTAL KNEE ARTHROPLASTY Right 11/30/2013   dr Ronnie Derby  . TOTAL KNEE ARTHROPLASTY Right 11/30/2013   Procedure: TOTAL KNEE ARTHROPLASTY;  Surgeon: Vickey Huger, MD;  Location: [facility];  Service: Orthopedics;  Laterality: Right;     Current Outpatient Medications  Medication Sig Dispense Refill  . aspirin 81 MG EC tablet Take 81 mg by mouth daily.     Marland Kitchen  carvedilol (COREG) 6.25 MG tablet Take 3.125 mg by mouth 2 (two) times daily with a meal.     . clopidogrel (PLAVIX) 75 MG tablet Take 1 tablet (75 mg total) by mouth daily. 90 tablet 1  . ezetimibe (ZETIA) 10 MG tablet Take 1 tablet (10 mg total) by mouth daily. 90 tablet 1  . fenofibrate 160 MG tablet Take 1 tablet (160 mg total) by mouth every evening. 90 tablet 2  . hydrochlorothiazide (HYDRODIURIL) 12.5 MG tablet Take 1 tablet (12.5 mg total) by mouth daily. 90 tablet 1  . Multiple Vitamins-Minerals (MULTIVITAMIN WITH MINERALS) tablet Take 1 tablet by mouth daily.    Marland Kitchen omega-3 acid ethyl esters (LOVAZA) 1 g capsule  Take 2 capsules (2 g total) by mouth 2 (two) times daily. 360 capsule 3  . pyridoxine (B-6) 200 MG tablet Take 200 mg by mouth daily.    . rosuvastatin (CRESTOR) 40 MG tablet Take 1 tablet (40 mg total) by mouth daily. 90 tablet 1  . sildenafil (REVATIO) 20 MG tablet Take 20-40 mg by mouth as needed (for ED).     Marland Kitchen telmisartan (MICARDIS) 80 MG tablet Take 80 mg by mouth daily.     Marland Kitchen testosterone cypionate (DEPOTESTOTERONE CYPIONATE) 200 MG/ML injection Inject 300 mg into the muscle every 14 (fourteen) days.     Bethann Humble Sulfate (EYE DROPS RELIEF OP) Place 1 drop into both eyes 2 (two) times daily.    . vitamin B-12 (CYANOCOBALAMIN) 1000 MCG tablet Take 1,000 mcg by mouth daily.     No current facility-administered medications for this visit.     Allergies:   Oxycodone; Oxycontin [oxycodone hcl]; Reglan [metoclopramide]; and Tape   Social History:  The patient  reports that he has never smoked. He has never used smokeless tobacco. He reports that he does not drink alcohol or use drugs.   Family History:  The patient's family history includes COPD in his brother; Cancer in his brother and father; Diabetes in his father; Heart attack in his brother; Hyperlipidemia in his father; Hypertension in his father and mother; Prostate cancer in his brother.    ROS:  Please see the history of present illness.   Otherwise, review of systems is positive for chest pain, snoring, wheezing, dizziness, headaches.   All other systems are reviewed and negative.    PHYSICAL EXAM: VS:  BP (!) 136/92   Pulse 61   Ht 6' (1.829 m)   Wt 220 lb 3.2 oz (99.9 kg)   SpO2 97%   BMI 29.86 kg/m  , BMI Body mass index is 29.86 kg/m. GEN: Well nourished, well developed, in no acute distress  HEENT: normal  Neck: no JVD, carotid bruits, or masses Cardiac: RRR; no murmurs, rubs, or gallops,no edema  Respiratory:  clear to auscultation bilaterally, normal work of breathing GI: soft, nontender,  nondistended, + BS MS: no deformity or atrophy  Skin: warm and dry Neuro:  Strength and sensation are intact Psych: euthymic mood, full affect  EKG:  EKG is not ordered today. Personal review of the ekg ordered 06/21/17 shows SR, PRWP, LAE, nonspecific T wave changes, rate 61  Recent Labs: 05/24/2017: TSH 3.210 06/14/2017: Hemoglobin 16.7; Platelets 174 06/20/2017: ALT 37 06/28/2017: BUN 19; Creatinine, Ser 1.65; Potassium 4.4; Sodium 140    Lipid Panel     Component Value Date/Time   CHOL 133 06/20/2017 0000   TRIG 93 06/20/2017 0000   HDL 36 (L) 06/20/2017 0000   CHOLHDL 3.7 06/20/2017  0000   LDLCALC 78 06/20/2017 0000     Wt Readings from Last 3 Encounters:  07/12/17 220 lb 3.2 oz (99.9 kg)  06/28/17 218 lb 12.8 oz (99.2 kg)  06/21/17 215 lb (97.5 kg)      Other studies Reviewed: Additional studies/ records that were reviewed today include: Holter 06/12/17 - personally reviewed  Review of the above records today demonstrates:  Baseline rhythm: Sinus  Minimum heart rate: 36 BPM at 3:17 PM.  Average heart rate: 65 BPM.  Maximal heart rate 109 BPM.  Atrial arrhythmia: None significant.  Brief atrial runs  Ventricular arrhythmia: 655 PVCs, 5 runs of 3 beats or longer.  The longest was 5 beats nonsustained ventricular tachycardia  Conduction abnormality: None significant  Symptoms: None   Conclusion:  Abnormal Holter monitoring with nonsustained ventricular tachycardia as explained above.  Also significant bradycardia was noted which was asymptomatic.  LHC 06/21/17  Prox RCA to Dist RCA lesion is 10% stenosed. instent restenosis.  Dist RCA lesion is 75% stenosed.  Scoring balloon angioplasty was performed using a BALLOON WOLVERINE 3.00X15.  Post intervention, there is a 0% residual stenosis.  The left ventricular ejection fraction is 55-65% by visual estimate.  LV end diastolic pressure is normal.  The left ventricular systolic function is normal.  There  is no aortic valve stenosis.   Successful cutting balloon angioplasty of a focal area of restenosis in the distal RCA.   Continue aggressive secondary prevention.     ASSESSMENT AND PLAN:  1.  Coronary artery disease: Status post RCA stent 06/21/17.  Having much in the way of chest pain that I can attribute to cardiac.  Currently on aspirin and Plavix.  No changes.  2.  Nonsustained ventricular tachycardia: Had up to 5 beats multiple times while wearing his cardiac monitor.  He had a recent catheterization with a stent placed in the RCA.  Due to his stent being placed, no further evaluation at this time.  3.  Hypertension: Systolic well controlled but diastolic is elevated today.  Has been normal in the past.  No changes.  4.  Hyperlipidemia: Continue Crestor  Current medicines are reviewed at length with the patient today.   The patient does not have concerns regarding his medicines.  The following changes were made today:  none  Labs/ tests ordered today include:  Orders Placed This Encounter  Procedures  . ECHOCARDIOGRAM COMPLETE   Case discussed with primary cardiology   Disposition:   FU with Taavi Hoose pending TTE  Signed, Cristalle Rohm Meredith Leeds, MD  07/12/2017 9:36 AM     [facility] [address] [place] [address] [phone] (office) [phone] (fax)

## 2017-07-12 NOTE — Patient Instructions (Signed)
Medication Instructions:  Your physician recommends that you continue on your current medications as directed. Please refer to the Current Medication list given to you today.  Labwork: None ordered  Testing/Procedures: Your physician has requested that you have an echocardiogram. Echocardiography is a painless test that uses sound waves to create images of your heart. It provides your doctor with information about the size and shape of your heart and how well your heart's chambers and valves are working. This procedure takes approximately one hour. There are no restrictions for this procedure.  Follow-Up: Follow up to be determined based upon echocardiogram results  * If you need a refill on your cardiac medications before your next appointment, please call your pharmacy.   *Please note that any paperwork needing to be filled out by the provider will need to be addressed at the front desk prior to seeing the provider. Please note that any FMLA, disability or other documents regarding health condition is subject to a $25.00 charge that must be received prior to completion of paperwork in the form of a money order or check.  Thank you for choosing [facility]!!   Trinidad Curet, RN [phone]  Any Other Special Instructions Will Be Listed Below (If Applicable).

## 2017-07-16 ENCOUNTER — Encounter: Payer: Self-pay | Admitting: Cardiology

## 2017-07-16 ENCOUNTER — Ambulatory Visit: Payer: Medicare Other | Admitting: Cardiology

## 2017-07-16 VITALS — BP 136/72 | HR 62 | Ht 73.0 in | Wt 221.8 lb

## 2017-07-16 DIAGNOSIS — Z9861 Coronary angioplasty status: Secondary | ICD-10-CM

## 2017-07-16 DIAGNOSIS — G4733 Obstructive sleep apnea (adult) (pediatric): Secondary | ICD-10-CM

## 2017-07-16 DIAGNOSIS — R7303 Prediabetes: Secondary | ICD-10-CM | POA: Diagnosis not present

## 2017-07-16 DIAGNOSIS — I4729 Other ventricular tachycardia: Secondary | ICD-10-CM

## 2017-07-16 DIAGNOSIS — I2510 Atherosclerotic heart disease of native coronary artery without angina pectoris: Secondary | ICD-10-CM | POA: Diagnosis not present

## 2017-07-16 DIAGNOSIS — I472 Ventricular tachycardia: Secondary | ICD-10-CM | POA: Diagnosis not present

## 2017-07-16 DIAGNOSIS — I10 Essential (primary) hypertension: Secondary | ICD-10-CM | POA: Diagnosis not present

## 2017-07-16 DIAGNOSIS — E782 Mixed hyperlipidemia: Secondary | ICD-10-CM | POA: Diagnosis not present

## 2017-07-16 DIAGNOSIS — I739 Peripheral vascular disease, unspecified: Secondary | ICD-10-CM

## 2017-07-16 NOTE — Patient Instructions (Signed)
Medication Instructions:  Your physician recommends that you continue on your current medications as directed. Please refer to the Current Medication list given to you today.  Labwork: None  Testing/Procedures: Your physician has recommended that you wear an event monitor. Event monitors are medical devices that record the heart's electrical activity. Doctors most often us these monitors to diagnose arrhythmias. Arrhythmias are problems with the speed or rhythm of the heartbeat. The monitor is a small, portable device. You can wear one while you do your normal daily activities. This is usually used to diagnose what is causing palpitations/syncope (passing out).  Follow-Up: Your physician recommends that you schedule a follow-up appointment in: 6 months  Any Other Special Instructions Will Be Listed Below (If Applicable).     If you need a refill on your cardiac medications before your next appointment, please call your pharmacy.   [facility]  Ashley A, RN, BSN  

## 2017-07-16 NOTE — Progress Notes (Signed)
Cardiology Office Note:    Date:  07/16/2017   ID:  Clarence Schneider, DOB Dec 29, 1950, MRN [id number]  PCP:  Algis Greenhouse, MD  Cardiologist:  Jenean Lindau, MD   Referring MD: Algis Greenhouse, MD    ASSESSMENT:    1. Nonsustained ventricular tachycardia ([place])   2. CAD S/P percutaneous coronary angioplasty   3. Essential hypertension   4. Peripheral arterial disease ([place])   5. Obstructive sleep apnea   6. Mixed dyslipidemia   7. Pre-diabetes    PLAN:    In order of problems listed above:  1. Secondary prevention stressed with the patient.  Importance of compliance with diet and medications stressed and he vocalized understanding.  He walks on a regular basis.  His blood pressure stable.  Diet was discussed for dyslipidemia. 2. In view of his symptoms of dizziness and nonsustained ventricular tachycardia I discussed at length electrophysiology evaluation.  We will do a 30-day event monitor.  We will be seen in follow-up appointment in 6 months or earlier if he has any concerns.  He was put to the nearest emergency room for any significant problems.   Medication Adjustments/Labs and Tests Ordered: Current medicines are reviewed at length with the patient today.  Concerns regarding medicines are outlined above.  Orders Placed This Encounter  Procedures  . Cardiac event monitor   No orders of the defined types were placed in this encounter.    Chief Complaint  Patient presents with  . Follow-up  . Coronary Artery Disease     History of Present Illness:    Clarence Schneider is a 67 y.o. male the patient has known coronary artery disease.  The patient was evaluated by me for dizziness.  He had nonsustained ventricular tachycardia on the monitor and he underwent coronary angiography which was unremarkable.  This coronary angiography is unremarkable.  I referred the patient for electrophysiology evaluation and they recommended continued therapy.  Patient mentions to me  that after coronary angiography his dizziness feelings has disappeared but came back again.  He denies any chest pain orthopnea PND or any syncope or presyncopal-like episodes.  At the time of my evaluation, the patient is alert awake oriented and in no distress.  He has never passed out.  Past Medical History:  Diagnosis Date  . Arthritis   . Coronary artery disease    stents  . Dizziness   . Esophageal reflux   . Headache(784.0)   . Hyperlipemia   . Hypertension   . Lightheadedness    doesn't occur everyday; happens at times; once occured while walking up a incline  . Neuromuscular disorder (HCC)    Takes Gabapentin  . Pinched nerve    in back  . Shortness of breath    with exertion  . Skin abrasion    under both arms; uses Lotrisone cream twice a day  . Sleep apnea    cpap doesnt use reg    Past Surgical History:  Procedure Laterality Date  . APPENDECTOMY    . CARDIAC CATHETERIZATION     X 3 stents  . CARPAL TUNNEL RELEASE Bilateral   . COLONOSCOPY W/ POLYPECTOMY    . CORONARY BALLOON ANGIOPLASTY N/A 06/21/2017   Procedure: CORONARY BALLOON ANGIOPLASTY;  Surgeon: Jettie Booze, MD;  Location: [facility];  Service: Cardiovascular;  Laterality: N/A;  . ELBOW ARTHROSCOPY Bilateral    ulner nerve   2 surgeries  . INGUINAL HERNIA REPAIR Right   .  KNEE ARTHROSCOPY Bilateral    Left x 1 Right X 2  . LEFT HEART CATH AND CORONARY ANGIOGRAPHY N/A 06/21/2017   Procedure: LEFT HEART CATH AND CORONARY ANGIOGRAPHY;  Surgeon: Jettie Booze, MD;  Location: [facility];  Service: Cardiovascular;  Laterality: N/A;  . SHOULDER ARTHROSCOPY Left   . TONSILLECTOMY    . TOTAL KNEE ARTHROPLASTY Left 05/11/2013   Procedure: LEFT TOTAL KNEE ARTHROPLASTY;  Surgeon: Vickey Huger, MD;  Location: [facility];  Service: Orthopedics;  Laterality: Left;  . TOTAL KNEE ARTHROPLASTY Right 11/30/2013   dr Ronnie Derby  . TOTAL KNEE ARTHROPLASTY Right 11/30/2013   Procedure: TOTAL KNEE  ARTHROPLASTY;  Surgeon: Vickey Huger, MD;  Location: [facility];  Service: Orthopedics;  Laterality: Right;    Current Medications: Current Meds  Medication Sig  . aspirin 81 MG EC tablet Take 81 mg by mouth daily.   . carvedilol (COREG) 6.25 MG tablet Take 3.125 mg by mouth 2 (two) times daily with a meal.   . clopidogrel (PLAVIX) 75 MG tablet Take 1 tablet (75 mg total) by mouth daily.  Marland Kitchen ezetimibe (ZETIA) 10 MG tablet Take 1 tablet (10 mg total) by mouth daily.  . fenofibrate 160 MG tablet Take 1 tablet (160 mg total) by mouth every evening.  . hydrochlorothiazide (HYDRODIURIL) 12.5 MG tablet Take 1 tablet (12.5 mg total) by mouth daily.  . Multiple Vitamins-Minerals (MULTIVITAMIN WITH MINERALS) tablet Take 1 tablet by mouth daily.  Marland Kitchen omega-3 acid ethyl esters (LOVAZA) 1 g capsule Take 2 capsules (2 g total) by mouth 2 (two) times daily.  Marland Kitchen pyridoxine (B-6) 200 MG tablet Take 200 mg by mouth daily.  . rosuvastatin (CRESTOR) 40 MG tablet Take 1 tablet (40 mg total) by mouth daily.  . sildenafil (REVATIO) 20 MG tablet Take 20-40 mg by mouth as needed (for ED).   Marland Kitchen telmisartan (MICARDIS) 80 MG tablet Take 80 mg by mouth daily.   Marland Kitchen testosterone cypionate (DEPOTESTOTERONE CYPIONATE) 200 MG/ML injection Inject 300 mg into the muscle every 14 (fourteen) days.   Bethann Humble Sulfate (EYE DROPS RELIEF OP) Place 1 drop into both eyes 2 (two) times daily.  . traMADol (ULTRAM) 50 MG tablet   . vitamin B-12 (CYANOCOBALAMIN) 1000 MCG tablet Take 1,000 mcg by mouth daily.     Allergies:   Oxycontin [oxycodone hcl]; Reglan [metoclopramide]; and Tape   Social History   Socioeconomic History  . Marital status: Married    Spouse name: Not on file  . Number of children: Not on file  . Years of education: Not on file  . Highest education level: Not on file  Occupational History  . Not on file  Social Needs  . Financial resource strain: Not on file  . Food insecurity:    Worry: Not on file     Inability: Not on file  . Transportation needs:    Medical: Not on file    Non-medical: Not on file  Tobacco Use  . Smoking status: Never Smoker  . Smokeless tobacco: Never Used  Substance and Sexual Activity  . Alcohol use: No  . Drug use: No  . Sexual activity: Not on file  Lifestyle  . Physical activity:    Days per week: Not on file    Minutes per session: Not on file  . Stress: Not on file  Relationships  . Social connections:    Talks on phone: Not on file    Gets together: Not on file  Attends religious service: Not on file    Active member of club or organization: Not on file    Attends meetings of clubs or organizations: Not on file    Relationship status: Not on file  Other Topics Concern  . Not on file  Social History Narrative  . Not on file     Family History: The patient's family history includes COPD in his brother; Cancer in his brother and father; Diabetes in his father; Heart attack in his brother; Hyperlipidemia in his father; Hypertension in his father and mother; Prostate cancer in his brother.  ROS:   Please see the history of present illness.    All other systems reviewed and are negative.  EKGs/Labs/Other Studies Reviewed:    The following studies were reviewed today: I discussed my findings with the patient at extensive length.  On the angiography report was detailed.   Recent Labs: 05/24/2017: TSH 3.210 06/14/2017: Hemoglobin 16.7; Platelets 174 06/20/2017: ALT 37 06/28/2017: BUN 19; Creatinine, Ser 1.65; Potassium 4.4; Sodium 140  Recent Lipid Panel    Component Value Date/Time   CHOL 133 06/20/2017 0000   TRIG 93 06/20/2017 0000   HDL 36 (L) 06/20/2017 0000   CHOLHDL 3.7 06/20/2017 0000   LDLCALC 78 06/20/2017 0000    Physical Exam:    VS:  BP 136/72 (BP Location: Right Arm, Patient Position: Sitting, Cuff Size: Normal)   Pulse 62   Ht 6\' 1"  (1.854 m)   Wt 221 lb 12.8 oz (100.6 kg)   SpO2 99%   BMI 29.26 kg/m     Wt Readings  from Last 3 Encounters:  07/16/17 221 lb 12.8 oz (100.6 kg)  07/12/17 220 lb 3.2 oz (99.9 kg)  06/28/17 218 lb 12.8 oz (99.2 kg)     GEN: Patient is in no acute distress HEENT: Normal NECK: No JVD; No carotid bruits LYMPHATICS: No lymphadenopathy CARDIAC: Hear sounds regular, 2/6 systolic murmur at the apex. RESPIRATORY:  Clear to auscultation without rales, wheezing or rhonchi  ABDOMEN: Soft, non-tender, non-distended MUSCULOSKELETAL:  No edema; No deformity  SKIN: Warm and dry NEUROLOGIC:  Alert and oriented x 3 PSYCHIATRIC:  Normal affect   Signed, Jenean Lindau, MD  07/16/2017 11:00 AM    [place]

## 2017-07-17 NOTE — Research (Signed)
LATE ENTRY  CAD FEM Informed Consent                  Subject Name:  Clarence Schneider     Subject met inclusion and exclusion criteria.  The informed consent form, study requirements and expectations were reviewed with the subject and questions and concerns were addressed prior to the signing of the consent form.  The subject verbalized understanding of the trial requirements.  The subject agreed to participate in the CADFEM trial and signed the informed consent.  The informed consent was obtained prior to performance of any protocol-specific procedures for the subject.  A copy of the signed informed consent was given to the subject and a copy was placed in the subject's medical record. This patient was consented by Rico Sheehan on 06-21-17 at 9:23 a.m.             [place] Chalmers, Research Assistant 06/21/2017 10:00 a. m.

## 2017-07-19 ENCOUNTER — Ambulatory Visit (HOSPITAL_COMMUNITY): Payer: Medicare Other | Attending: Cardiovascular Disease

## 2017-07-19 ENCOUNTER — Ambulatory Visit: Payer: Medicare Other

## 2017-07-19 ENCOUNTER — Other Ambulatory Visit: Payer: Self-pay

## 2017-07-19 DIAGNOSIS — I119 Hypertensive heart disease without heart failure: Secondary | ICD-10-CM | POA: Diagnosis not present

## 2017-07-19 DIAGNOSIS — I4729 Other ventricular tachycardia: Secondary | ICD-10-CM

## 2017-07-19 DIAGNOSIS — E785 Hyperlipidemia, unspecified: Secondary | ICD-10-CM | POA: Diagnosis not present

## 2017-07-19 DIAGNOSIS — I25118 Atherosclerotic heart disease of native coronary artery with other forms of angina pectoris: Secondary | ICD-10-CM | POA: Insufficient documentation

## 2017-07-19 DIAGNOSIS — I472 Ventricular tachycardia: Secondary | ICD-10-CM | POA: Diagnosis not present

## 2017-07-26 ENCOUNTER — Telehealth: Payer: Self-pay | Admitting: *Deleted

## 2017-07-26 MED ORDER — CARVEDILOL 12.5 MG PO TABS: 12.5000 mg | ORAL_TABLET | Freq: Two times a day (BID) | ORAL | 3 refills | Status: DC

## 2017-07-26 NOTE — Telephone Encounter (Signed)
-----   Message from Will Meredith Leeds, MD sent at 07/24/2017  8:26 AM EDT ----- TTE without major abnormality. Increase coreg to 12.5 mg. Awaiting final 30 day monitor results.

## 2017-07-26 NOTE — Telephone Encounter (Signed)
Notes recorded by Stanton Kidney, RN on 07/26/2017 at 5:35 PM EDT Pt reports that he was wrong yesterday. He is taking Carvedilol 6.25 mg BID. Instructed pt to increase to 12.5 mg BID. Rx sent to pharmacy. Patient verbalized understanding and agreeable to plan.

## 2017-10-23 DIAGNOSIS — J321 Chronic frontal sinusitis: Secondary | ICD-10-CM

## 2017-10-23 HISTORY — DX: Chronic frontal sinusitis: J32.1

## 2017-11-04 ENCOUNTER — Telehealth: Payer: Self-pay

## 2017-11-04 MED ORDER — ROSUVASTATIN CALCIUM 40 MG PO TABS: 40.0000 mg | ORAL_TABLET | Freq: Every day | ORAL | 1 refills | Status: DC

## 2017-11-04 MED ORDER — EZETIMIBE 10 MG PO TABS: 10.0000 mg | ORAL_TABLET | Freq: Every day | ORAL | 1 refills | Status: DC

## 2017-11-04 NOTE — Telephone Encounter (Signed)
Rx for ezetimibe and rosuvastatin sent to [facility] as requested.

## 2017-11-22 ENCOUNTER — Ambulatory Visit: Payer: Medicare Other | Admitting: Cardiology

## 2017-11-22 ENCOUNTER — Encounter: Payer: Self-pay | Admitting: Cardiology

## 2017-11-22 VITALS — BP 116/68 | HR 55 | Ht 73.0 in | Wt 216.0 lb

## 2017-11-22 DIAGNOSIS — I10 Essential (primary) hypertension: Secondary | ICD-10-CM | POA: Diagnosis not present

## 2017-11-22 DIAGNOSIS — Z9861 Coronary angioplasty status: Secondary | ICD-10-CM

## 2017-11-22 DIAGNOSIS — I472 Ventricular tachycardia: Secondary | ICD-10-CM

## 2017-11-22 DIAGNOSIS — I739 Peripheral vascular disease, unspecified: Secondary | ICD-10-CM

## 2017-11-22 DIAGNOSIS — E782 Mixed hyperlipidemia: Secondary | ICD-10-CM

## 2017-11-22 DIAGNOSIS — I4729 Other ventricular tachycardia: Secondary | ICD-10-CM

## 2017-11-22 DIAGNOSIS — I2510 Atherosclerotic heart disease of native coronary artery without angina pectoris: Secondary | ICD-10-CM

## 2017-11-22 DIAGNOSIS — R0989 Other specified symptoms and signs involving the circulatory and respiratory systems: Secondary | ICD-10-CM | POA: Insufficient documentation

## 2017-11-22 MED ORDER — NITROGLYCERIN 0.4 MG SL SUBL: 0.4000 mg | SUBLINGUAL_TABLET | SUBLINGUAL | 11 refills | Status: DC | PRN

## 2017-11-22 NOTE — Progress Notes (Signed)
Cardiology Office Note:    Date:  11/22/2017   ID:  [name], DOB 1951/02/14, MRN [id number]  PCP:  Algis Greenhouse, MD  Cardiologist:  Jenean Lindau, MD   Referring MD: Algis Greenhouse, MD    ASSESSMENT:    1. CAD S/P percutaneous coronary angioplasty   2. Essential hypertension   3. Peripheral arterial disease ([place])   4. Nonsustained ventricular tachycardia ([place])   5. Mixed dyslipidemia   6. Bilateral carotid bruits    PLAN:    In order of problems listed above:  1. Secondary prevention stressed to patient.  Importance of compliance with diet and medications stressed and he vocalized understanding. 2. His blood pressure is stable.  Diet was discussed for dyslipidemia and extensive conversation was done about this.  He is going to be back in 2 months for liver lipid check.  I told him if his lipids remain elevated he might need injectable lipid-lowering therapy. 3. In view of dizziness and bilateral carotid bruit he will have Dopplers. 4. He has sleep apnea and they have changed his machine and is going to use a new machine beginning today.  I told him that if he does not feel better in a month in view of his dizziness he is to get an appointment with the neurologist and he understands that 5. Patient will be seen in follow-up appointment in 6 months or earlier if the patient has any concerns    Medication Adjustments/Labs and Tests Ordered: Current medicines are reviewed at length with the patient today.  Concerns regarding medicines are outlined above.  No orders of the defined types were placed in this encounter.  No orders of the defined types were placed in this encounter.    No chief complaint on file.    History of Present Illness:    Clarence Schneider is a 67 y.o. male.  The patient has known coronary artery disease.  He denies any problems at this time and takes care of activities of daily living.  No chest pain orthopnea or PND.  He complains of  some dizzy spells at times.  He has been extensively evaluated by me in the past.  He is event monitor for 1 month was unremarkable even when he had dizzy spells.  I also have cut down his blood pressure medications and he has had no change in his symptoms.  Past Medical History:  Diagnosis Date  . Arthritis   . Coronary artery disease    stents  . Dizziness   . Esophageal reflux   . Headache(784.0)   . Hyperlipemia   . Hypertension   . Lightheadedness    doesn't occur everyday; happens at times; once occured while walking up a incline  . Neuromuscular disorder ([facility])    Takes Gabapentin  . Pinched nerve    in back  . Shortness of breath    with exertion  . Skin abrasion    under both arms; uses Lotrisone cream twice a day  . Sleep apnea    cpap doesnt use reg    Past Surgical History:  Procedure Laterality Date  . APPENDECTOMY    . CARDIAC CATHETERIZATION     X 3 stents  . CARPAL TUNNEL RELEASE Bilateral   . COLONOSCOPY W/ POLYPECTOMY    . CORONARY BALLOON ANGIOPLASTY N/A 06/21/2017   Procedure: CORONARY BALLOON ANGIOPLASTY;  Surgeon: Jettie Booze, MD;  Location: [facility];  Service: Cardiovascular;  Laterality: N/A;  .  ELBOW ARTHROSCOPY Bilateral    ulner nerve   2 surgeries  . INGUINAL HERNIA REPAIR Right   . KNEE ARTHROSCOPY Bilateral    Left x 1 Right X 2  . LEFT HEART CATH AND CORONARY ANGIOGRAPHY N/A 06/21/2017   Procedure: LEFT HEART CATH AND CORONARY ANGIOGRAPHY;  Surgeon: Jettie Booze, MD;  Location: [facility];  Service: Cardiovascular;  Laterality: N/A;  . SHOULDER ARTHROSCOPY Left   . TONSILLECTOMY    . TOTAL KNEE ARTHROPLASTY Left 05/11/2013   Procedure: LEFT TOTAL KNEE ARTHROPLASTY;  Surgeon: Vickey Huger, MD;  Location: [facility];  Service: Orthopedics;  Laterality: Left;  . TOTAL KNEE ARTHROPLASTY Right 11/30/2013   dr Ronnie Derby  . TOTAL KNEE ARTHROPLASTY Right 11/30/2013   Procedure: TOTAL KNEE ARTHROPLASTY;  Surgeon: Vickey Huger,  MD;  Location: [facility];  Service: Orthopedics;  Laterality: Right;    Current Medications: Current Meds  Medication Sig  . aspirin 81 MG EC tablet Take 81 mg by mouth daily.   . clopidogrel (PLAVIX) 75 MG tablet Take 1 tablet (75 mg total) by mouth daily.  Marland Kitchen ezetimibe (ZETIA) 10 MG tablet Take 1 tablet (10 mg total) by mouth daily.  . fenofibrate 160 MG tablet Take 1 tablet (160 mg total) by mouth every evening.  . hydrochlorothiazide (HYDRODIURIL) 12.5 MG tablet Take 1 tablet (12.5 mg total) by mouth daily.  . Multiple Vitamins-Minerals (MULTIVITAMIN WITH MINERALS) tablet Take 1 tablet by mouth daily.  Marland Kitchen omega-3 acid ethyl esters (LOVAZA) 1 g capsule Take 2 capsules (2 g total) by mouth 2 (two) times daily.  Marland Kitchen pyridoxine (B-6) 200 MG tablet Take 200 mg by mouth daily.  . rosuvastatin (CRESTOR) 40 MG tablet Take 1 tablet (40 mg total) by mouth daily.  . sildenafil (REVATIO) 20 MG tablet Take 20-40 mg by mouth as needed (for ED).   Marland Kitchen telmisartan (MICARDIS) 80 MG tablet Take 80 mg by mouth daily.   Marland Kitchen testosterone cypionate (DEPOTESTOTERONE CYPIONATE) 200 MG/ML injection Inject 300 mg into the muscle every 14 (fourteen) days.   Bethann Humble Sulfate (EYE DROPS RELIEF OP) Place 1 drop into both eyes 2 (two) times daily.  . traMADol (ULTRAM) 50 MG tablet Take 50 mg by mouth every 6 (six) hours as needed.   . vitamin B-12 (CYANOCOBALAMIN) 1000 MCG tablet Take 1,000 mcg by mouth daily.     Allergies:   Oxycontin [oxycodone hcl]; Reglan [metoclopramide]; and Tape   Social History   Socioeconomic History  . Marital status: Married    Spouse name: Not on file  . Number of children: Not on file  . Years of education: Not on file  . Highest education level: Not on file  Occupational History  . Not on file  Social Needs  . Financial resource strain: Not on file  . Food insecurity:    Worry: Not on file    Inability: Not on file  . Transportation needs:    Medical: Not on file     Non-medical: Not on file  Tobacco Use  . Smoking status: Never Smoker  . Smokeless tobacco: Never Used  Substance and Sexual Activity  . Alcohol use: No  . Drug use: No  . Sexual activity: Not on file  Lifestyle  . Physical activity:    Days per week: Not on file    Minutes per session: Not on file  . Stress: Not on file  Relationships  . Social connections:    Talks on phone: Not  on file    Gets together: Not on file    Attends religious service: Not on file    Active member of club or organization: Not on file    Attends meetings of clubs or organizations: Not on file    Relationship status: Not on file  Other Topics Concern  . Not on file  Social History Narrative  . Not on file     Family History: The patient's family history includes COPD in his brother; Cancer in his brother and father; Diabetes in his father; Heart attack in his brother; Hyperlipidemia in his father; Hypertension in his father and mother; Prostate cancer in his brother.  ROS:   Please see the history of present illness.    All other systems reviewed and are negative.  EKGs/Labs/Other Studies Reviewed:    The following studies were reviewed today: I discussed my findings with the patient at length.  His lipids are markedly elevated.   Recent Labs: 05/24/2017: TSH 3.210 06/14/2017: Hemoglobin 16.7; Platelets 174 06/20/2017: ALT 37 06/28/2017: BUN 19; Creatinine, Ser 1.65; Potassium 4.4; Sodium 140  Recent Lipid Panel    Component Value Date/Time   CHOL 133 06/20/2017 0000   TRIG 93 06/20/2017 0000   HDL 36 (L) 06/20/2017 0000   CHOLHDL 3.7 06/20/2017 0000   LDLCALC 78 06/20/2017 0000    Physical Exam:    VS:  BP 116/68 (BP Location: Right Arm, Patient Position: Sitting, Cuff Size: Normal)   Pulse (!) 55   Ht 6\' 1"  (1.854 m)   Wt 216 lb (98 kg)   SpO2 98%   BMI 28.50 kg/m     Wt Readings from Last 3 Encounters:  11/22/17 216 lb (98 kg)  07/16/17 221 lb 12.8 oz (100.6 kg)  07/12/17 220  lb 3.2 oz (99.9 kg)     GEN: Patient is in no acute distress HEENT: Normal NECK: No JVD; bilateral soft carotid bruits LYMPHATICS: No lymphadenopathy CARDIAC: Hear sounds regular, 2/6 systolic murmur at the apex. RESPIRATORY:  Clear to auscultation without rales, wheezing or rhonchi  ABDOMEN: Soft, non-tender, non-distended MUSCULOSKELETAL:  No edema; No deformity  SKIN: Warm and dry NEUROLOGIC:  Alert and oriented x 3 PSYCHIATRIC:  Normal affect   Signed, Jenean Lindau, MD  11/22/2017 9:07 AM    [place]

## 2017-11-22 NOTE — Patient Instructions (Addendum)
Medication Instructions:  Your physician has recommended you make the following change in your medication:  START Nitroglycerin 0.4 mg sublingual (under your tongue) as needed for chest pain. If experiencing chest pain, stop what you are doing and sit down. Take 1 nitroglycerin and wait 5 minutes. If chest pain continues, take another nitroglycerin and wait 5 minutes. If chest pain does not subside, take 1 more nitroglycerin and dial 911. You make take a total of 3 nitroglycerin in a 15 minute time frame. This medication should not be used with 48 hours of viagra.  Labwork: Your physician recommends that you have the following labs drawn: BMP, TSH, liver and lipid panel in 2 months.  Testing/Procedures: Your physician has requested that you have a carotid duplex. This test is an ultrasound of the carotid arteries in your neck. It looks at blood flow through these arteries that supply the brain with blood. Allow one hour for this exam. There are no restrictions or special instructions.  Follow-Up: Your physician recommends that you schedule a follow-up appointment in: 6 months  Any Other Special Instructions Will Be Listed Below (If Applicable).     If you need a refill on your cardiac medications before your next appointment, please call your pharmacy.   [place], RN, BSN  Nitroglycerin sublingual tablets What is this medicine? NITROGLYCERIN (nye troe GLI ser in) is a type of vasodilator. It relaxes blood vessels, increasing the blood and oxygen supply to your heart. This medicine is used to relieve chest pain caused by angina. It is also used to prevent chest pain before activities like climbing stairs, going outdoors in cold weather, or sexual activity. This medicine may be used for other purposes; ask your health care provider or pharmacist if you have questions. COMMON BRAND NAME(S): Nitroquick, Nitrostat, Nitrotab What should I tell my health care provider before I  take this medicine? They need to know if you have any of these conditions: -anemia -head injury, recent stroke, or bleeding in the brain -liver disease -previous heart attack -an unusual or allergic reaction to nitroglycerin, other medicines, foods, dyes, or preservatives -pregnant or trying to get pregnant -breast-feeding How should I use this medicine? Take this medicine by mouth as needed. At the first sign of an angina attack (chest pain or tightness) place one tablet under your tongue. You can also take this medicine 5 to 10 minutes before an event likely to produce chest pain. Follow the directions on the prescription label. Let the tablet dissolve under the tongue. Do not swallow whole. Replace the dose if you accidentally swallow it. It will help if your mouth is not dry. Saliva around the tablet will help it to dissolve more quickly. Do not eat or drink, smoke or chew tobacco while a tablet is dissolving. If you are not better within 5 minutes after taking ONE dose of nitroglycerin, call 9-1-1 immediately to seek emergency medical care. Do not take more than 3 nitroglycerin tablets over 15 minutes. If you take this medicine often to relieve symptoms of angina, your doctor or health care professional may provide you with different instructions to manage your symptoms. If symptoms do not go away after following these instructions, it is important to call 9-1-1 immediately. Do not take more than 3 nitroglycerin tablets over 15 minutes. Talk to your pediatrician regarding the use of this medicine in children. Special care may be needed. Overdosage: If you think you have taken too much of this medicine  contact a poison control center or emergency room at once. NOTE: This medicine is only for you. Do not share this medicine with others. What if I miss a dose? This does not apply. This medicine is only used as needed. What may interact with this medicine? Do not take this medicine with any of the  following medications: -certain migraine medicines like ergotamine and dihydroergotamine (DHE) -medicines used to treat erectile dysfunction like sildenafil, tadalafil, and vardenafil -riociguat This medicine may also interact with the following medications: -alteplase -aspirin -heparin -medicines for high blood pressure -medicines for mental depression -other medicines used to treat angina -phenothiazines like chlorpromazine, mesoridazine, prochlorperazine, thioridazine This list may not describe all possible interactions. Give your health care provider a list of all the medicines, herbs, non-prescription drugs, or dietary supplements you use. Also tell them if you smoke, drink alcohol, or use illegal drugs. Some items may interact with your medicine. What should I watch for while using this medicine? Tell your doctor or health care professional if you feel your medicine is no longer working. Keep this medicine with you at all times. Sit or lie down when you take your medicine to prevent falling if you feel dizzy or faint after using it. Try to remain calm. This will help you to feel better faster. If you feel dizzy, take several deep breaths and lie down with your feet propped up, or bend forward with your head resting between your knees. You may get drowsy or dizzy. Do not drive, use machinery, or do anything that needs mental alertness until you know how this drug affects you. Do not stand or sit up quickly, especially if you are an older patient. This reduces the risk of dizzy or fainting spells. Alcohol can make you more drowsy and dizzy. Avoid alcoholic drinks. Do not treat yourself for coughs, colds, or pain while you are taking this medicine without asking your doctor or health care professional for advice. Some ingredients may increase your blood pressure. What side effects may I notice from receiving this medicine? Side effects that you should report to your doctor or health care  professional as soon as possible: -blurred vision -dry mouth -skin rash -sweating -the feeling of extreme pressure in the head -unusually weak or tired Side effects that usually do not require medical attention (report to your doctor or health care professional if they continue or are bothersome): -flushing of the face or neck -headache -irregular heartbeat, palpitations -nausea, vomiting This list may not describe all possible side effects. Call your doctor for medical advice about side effects. You may report side effects to FDA at 1-800-FDA-1088. Where should I keep my medicine? Keep out of the reach of children. Store at room temperature between 20 and 25 degrees C (68 and 77 degrees F). Store in [facility] [employer]. Protect from light and moisture. Keep tightly closed. Throw away any unused medicine after the expiration date. NOTE: This sheet is a summary. It may not cover all possible information. If you have questions about this medicine, talk to your doctor, pharmacist, or health care provider.  2018 Elsevier/Gold Standard (2013-01-29 17:57:36)

## 2017-11-26 NOTE — Telephone Encounter (Signed)
error 

## 2017-12-03 ENCOUNTER — Other Ambulatory Visit: Payer: Self-pay | Admitting: *Deleted

## 2017-12-03 MED ORDER — HYDROCHLOROTHIAZIDE 12.5 MG PO TABS: 12.5000 mg | ORAL_TABLET | Freq: Every day | ORAL | 1 refills | Status: DC

## 2017-12-04 ENCOUNTER — Other Ambulatory Visit: Payer: Self-pay

## 2017-12-04 MED ORDER — HYDROCHLOROTHIAZIDE 12.5 MG PO TABS: 12.5000 mg | ORAL_TABLET | Freq: Every day | ORAL | 2 refills | Status: DC

## 2017-12-20 ENCOUNTER — Other Ambulatory Visit: Payer: Self-pay | Admitting: *Deleted

## 2017-12-20 MED ORDER — TELMISARTAN 80 MG PO TABS: 80.0000 mg | ORAL_TABLET | Freq: Every day | ORAL | 1 refills | Status: DC

## 2017-12-23 ENCOUNTER — Other Ambulatory Visit: Payer: Self-pay

## 2017-12-23 ENCOUNTER — Telehealth: Payer: Self-pay | Admitting: Cardiology

## 2017-12-23 MED ORDER — CLOPIDOGREL BISULFATE 75 MG PO TABS: 75.0000 mg | ORAL_TABLET | Freq: Every day | ORAL | 2 refills | Status: DC

## 2017-12-23 NOTE — Telephone Encounter (Signed)
Med refill has been sent. 

## 2017-12-23 NOTE — Telephone Encounter (Signed)
° ° °  1. Which medications need to be refilled? (please list name of each medication and dose if known) clopidogrel 75mg   2. Which pharmacy/location (including street and city if local pharmacy) is medication to be sent to? [facility] [place]  3. Do they need a 30 day or 90 day supply? 90 day

## 2018-01-07 ENCOUNTER — Other Ambulatory Visit: Payer: Self-pay | Admitting: Emergency Medicine

## 2018-01-07 DIAGNOSIS — Z8719 Personal history of other diseases of the digestive system: Secondary | ICD-10-CM

## 2018-01-07 DIAGNOSIS — I2510 Atherosclerotic heart disease of native coronary artery without angina pectoris: Secondary | ICD-10-CM

## 2018-01-07 HISTORY — DX: Personal history of other diseases of the digestive system: Z87.19

## 2018-01-07 MED ORDER — OMEGA-3-ACID ETHYL ESTERS 1 G PO CAPS: 2.0000 g | ORAL_CAPSULE | Freq: Two times a day (BID) | ORAL | 3 refills | Status: DC

## 2018-01-07 NOTE — Telephone Encounter (Signed)
Lovaza 1 gm refilled.

## 2018-01-09 ENCOUNTER — Ambulatory Visit (INDEPENDENT_AMBULATORY_CARE_PROVIDER_SITE_OTHER): Payer: Medicare Other

## 2018-01-09 DIAGNOSIS — R0989 Other specified symptoms and signs involving the circulatory and respiratory systems: Secondary | ICD-10-CM

## 2018-01-09 DIAGNOSIS — I2510 Atherosclerotic heart disease of native coronary artery without angina pectoris: Secondary | ICD-10-CM | POA: Diagnosis not present

## 2018-01-09 DIAGNOSIS — Z9861 Coronary angioplasty status: Secondary | ICD-10-CM | POA: Diagnosis not present

## 2018-01-09 NOTE — Progress Notes (Addendum)
Complete carotid duplex exam has been performed. Normal exam.  Jimmy Matthieu Loftus, RDCS, RVT   

## 2018-01-15 ENCOUNTER — Telehealth: Payer: Self-pay | Admitting: Cardiology

## 2018-01-15 NOTE — Telephone Encounter (Signed)
Please call patient regarding carotid ultrasound he had last week.

## 2018-01-15 NOTE — Telephone Encounter (Signed)
Informed of results via voicemail; instructed to call the office with any questions or concerns.

## 2018-03-19 ENCOUNTER — Other Ambulatory Visit: Payer: Self-pay

## 2018-03-19 MED ORDER — FENOFIBRATE 160 MG PO TABS: 160.0000 mg | ORAL_TABLET | Freq: Every evening | ORAL | 1 refills | Status: DC

## 2018-05-01 ENCOUNTER — Other Ambulatory Visit: Payer: Self-pay

## 2018-05-01 DIAGNOSIS — E782 Mixed hyperlipidemia: Secondary | ICD-10-CM

## 2018-05-01 MED ORDER — EZETIMIBE 10 MG PO TABS: 10.0000 mg | ORAL_TABLET | Freq: Every day | ORAL | 2 refills | Status: DC

## 2018-05-01 MED ORDER — ROSUVASTATIN CALCIUM 40 MG PO TABS
40.0000 mg | ORAL_TABLET | Freq: Every day | ORAL | 2 refills | Status: DC
Start: 2018-05-01 — End: 2019-01-29

## 2018-06-04 ENCOUNTER — Encounter: Payer: Self-pay | Admitting: Cardiology

## 2018-06-04 ENCOUNTER — Ambulatory Visit (INDEPENDENT_AMBULATORY_CARE_PROVIDER_SITE_OTHER): Payer: Medicare Other | Admitting: Cardiology

## 2018-06-04 VITALS — BP 122/68 | HR 52 | Ht 73.0 in | Wt 222.0 lb

## 2018-06-04 DIAGNOSIS — R42 Dizziness and giddiness: Secondary | ICD-10-CM

## 2018-06-04 DIAGNOSIS — I472 Ventricular tachycardia: Secondary | ICD-10-CM

## 2018-06-04 DIAGNOSIS — R9439 Abnormal result of other cardiovascular function study: Secondary | ICD-10-CM

## 2018-06-04 DIAGNOSIS — I25118 Atherosclerotic heart disease of native coronary artery with other forms of angina pectoris: Secondary | ICD-10-CM | POA: Diagnosis not present

## 2018-06-04 DIAGNOSIS — E782 Mixed hyperlipidemia: Secondary | ICD-10-CM | POA: Diagnosis not present

## 2018-06-04 DIAGNOSIS — I10 Essential (primary) hypertension: Secondary | ICD-10-CM

## 2018-06-04 DIAGNOSIS — E785 Hyperlipidemia, unspecified: Secondary | ICD-10-CM | POA: Diagnosis not present

## 2018-06-04 DIAGNOSIS — I4729 Other ventricular tachycardia: Secondary | ICD-10-CM

## 2018-06-04 DIAGNOSIS — I2510 Atherosclerotic heart disease of native coronary artery without angina pectoris: Secondary | ICD-10-CM

## 2018-06-04 DIAGNOSIS — I739 Peripheral vascular disease, unspecified: Secondary | ICD-10-CM

## 2018-06-04 DIAGNOSIS — Z9861 Coronary angioplasty status: Secondary | ICD-10-CM

## 2018-06-04 NOTE — Patient Instructions (Addendum)
Medication Instructions:   Your physician recommends that you continue on your current medications as directed. Please refer to the Current Medication list given to you today.  If you need a refill on your cardiac medications before your next appointment, please call your pharmacy.   Lab work:  NONE  Testing/Procedures:  Your physician has requested that you have a stress echocardiogram. For further information please visit [email or web address]. Please follow instruction sheet as given.    Follow-Up: At [facility], you and your health needs are our priority.  As part of our continuing mission to provide you with exceptional heart care, we have created designated Provider Care Teams.  These Care Teams include your primary Cardiologist (physician) and Advanced Practice Providers (APPs -  Physician Assistants and Nurse Practitioners) who all work together to provide you with the care you need, when you need it. . You will need a follow up appointment in 6 months.  Please call our office 2 months in advance to schedule this appointment.    Any Other Special Instructions Will Be Listed Below (If Applicable).   Exercise Stress Echocardiogram  An exercise stress echocardiogram is a test that checks how well your heart is working. For this test, you will walk on a treadmill to make your heart beat faster. This test uses sound waves (ultrasound) and a computer to make pictures (images) of your heart. These pictures will be taken before you exercise and after you exercise. What happens before the procedure?  Follow instructions from your doctor about what you cannot eat or drink before the test.  Do not drink or eat anything that has caffeine in it. Stop having caffeine for 24 hours before the test.  Ask your doctor about changing or stopping your normal medicines. This is important if you take diabetes medicines or blood thinners. Ask your doctor if you should take your medicines with water  before the test.  If you use an inhaler, bring it to the test.  Do not use any products that have nicotine or tobacco in them, such as cigarettes and e-cigarettes. Stop using them for 4 hours before the test. If you need help quitting, ask your doctor.  Wear comfortable shoes and clothing. What happens during the procedure?  You will be hooked up to a TV screen. Your doctor will watch the screen to see how fast your heart beats during the test.  Before you exercise, a computer will make a picture of your heart. To do this: ? A gel will be put on your chest. ? A wand will be moved over the gel. ? Sound waves from the wand will go to the computer to make the picture.  Your will start walking on a treadmill. The treadmill will start at a slow speed. It will get faster a little bit at a time. When you walk faster, your heart will beat faster.  The treadmill will be stopped when your heart is working hard.  You will lie down right away so another picture of your heart can be taken.  The test will take 30-60 minutes. What happens after the procedure?  Your heart rate and blood pressure will be watched after the test.  If your doctor says that you can, you may: ? Eat what you usually eat. ? Do your normal activities. ? Take medicines like normal. Summary  An exercise stress echocardiogram is a test that checks how well your heart is working.  Follow instructions about what you cannot eat   or drink before the test. Ask your doctor if you should take your normal medicines before the test.  Stop having caffeine for 24 hours before the test. Do not use anything with nicotine or tobacco in it for 4 hours before the test.  A computer will take a picture of your heart before you walk on a treadmill. It will take another picture when you are done walking.  Your heart rate and blood pressure will be watched after the test. This information is not intended to replace advice given to you by  your health care provider. Make sure you discuss any questions you have with your health care provider. Document Released: 01/28/2009 Document Revised: 12/25/2015 Document Reviewed: 12/25/2015 Elsevier Interactive Patient Education  2019 Elsevier Inc.  

## 2018-06-04 NOTE — Progress Notes (Signed)
Cardiology Office Note:    Date:  06/04/2018   ID:  Clarence Schneider, DOB [birth year], MRN [id number]  PCP:  Algis Greenhouse, MD  Cardiologist:  Jenean Lindau, MD   Referring MD: Algis Greenhouse, MD    ASSESSMENT:    1. Essential hypertension   2. Mixed dyslipidemia   3. Coronary artery disease of native artery of native heart with stable angina pectoris ([place])   4. Hyperlipidemia, unspecified hyperlipidemia type   5. Abnormal nuclear stress test   6. Dizziness   7. CAD S/P percutaneous coronary angioplasty   8. Nonsustained ventricular tachycardia ([place])   9. Peripheral arterial disease ([place])    PLAN:    In order of problems listed above:  1. Secondary prevention stressed with the patient.  Importance of compliance with diet and medication stressed and he vocalized understanding.  His blood pressure is stable.  Diet was discussed for dyslipidemia. 2. He is fasting and will have blood work today including lipids 3. In view of his symptoms he will undergo exercise stress echo. 4. Patient will be seen in follow-up appointment in 6 months or earlier if the patient has any concerns 5. Sublingual nitroglycerin prescription was sent, its protocol and 911 protocol explained and the patient vocalized understanding questions were answered to the patient's satisfaction   Medication Adjustments/Labs and Tests Ordered: Current medicines are reviewed at length with the patient today.  Concerns regarding medicines are outlined above.  Orders Placed This Encounter  Procedures  . Basic metabolic panel  . CBC  . Hepatic function panel  . TSH  . Lipid panel  . ECHOCARDIOGRAM STRESS TEST   No orders of the defined types were placed in this encounter.    No chief complaint on file.    History of Present Illness:    Clarence Schneider is a 68 y.o. male.  Patient has known coronary artery disease.  He denies any problems at this time and takes care of activities of daily living.  He  occasionally has issues with chest discomfort which may not be related to exertion.  He is concerned about it and wants to be evaluated.  At the time of my evaluation, the patient is alert awake oriented and in no distress.  Past Medical History:  Diagnosis Date  . Arthritis   . Coronary artery disease    stents  . Dizziness   . Esophageal reflux   . Headache(784.0)   . Hyperlipemia   . Hypertension   . Lightheadedness    doesn't occur everyday; happens at times; once occured while walking up a incline  . Neuromuscular disorder (H[facility])    Takes Gabapentin  . Pinched nerve    in back  . Shortness of breath    with exertion  . Skin abrasion    under both arms; uses Lotrisone cream twice a day  . Sleep apnea    cpap doesnt use reg    Past Surgical History:  Procedure Laterality Date  . APPENDECTOMY    . CARDIAC CATHETERIZATION     X 3 stents  . CARPAL TUNNEL RELEASE Bilateral   . COLONOSCOPY W/ POLYPECTOMY    . CORONARY BALLOON ANGIOPLASTY N/A 06/21/2017   Procedure: CORONARY BALLOON ANGIOPLASTY;  Surgeon: Jettie Booze, MD;  Location: [facility];  Service: Cardiovascular;  Laterality: N/A;  . ELBOW ARTHROSCOPY Bilateral    ulner nerve   2 surgeries  . INGUINAL HERNIA REPAIR Right   . KNEE  ARTHROSCOPY Bilateral    Left x 1 Right X 2  . LEFT HEART CATH AND CORONARY ANGIOGRAPHY N/A 06/21/2017   Procedure: LEFT HEART CATH AND CORONARY ANGIOGRAPHY;  Surgeon: Jettie Booze, MD;  Location: [facility];  Service: Cardiovascular;  Laterality: N/A;  . SHOULDER ARTHROSCOPY Left   . TONSILLECTOMY    . TOTAL KNEE ARTHROPLASTY Left 05/11/2013   Procedure: LEFT TOTAL KNEE ARTHROPLASTY;  Surgeon: Vickey Huger, MD;  Location: [facility];  Service: Orthopedics;  Laterality: Left;  . TOTAL KNEE ARTHROPLASTY Right 11/30/2013   dr Ronnie Derby  . TOTAL KNEE ARTHROPLASTY Right 11/30/2013   Procedure: TOTAL KNEE ARTHROPLASTY;  Surgeon: Vickey Huger, MD;  Location: [facility];  Service:  Orthopedics;  Laterality: Right;    Current Medications: Current Meds  Medication Sig  . aspirin 81 MG EC tablet Take 81 mg by mouth daily.   . clopidogrel (PLAVIX) 75 MG tablet Take 1 tablet (75 mg total) by mouth daily.  Marland Kitchen ezetimibe (ZETIA) 10 MG tablet Take 1 tablet (10 mg total) by mouth daily.  . fenofibrate 160 MG tablet Take 1 tablet (160 mg total) by mouth every evening.  . hydrochlorothiazide (HYDRODIURIL) 12.5 MG tablet Take 1 tablet (12.5 mg total) by mouth daily.  . Multiple Vitamins-Minerals (MULTIVITAMIN WITH MINERALS) tablet Take 1 tablet by mouth daily.  Marland Kitchen omega-3 acid ethyl esters (LOVAZA) 1 g capsule Take 2 capsules (2 g total) by mouth 2 (two) times daily.  Marland Kitchen pyridoxine (B-6) 200 MG tablet Take 200 mg by mouth daily.  . rosuvastatin (CRESTOR) 40 MG tablet Take 1 tablet (40 mg total) by mouth daily.  . sildenafil (REVATIO) 20 MG tablet Take 20-40 mg by mouth as needed (for ED).   Marland Kitchen telmisartan (MICARDIS) 80 MG tablet Take 1 tablet (80 mg total) by mouth daily.  Marland Kitchen testosterone cypionate (DEPOTESTOTERONE CYPIONATE) 200 MG/ML injection Inject 300 mg into the muscle every 14 (fourteen) days.   Bethann Humble Sulfate (EYE DROPS RELIEF OP) Place 1 drop into both eyes 2 (two) times daily.  . traMADol (ULTRAM) 50 MG tablet Take 50 mg by mouth every 6 (six) hours as needed.   . vitamin B-12 (CYANOCOBALAMIN) 1000 MCG tablet Take 1,000 mcg by mouth daily.     Allergies:   Oxycontin [oxycodone hcl]; Reglan [metoclopramide]; and Tape   Social History   Socioeconomic History  . Marital status: Married    Spouse name: Not on file  . Number of children: Not on file  . Years of education: Not on file  . Highest education level: Not on file  Occupational History  . Not on file  Social Needs  . Financial resource strain: Not on file  . Food insecurity:    Worry: Not on file    Inability: Not on file  . Transportation needs:    Medical: Not on file    Non-medical: Not  on file  Tobacco Use  . Smoking status: Never Smoker  . Smokeless tobacco: Never Used  Substance and Sexual Activity  . Alcohol use: No  . Drug use: No  . Sexual activity: Not on file  Lifestyle  . Physical activity:    Days per week: Not on file    Minutes per session: Not on file  . Stress: Not on file  Relationships  . Social connections:    Talks on phone: Not on file    Gets together: Not on file    Attends religious service: Not on file  Active member of club or organization: Not on file    Attends meetings of clubs or organizations: Not on file    Relationship status: Not on file  Other Topics Concern  . Not on file  Social History Narrative  . Not on file     Family History: The patient's family history includes COPD in his brother; Cancer in his brother and father; Diabetes in his father; Heart attack in his brother; Hyperlipidemia in his father; Hypertension in his father and mother; Prostate cancer in his brother.  ROS:   Please see the history of present illness.    All other systems reviewed and are negative.  EKGs/Labs/Other Studies Reviewed:    The following studies were reviewed today: I discussed my findings with the patient at extensive length.   Recent Labs: 06/14/2017: Hemoglobin 16.7; Platelets 174 06/20/2017: ALT 37 06/28/2017: BUN 19; Creatinine, Ser 1.65; Potassium 4.4; Sodium 140  Recent Lipid Panel    Component Value Date/Time   CHOL 133 06/20/2017 0000   TRIG 93 06/20/2017 0000   HDL 36 (L) 06/20/2017 0000   CHOLHDL 3.7 06/20/2017 0000   LDLCALC 78 06/20/2017 0000    Physical Exam:    VS:  BP 122/68 (BP Location: Right Arm, Patient Position: Sitting, Cuff Size: Normal)   Pulse (!) 52   Ht 6\' 1"  (1.854 m)   Wt 222 lb (100.7 kg)   SpO2 98%   BMI 29.29 kg/m     Wt Readings from Last 3 Encounters:  06/04/18 222 lb (100.7 kg)  11/22/17 216 lb (98 kg)  07/16/17 221 lb 12.8 oz (100.6 kg)     GEN: Patient is in no acute  distress HEENT: Normal NECK: No JVD; No carotid bruits LYMPHATICS: No lymphadenopathy CARDIAC: Hear sounds regular, 2/6 systolic murmur at the apex. RESPIRATORY:  Clear to auscultation without rales, wheezing or rhonchi  ABDOMEN: Soft, non-tender, non-distended MUSCULOSKELETAL:  No edema; No deformity  SKIN: Warm and dry NEUROLOGIC:  Alert and oriented x 3 PSYCHIATRIC:  Normal affect   Signed, Jenean Lindau, MD  06/04/2018 10:18 AM    [facility]

## 2018-06-05 ENCOUNTER — Telehealth: Payer: Self-pay

## 2018-06-05 LAB — LIPID PANEL
CHOLESTEROL TOTAL: 117 mg/dL (ref 100–199)
Chol/HDL Ratio: 3.3 ratio (ref 0.0–5.0)
HDL: 36 mg/dL — ABNORMAL LOW (ref >39)
LDL Calculated: 64 mg/dL (ref 0–99)
Triglycerides: 83 mg/dL (ref 0–149)
VLDL Cholesterol Cal: 17 mg/dL (ref 5–40)

## 2018-06-05 LAB — BASIC METABOLIC PANEL
BUN/Creatinine Ratio: 9 — ABNORMAL LOW (ref 10–24)
BUN: 14 mg/dL (ref 8–27)
CALCIUM: 9.8 mg/dL (ref 8.6–10.2)
CHLORIDE: 101 mmol/L (ref 96–106)
CO2: 25 mmol/L (ref 20–29)
Creatinine, Ser: 1.58 mg/dL — ABNORMAL HIGH (ref 0.76–1.27)
GFR calc Af Amer: 52 mL/min/{1.73_m2} — ABNORMAL LOW (ref >59)
GFR calc non Af Amer: 45 mL/min/{1.73_m2} — ABNORMAL LOW (ref >59)
Glucose: 124 mg/dL — ABNORMAL HIGH (ref 65–99)
Potassium: 4.9 mmol/L (ref 3.5–5.2)
Sodium: 140 mmol/L (ref 134–144)

## 2018-06-05 LAB — CBC
HEMOGLOBIN: 16.7 g/dL (ref 13.0–17.7)
Hematocrit: 49.3 % (ref 37.5–51.0)
MCH: 30.8 pg (ref 26.6–33.0)
MCHC: 33.9 g/dL (ref 31.5–35.7)
MCV: 91 fL (ref 79–97)
PLATELETS: 191 10*3/uL (ref 150–450)
RBC: 5.42 x10E6/uL (ref 4.14–5.80)
RDW: 14 % (ref 11.6–15.4)
WBC: 4.8 10*3/uL (ref 3.4–10.8)

## 2018-06-05 LAB — HEPATIC FUNCTION PANEL
ALBUMIN: 4.3 g/dL (ref 3.8–4.8)
ALK PHOS: 76 IU/L (ref 39–117)
ALT: 34 IU/L (ref 0–44)
AST: 33 IU/L (ref 0–40)
Bilirubin Total: 0.5 mg/dL (ref 0.0–1.2)
Bilirubin, Direct: 0.18 mg/dL (ref 0.00–0.40)
TOTAL PROTEIN: 6.6 g/dL (ref 6.0–8.5)

## 2018-06-05 LAB — TSH: TSH: 2.99 u[IU]/mL (ref 0.450–4.500)

## 2018-06-05 NOTE — Telephone Encounter (Signed)
-----   Message from Jenean Lindau, MD sent at 06/05/2018 10:42 AM EST ----- The results of the study is unremarkable.  Stable renal function.  Please inform patient. I will discuss in detail at next appointment. Cc  primary care/referring physician Jenean Lindau, MD 06/05/2018 10:42 AM

## 2018-06-05 NOTE — Telephone Encounter (Signed)
RN relayed information to patient.He verbalized understanding with no further questions. Results sent to Dr. Trula Slade, per Dr. Docia Furl request.

## 2018-06-24 ENCOUNTER — Other Ambulatory Visit: Payer: Self-pay

## 2018-06-24 MED ORDER — TELMISARTAN 80 MG PO TABS: 80.0000 mg | ORAL_TABLET | Freq: Every day | ORAL | 2 refills | Status: DC

## 2018-06-24 NOTE — Telephone Encounter (Signed)
Ally at [facility] called requesting telmisartan refill.

## 2018-06-26 ENCOUNTER — Other Ambulatory Visit: Payer: Self-pay

## 2018-06-26 MED ORDER — HYDROCHLOROTHIAZIDE 12.5 MG PO TABS: 12.5000 mg | ORAL_TABLET | Freq: Every day | ORAL | 2 refills | Status: DC

## 2018-06-27 ENCOUNTER — Ambulatory Visit (INDEPENDENT_AMBULATORY_CARE_PROVIDER_SITE_OTHER): Payer: Medicare Other

## 2018-06-27 ENCOUNTER — Other Ambulatory Visit: Payer: Self-pay

## 2018-06-27 DIAGNOSIS — I10 Essential (primary) hypertension: Secondary | ICD-10-CM | POA: Diagnosis not present

## 2018-06-27 DIAGNOSIS — I25118 Atherosclerotic heart disease of native coronary artery with other forms of angina pectoris: Secondary | ICD-10-CM

## 2018-06-27 DIAGNOSIS — E785 Hyperlipidemia, unspecified: Secondary | ICD-10-CM

## 2018-06-27 DIAGNOSIS — R42 Dizziness and giddiness: Secondary | ICD-10-CM

## 2018-06-27 DIAGNOSIS — R9439 Abnormal result of other cardiovascular function study: Secondary | ICD-10-CM

## 2018-06-27 DIAGNOSIS — E782 Mixed hyperlipidemia: Secondary | ICD-10-CM | POA: Diagnosis not present

## 2018-06-27 NOTE — Progress Notes (Signed)
Stress echocardiogram has been performed.  Jimmy Neema Barreira RDCS, RVT

## 2018-07-21 ENCOUNTER — Other Ambulatory Visit: Payer: Self-pay

## 2018-07-21 ENCOUNTER — Telehealth: Payer: Self-pay

## 2018-07-21 MED ORDER — CARVEDILOL 12.5 MG PO TABS: 12.5000 mg | ORAL_TABLET | Freq: Two times a day (BID) | ORAL | 1 refills | Status: DC

## 2018-07-22 NOTE — Telephone Encounter (Signed)
Noted incoming communication in messages without information. Called and left voicemail message for patient to inquire if he has any needs/questions or concerns

## 2018-07-22 NOTE — Telephone Encounter (Signed)
Patient informed that carvedilol refill has been processed and received from pharmacy

## 2018-07-28 ENCOUNTER — Other Ambulatory Visit: Payer: Self-pay

## 2018-07-28 ENCOUNTER — Telehealth: Payer: Self-pay | Admitting: Cardiology

## 2018-07-28 DIAGNOSIS — I2510 Atherosclerotic heart disease of native coronary artery without angina pectoris: Secondary | ICD-10-CM

## 2018-07-28 MED ORDER — OMEGA-3-ACID ETHYL ESTERS 1 G PO CAPS: 2.0000 g | ORAL_CAPSULE | Freq: Two times a day (BID) | ORAL | 1 refills | Status: DC

## 2018-07-28 MED ORDER — OMEGA-3-ACID ETHYL ESTERS 1 G PO CAPS: 2.0000 g | ORAL_CAPSULE | Freq: Two times a day (BID) | ORAL | 3 refills | Status: DC

## 2018-07-28 NOTE — Telephone Encounter (Signed)
Omega 3 refill sent to [facility] on [address] [facility] [address] [facility]. Per patient preference

## 2018-07-28 NOTE — Telephone Encounter (Signed)
°*  STAT* If patient is at the pharmacy, call can be transferred to refill team.   1. Which medications need to be refilled? (please list name of each medication and dose if known) Omega-3-Acid  1GM 2. Which pharmacy/location (including street and city if local pharmacy) is medication to be sent to? [facility]. [address] 3. Do they need a 30 day or 90 day supply? 90 day

## 2018-10-09 ENCOUNTER — Other Ambulatory Visit: Payer: Self-pay | Admitting: Cardiology

## 2018-10-09 MED ORDER — CLOPIDOGREL BISULFATE 75 MG PO TABS: 75.0000 mg | ORAL_TABLET | Freq: Every day | ORAL | 0 refills | Status: DC

## 2018-10-09 NOTE — Telephone Encounter (Signed)
°*  STAT* If patient is at the pharmacy, call can be transferred to refill team.   1. Which medications need to be refilled? (please list name of each medication and dose if known) Clopidogrel 75mg   2. Which pharmacy/location (including street and city if local pharmacy) is medication to be sent to?[facility] [place]  3. Do they need a 30 day or 90 day supply? [place]

## 2018-10-09 NOTE — Telephone Encounter (Signed)
Plavix refill sent to [facility]

## 2018-11-04 ENCOUNTER — Other Ambulatory Visit: Payer: Self-pay | Admitting: Cardiology

## 2018-11-04 MED ORDER — FENOFIBRATE 160 MG PO TABS: 160.0000 mg | ORAL_TABLET | Freq: Every evening | ORAL | 0 refills | Status: DC

## 2018-11-04 NOTE — Telephone Encounter (Signed)
°*  STAT* If patient is at the pharmacy, call can be transferred to refill team.   1. Which medications need to be refilled? (please list name of each medication and dose if known) fenofibrate 160 MG tablet   2. Which pharmacy/location (including street and city if local pharmacy) is medication to be sent to? [place], [place] [phone] (Phone) [phone] (Fax)    3. Do they need a 30 day or 90 day supply? 90 day

## 2018-11-04 NOTE — Telephone Encounter (Signed)
Rx for fenofibrate sent to [facility] as requested.

## 2018-12-03 ENCOUNTER — Ambulatory Visit: Payer: Medicare Other | Admitting: Cardiology

## 2019-01-12 ENCOUNTER — Telehealth: Payer: Self-pay

## 2019-01-12 MED ORDER — CLOPIDOGREL BISULFATE 75 MG PO TABS: 75.0000 mg | ORAL_TABLET | Freq: Every day | ORAL | 0 refills | Status: DC

## 2019-01-12 NOTE — Telephone Encounter (Signed)
Requested Prescriptions   Signed Prescriptions Disp Refills  . clopidogrel (PLAVIX) 75 MG tablet 30 tablet 0    Sig: Take 1 tablet (75 mg total) by mouth daily. *NEEDS OFFICE FOR FURTHER REFILLS*    Authorizing Provider: Jenean Lindau    Ordering User: Raelene Bott, Lathyn Griggs L

## 2019-01-24 ENCOUNTER — Other Ambulatory Visit: Payer: Self-pay | Admitting: Cardiology

## 2019-01-24 DIAGNOSIS — I2510 Atherosclerotic heart disease of native coronary artery without angina pectoris: Secondary | ICD-10-CM

## 2019-01-26 NOTE — Telephone Encounter (Signed)
Rx refill sent to pharmacy. 

## 2019-01-27 ENCOUNTER — Other Ambulatory Visit: Payer: Self-pay | Admitting: Cardiology

## 2019-01-27 DIAGNOSIS — E782 Mixed hyperlipidemia: Secondary | ICD-10-CM

## 2019-01-30 ENCOUNTER — Other Ambulatory Visit: Payer: Self-pay

## 2019-01-30 MED ORDER — FENOFIBRATE 160 MG PO TABS: 160.0000 mg | ORAL_TABLET | Freq: Every evening | ORAL | 0 refills | Status: DC

## 2019-02-17 ENCOUNTER — Encounter: Payer: Self-pay | Admitting: Cardiology

## 2019-02-17 ENCOUNTER — Other Ambulatory Visit: Payer: Self-pay

## 2019-02-17 ENCOUNTER — Ambulatory Visit (INDEPENDENT_AMBULATORY_CARE_PROVIDER_SITE_OTHER): Payer: Medicare Other | Admitting: Cardiology

## 2019-02-17 VITALS — BP 130/80 | HR 53 | Ht 73.0 in | Wt 218.0 lb

## 2019-02-17 DIAGNOSIS — E782 Mixed hyperlipidemia: Secondary | ICD-10-CM | POA: Diagnosis not present

## 2019-02-17 DIAGNOSIS — I10 Essential (primary) hypertension: Secondary | ICD-10-CM | POA: Diagnosis not present

## 2019-02-17 DIAGNOSIS — I472 Ventricular tachycardia: Secondary | ICD-10-CM | POA: Diagnosis not present

## 2019-02-17 DIAGNOSIS — Z9861 Coronary angioplasty status: Secondary | ICD-10-CM

## 2019-02-17 DIAGNOSIS — I4729 Other ventricular tachycardia: Secondary | ICD-10-CM

## 2019-02-17 DIAGNOSIS — Z1329 Encounter for screening for other suspected endocrine disorder: Secondary | ICD-10-CM

## 2019-02-17 DIAGNOSIS — I2510 Atherosclerotic heart disease of native coronary artery without angina pectoris: Secondary | ICD-10-CM

## 2019-02-17 MED ORDER — CLOPIDOGREL BISULFATE 75 MG PO TABS: 75.0000 mg | ORAL_TABLET | Freq: Every day | ORAL | 1 refills | Status: DC

## 2019-02-17 NOTE — Progress Notes (Signed)
Cardiology Office Note:    Date:  02/17/2019   ID:  Clarence Schneider, DOB [birth year], MRN [id number]  PCP:  Algis Greenhouse, MD  Cardiologist:  Jenean Lindau, MD   Referring MD: Algis Greenhouse, MD    ASSESSMENT:    1. CAD S/P percutaneous coronary angioplasty   2. Nonsustained ventricular tachycardia ([place])   3. Essential hypertension   4. Mixed dyslipidemia    PLAN:    In order of problems listed above:  1. Coronary artery disease: Secondary prevention stressed with the patient.  Importance of compliance with diet and medication stressed and he vocalized understanding.  Importance of regular exercise stressed and protocol of 5 days a week at least 30 minutes a day was explained promises to do better. 2. Essential hypertension: Blood pressure is stable 3. Mixed dyslipidemia: Diet was discussed.  He will have blood work today.  Previous blood work was reviewed with him. 4. Patient will be seen in follow-up appointment in 6 months or earlier if the patient has any concerns    Medication Adjustments/Labs and Tests Ordered: Current medicines are reviewed at length with the patient today.  Concerns regarding medicines are outlined above.  No orders of the defined types were placed in this encounter.  No orders of the defined types were placed in this encounter.    Chief Complaint  Patient presents with  . Follow-up     History of Present Illness:    Clarence Schneider is a 68 y.o. male.  Patient has past medical history of coronary artery disease, essential hypertension and dyslipidemia.  Patient denies any problems at this time and takes care of activities of daily living.  No chest pain orthopnea or PND.  He does not have a regular exercise protocol.  At the time of my evaluation, the patient is alert awake oriented and in no distress.  Past Medical History:  Diagnosis Date  . Arthritis   . Coronary artery disease    stents  . Dizziness   . Esophageal reflux   .  Headache(784.0)   . Hyperlipemia   . Hypertension   . Lightheadedness    doesn't occur everyday; happens at times; once occured while walking up a incline  . Neuromuscular disorder (HCC)    Takes Gabapentin  . Pinched nerve    in back  . Shortness of breath    with exertion  . Skin abrasion    under both arms; uses Lotrisone cream twice a day  . Sleep apnea    cpap doesnt use reg    Past Surgical History:  Procedure Laterality Date  . APPENDECTOMY    . CARDIAC CATHETERIZATION     X 3 stents  . CARPAL TUNNEL RELEASE Bilateral   . COLONOSCOPY W/ POLYPECTOMY    . CORONARY BALLOON ANGIOPLASTY N/A 06/21/2017   Procedure: CORONARY BALLOON ANGIOPLASTY;  Surgeon: Jettie Booze, MD;  Location: [facility];  Service: Cardiovascular;  Laterality: N/A;  . ELBOW ARTHROSCOPY Bilateral    ulner nerve   2 surgeries  . INGUINAL HERNIA REPAIR Right   . KNEE ARTHROSCOPY Bilateral    Left x 1 Right X 2  . LEFT HEART CATH AND CORONARY ANGIOGRAPHY N/A 06/21/2017   Procedure: LEFT HEART CATH AND CORONARY ANGIOGRAPHY;  Surgeon: Jettie Booze, MD;  Location: [facility];  Service: Cardiovascular;  Laterality: N/A;  . SHOULDER ARTHROSCOPY Left   . TONSILLECTOMY    . TOTAL KNEE ARTHROPLASTY  Left 05/11/2013   Procedure: LEFT TOTAL KNEE ARTHROPLASTY;  Surgeon: Vickey Huger, MD;  Location: [facility];  Service: Orthopedics;  Laterality: Left;  . TOTAL KNEE ARTHROPLASTY Right 11/30/2013   dr Ronnie Derby  . TOTAL KNEE ARTHROPLASTY Right 11/30/2013   Procedure: TOTAL KNEE ARTHROPLASTY;  Surgeon: Vickey Huger, MD;  Location: [facility];  Service: Orthopedics;  Laterality: Right;    Current Medications: Current Meds  Medication Sig  . aspirin 81 MG EC tablet Take 81 mg by mouth daily.   . carvedilol (COREG) 12.5 MG tablet TAKE 1 TABLET BY MOUTH TWICE A DAY.  Marland Kitchen clopidogrel (PLAVIX) 75 MG tablet Take 1 tablet (75 mg total) by mouth daily. *NEEDS OFFICE FOR FURTHER REFILLS*  . ezetimibe (ZETIA) 10 MG  tablet TAKE 1 TABLET BY MOUTH DAILY.  . fenofibrate 160 MG tablet Take 1 tablet (160 mg total) by mouth every evening.  . fexofenadine (ALLEGRA) 180 MG tablet Take 180 mg by mouth daily.  . hydrochlorothiazide (HYDRODIURIL) 12.5 MG tablet Take 1 tablet (12.5 mg total) by mouth daily.  . montelukast (SINGULAIR) 10 MG tablet Take 10 mg by mouth daily.  . Multiple Vitamins-Minerals (MULTIVITAMIN WITH MINERALS) tablet Take 1 tablet by mouth daily.  . nitroGLYCERIN (NITROSTAT) 0.4 MG SL tablet Place 1 tablet (0.4 mg total) under the tongue every 5 (five) minutes as needed.  Marland Kitchen omega-3 acid ethyl esters (LOVAZA) 1 g capsule TAKE 2 CAPSULES BY MOUTH TWICE A DAY.  Marland Kitchen pyridoxine (B-6) 200 MG tablet Take 200 mg by mouth daily.  . rosuvastatin (CRESTOR) 40 MG tablet TAKE 1 TABLET BY MOUTH DAILY.  . sildenafil (REVATIO) 20 MG tablet Take 20-40 mg by mouth as needed (for ED).   Marland Kitchen telmisartan (MICARDIS) 80 MG tablet Take 1 tablet (80 mg total) by mouth daily.  Marland Kitchen testosterone cypionate (DEPOTESTOTERONE CYPIONATE) 200 MG/ML injection Inject 300 mg into the muscle every 14 (fourteen) days.   Bethann Humble Sulfate (EYE DROPS RELIEF OP) Place 1 drop into both eyes 2 (two) times daily.  . vitamin B-12 (CYANOCOBALAMIN) 1000 MCG tablet Take 1,000 mcg by mouth daily.     Allergies:   Oxycontin [oxycodone hcl], Reglan [metoclopramide], and Tape   Social History   Socioeconomic History  . Marital status: Married    Spouse name: Not on file  . Number of children: Not on file  . Years of education: Not on file  . Highest education level: Not on file  Occupational History  . Not on file  Social Needs  . Financial resource strain: Not on file  . Food insecurity    Worry: Not on file    Inability: Not on file  . Transportation needs    Medical: Not on file    Non-medical: Not on file  Tobacco Use  . Smoking status: Never Smoker  . Smokeless tobacco: Never Used  Substance and Sexual Activity  .  Alcohol use: No  . Drug use: No  . Sexual activity: Not on file  Lifestyle  . Physical activity    Days per week: Not on file    Minutes per session: Not on file  . Stress: Not on file  Relationships  . Social Herbalist on phone: Not on file    Gets together: Not on file    Attends religious service: Not on file    Active member of club or organization: Not on file    Attends meetings of clubs or organizations: Not on file  Relationship status: Not on file  Other Topics Concern  . Not on file  Social History Narrative  . Not on file     Family History: The patient's family history includes COPD in his brother; Cancer in his brother and father; Diabetes in his father; Heart attack in his brother; Hyperlipidemia in his father; Hypertension in his father and mother; Prostate cancer in his brother.  ROS:   Please see the history of present illness.    All other systems reviewed and are negative.  EKGs/Labs/Other Studies Reviewed:    The following studies were reviewed today: EKG reveals sinus rhythm and nonspecific ST-T changes.   Recent Labs: 06/04/2018: ALT 34; BUN 14; Creatinine, Ser 1.58; Hemoglobin 16.7; Platelets 191; Potassium 4.9; Sodium 140; TSH 2.990  Recent Lipid Panel    Component Value Date/Time   CHOL 117 06/04/2018 1002   TRIG 83 06/04/2018 1002   HDL 36 (L) 06/04/2018 1002   CHOLHDL 3.3 06/04/2018 1002   LDLCALC 64 06/04/2018 1002    Physical Exam:    VS:  BP 130/80 (BP Location: Left Arm, Patient Position: Sitting, Cuff Size: Normal)   Pulse (!) 53   Ht 6\' 1"  (1.854 m)   Wt 218 lb (98.9 kg)   SpO2 99%   BMI 28.76 kg/m     Wt Readings from Last 3 Encounters:  02/17/19 218 lb (98.9 kg)  06/04/18 222 lb (100.7 kg)  11/22/17 216 lb (98 kg)     GEN: Patient is in no acute distress HEENT: Normal NECK: No JVD; No carotid bruits LYMPHATICS: No lymphadenopathy CARDIAC: Hear sounds regular, 2/6 systolic murmur at the apex.  RESPIRATORY:  Clear to auscultation without rales, wheezing or rhonchi  ABDOMEN: Soft, non-tender, non-distended MUSCULOSKELETAL:  No edema; No deformity  SKIN: Warm and dry NEUROLOGIC:  Alert and oriented x 3 PSYCHIATRIC:  Normal affect   Signed, Jenean Lindau, MD  02/17/2019 8:23 AM    [place]

## 2019-02-17 NOTE — Patient Instructions (Addendum)
Medication Instructions:  Your physician recommends that you continue on your current medications as directed. Please refer to the Current Medication list given to you today.  *If you need a refill on your cardiac medications before your next appointment, please call your pharmacy*  Lab Work: Your physician recommends that you have a BMP, CBC, TSH, hepatic and lipid drawn today If you have labs (blood work) drawn today and your tests are completely normal, you will receive your results only by: . MyChart Message (if you have MyChart) OR . A paper copy in the mail If you have any lab test that is abnormal or we need to change your treatment, we will call you to review the results.  Testing/Procedures: You had an EKG performed today  Follow-Up: At [facility], you and your health needs are our priority.  As part of our continuing mission to provide you with exceptional heart care, we have created designated Provider Care Teams.  These Care Teams include your primary Cardiologist (physician) and Advanced Practice Providers (APPs -  Physician Assistants and Nurse Practitioners) who all work together to provide you with the care you need, when you need it.  Your next appointment:   6 months  The format for your next appointment:   In Person  Provider:   Rajan Revankar, MD    

## 2019-02-17 NOTE — Addendum Note (Signed)
Addended by: Beckey Rutter on: 02/17/2019 09:08 AM   Modules accepted: Orders

## 2019-02-18 LAB — BASIC METABOLIC PANEL
BUN/Creatinine Ratio: 11 (ref 10–24)
BUN: 17 mg/dL (ref 8–27)
CO2: 29 mmol/L (ref 20–29)
Calcium: 9.5 mg/dL (ref 8.6–10.2)
Chloride: 99 mmol/L (ref 96–106)
Creatinine, Ser: 1.53 mg/dL — ABNORMAL HIGH (ref 0.76–1.27)
GFR calc Af Amer: 54 mL/min/{1.73_m2} — ABNORMAL LOW (ref >59)
GFR calc non Af Amer: 47 mL/min/{1.73_m2} — ABNORMAL LOW (ref >59)
Glucose: 122 mg/dL — ABNORMAL HIGH (ref 65–99)
Potassium: 4.3 mmol/L (ref 3.5–5.2)
Sodium: 138 mmol/L (ref 134–144)

## 2019-02-18 LAB — LIPID PANEL
Chol/HDL Ratio: 3.6 ratio (ref 0.0–5.0)
Cholesterol, Total: 114 mg/dL (ref 100–199)
HDL: 32 mg/dL — ABNORMAL LOW (ref >39)
LDL Chol Calc (NIH): 63 mg/dL (ref 0–99)
Triglycerides: 100 mg/dL (ref 0–149)
VLDL Cholesterol Cal: 19 mg/dL (ref 5–40)

## 2019-02-18 LAB — CBC
Hematocrit: 47.3 % (ref 37.5–51.0)
Hemoglobin: 16.2 g/dL (ref 13.0–17.7)
MCH: 31.2 pg (ref 26.6–33.0)
MCHC: 34.2 g/dL (ref 31.5–35.7)
MCV: 91 fL (ref 79–97)
Platelets: 178 10*3/uL (ref 150–450)
RBC: 5.2 x10E6/uL (ref 4.14–5.80)
RDW: 13.4 % (ref 11.6–15.4)
WBC: 5 10*3/uL (ref 3.4–10.8)

## 2019-02-18 LAB — HEPATIC FUNCTION PANEL
ALT: 35 IU/L (ref 0–44)
AST: 32 IU/L (ref 0–40)
Albumin: 4.3 g/dL (ref 3.8–4.8)
Alkaline Phosphatase: 65 IU/L (ref 39–117)
Bilirubin Total: 0.4 mg/dL (ref 0.0–1.2)
Bilirubin, Direct: 0.18 mg/dL (ref 0.00–0.40)
Total Protein: 6.4 g/dL (ref 6.0–8.5)

## 2019-02-18 LAB — TSH: TSH: 3.12 u[IU]/mL (ref 0.450–4.500)

## 2019-02-25 ENCOUNTER — Telehealth: Payer: Self-pay

## 2019-02-25 NOTE — Telephone Encounter (Signed)
Left message for patient to call back for results, copy sent to Dr. Garlon Hatchet

## 2019-02-25 NOTE — Telephone Encounter (Signed)
-----   Message from Jenean Lindau, MD sent at 02/18/2019  8:11 AM EST ----- The results of the study is unremarkable.  Renal function is stable.  Please inform patient. I will discuss in detail at next appointment. Cc  primary care/referring physician Jenean Lindau, MD 02/18/2019 8:11 AM

## 2019-02-26 ENCOUNTER — Other Ambulatory Visit: Payer: Self-pay

## 2019-02-26 NOTE — Telephone Encounter (Signed)
Results relayed,

## 2019-03-06 ENCOUNTER — Other Ambulatory Visit: Payer: Self-pay | Admitting: Cardiology

## 2019-03-06 NOTE — Telephone Encounter (Signed)
Hydrochlorothiazide refill sent to [facility]

## 2019-04-08 ENCOUNTER — Other Ambulatory Visit: Payer: Self-pay | Admitting: Cardiology

## 2019-04-21 ENCOUNTER — Other Ambulatory Visit: Payer: Self-pay | Admitting: Cardiology

## 2019-04-21 DIAGNOSIS — I2510 Atherosclerotic heart disease of native coronary artery without angina pectoris: Secondary | ICD-10-CM

## 2019-04-29 ENCOUNTER — Other Ambulatory Visit: Payer: Self-pay | Admitting: Cardiology

## 2019-05-06 ENCOUNTER — Other Ambulatory Visit: Payer: Self-pay | Admitting: Cardiology

## 2019-05-06 DIAGNOSIS — E782 Mixed hyperlipidemia: Secondary | ICD-10-CM

## 2019-05-26 DIAGNOSIS — L723 Sebaceous cyst: Secondary | ICD-10-CM | POA: Insufficient documentation

## 2019-05-26 HISTORY — DX: Sebaceous cyst: L72.3

## 2019-06-01 ENCOUNTER — Other Ambulatory Visit: Payer: Self-pay | Admitting: Cardiology

## 2019-08-05 ENCOUNTER — Other Ambulatory Visit: Payer: Self-pay | Admitting: Cardiology

## 2019-08-05 DIAGNOSIS — E782 Mixed hyperlipidemia: Secondary | ICD-10-CM

## 2019-08-17 ENCOUNTER — Other Ambulatory Visit: Payer: Self-pay

## 2019-08-18 ENCOUNTER — Other Ambulatory Visit: Payer: Self-pay

## 2019-08-18 ENCOUNTER — Ambulatory Visit: Payer: Medicare Other | Admitting: Cardiology

## 2019-08-18 ENCOUNTER — Encounter: Payer: Self-pay | Admitting: Cardiology

## 2019-08-18 VITALS — BP 118/70 | HR 60 | Ht 73.0 in | Wt 222.0 lb

## 2019-08-18 DIAGNOSIS — I2510 Atherosclerotic heart disease of native coronary artery without angina pectoris: Secondary | ICD-10-CM | POA: Diagnosis not present

## 2019-08-18 DIAGNOSIS — I472 Ventricular tachycardia: Secondary | ICD-10-CM

## 2019-08-18 DIAGNOSIS — Z9861 Coronary angioplasty status: Secondary | ICD-10-CM

## 2019-08-18 DIAGNOSIS — I10 Essential (primary) hypertension: Secondary | ICD-10-CM | POA: Diagnosis not present

## 2019-08-18 DIAGNOSIS — E782 Mixed hyperlipidemia: Secondary | ICD-10-CM

## 2019-08-18 DIAGNOSIS — N289 Disorder of kidney and ureter, unspecified: Secondary | ICD-10-CM

## 2019-08-18 DIAGNOSIS — I4729 Other ventricular tachycardia: Secondary | ICD-10-CM

## 2019-08-18 NOTE — Progress Notes (Signed)
Cardiology Office Note:    Date:  08/18/2019   ID:  Clarence Schneider, DOB [birth year], MRN [id number]  PCP:  Clarence Greenhouse, MD  Cardiologist:  Clarence Lindau, MD   Referring MD: Clarence Greenhouse, MD    ASSESSMENT:    1. Benign essential hypertension   2. CAD S/P percutaneous coronary angioplasty   3. Coronary artery disease involving native coronary artery of native heart without angina pectoris   4. Mixed hyperlipidemia   5. Nonsustained ventricular tachycardia (HCC)    PLAN:    In order of problems listed above:  1. Coronary artery disease: Secondary prevention stressed with patient.  Importance of compliance with diet medication stressed and he vocalized understanding.  His effort tolerance is good.  He exercises regularly.  Weight reduction was stressed and diet was emphasized. 2. Essential hypertension: Blood pressure stable and diet was emphasized 3. Mixed dyslipidemia: Diet was emphasized weight reduction was stressed and he will have blood work today including fasting lipids 4. Chronic renal insufficiency: Stable and managed by primary care physician. 5. Patient will be seen in follow-up appointment in 6 months or earlier if the patient has any concerns    Medication Adjustments/Labs and Tests Ordered: Current medicines are reviewed at length with the patient today.  Concerns regarding medicines are outlined above.  No orders of the defined types were placed in this encounter.  No orders of the defined types were placed in this encounter.    Chief Complaint  Patient presents with  . Follow-up     History of Present Illness:    Clarence Schneider is a 69 y.o. male.  Patient has past medical history of coronary artery disease, essential hypertension dyslipidemia and renal insufficiency.  He denies any problems at this time and takes care of activities of daily living.  No chest pain orthopnea or PND.  He mentions to me that he occasionally has chest discomfort.   I asked him whether he walks regularly and he says he walks more than half an hour a day.  He walked last night for half an hour without any symptoms.  At the time of my evaluation, the patient is alert awake oriented and in no distress.  Past Medical History:  Diagnosis Date  . Adenomatous polyp of colon 03/20/2016   Overview:  2007  Formatting of this note might be different from the original. 2007  . Allergic rhinitis 03/19/2016  . Arthritis   . Arthritis of right knee 03/26/2013  . Back strain 07/05/2017   2019: right  . Benign essential hypertension 12/27/2014   Formatting of this note might be different from the original. 1997: dx 2002: rx  . Bladder disorder 05/21/2016  . CAD S/P percutaneous coronary angioplasty 06/21/2017  . Chronic frontal sinusitis 10/23/2017   2019  Formatting of this note might be different from the original. 2019  . Chronic lumbar pain 03/19/2016  . Chronic pain of both knees 07/12/2016  . Chronic right shoulder pain 07/12/2016  . Coronary artery disease    stents  . Coronary artery disease involving native coronary artery of native heart 03/19/2016   Formatting of this note might be different from the original. 2005: onset sx 2005: cath diffuse disease, dissection/occlusion RCA, stent  . Dizziness   . Esophageal reflux   . Headache(784.0)   . History of GI bleed 01/07/2018   Formatting of this note might be different from the original. 2019: melena, GI eval, continue ASA  .  Hyperlipemia   . Hypertension   . Hypotestosteronism 05/27/2015  . Impingement syndrome of left shoulder 06/03/2014  . Impotence 05/26/2015  . Lateral epicondylitis of right elbow 01/04/2016   Overview:  2017  Formatting of this note might be different from the original. 2017  . Lightheadedness    doesn't occur everyday; happens at times; once occured while walking up a incline  . Lumbar radicular syndrome 09/02/2015  . Mixed dyslipidemia 11/06/2016  . Mixed hyperlipidemia 03/19/2016   Overview:   1998: LDL 187 TG 317  Formatting of this note might be different from the original. 1998: LDL 187 TG 317  . Neuromuscular disorder (HCC)    Takes Gabapentin  . Nonsustained ventricular tachycardia ([place]) 06/14/2017  . Obstructive sleep apnea 03/19/2016   Overview:  2012: dx, AHI 69 with desaturation, BiPAP  Formatting of this note might be different from the original. Split-night sleep study 10/25/2017, AHI 63.8, CPAP 20  . Peripheral arterial disease ([place]) 03/19/2016  . Pinched nerve    in back  . Pre-diabetes 06/18/2016  . S/P arthroscopy of right shoulder 06/28/2014  . S/P total knee arthroplasty 05/11/2013  . S/P total knee replacement using cement 11/30/2013  . Screening for colon cancer 03/19/2016  . Screening for prostate cancer 03/19/2016  . Sebaceous cyst 05/26/2019   Formatting of this note might be different from the original. 2021: post neck excision  . Shortness of breath    with exertion  . Sinusitis 09/02/2015   Overview:  2018:   Overview:  2018: eth  . Skin abrasion    under both arms; uses Lotrisone cream twice a day  . Sleep apnea    cpap doesnt use reg  . Slow heart rate 06/14/2017  . Subacromial bursitis of right shoulder joint 01/04/2016   Overview:  2017  Formatting of this note might be different from the original. 2017  . Trigeminal neuralgia 03/19/2016    Past Surgical History:  Procedure Laterality Date  . APPENDECTOMY    . CARDIAC CATHETERIZATION     X 3 stents  . CARPAL TUNNEL RELEASE Bilateral   . COLONOSCOPY W/ POLYPECTOMY    . CORONARY BALLOON ANGIOPLASTY N/A 06/21/2017   Procedure: CORONARY BALLOON ANGIOPLASTY;  Surgeon: Jettie Booze, MD;  Location: [place] [facility];  Service: Cardiovascular;  Laterality: N/A;  . ELBOW ARTHROSCOPY Bilateral    ulner nerve   2 surgeries  . INGUINAL HERNIA REPAIR Right   . KNEE ARTHROSCOPY Bilateral    Left x 1 Right X 2  . LEFT HEART CATH AND CORONARY ANGIOGRAPHY N/A 06/21/2017   Procedure: LEFT HEART CATH AND CORONARY  ANGIOGRAPHY;  Surgeon: Jettie Booze, MD;  Location: [place] [facility];  Service: Cardiovascular;  Laterality: N/A;  . SHOULDER ARTHROSCOPY Left   . TONSILLECTOMY    . TOTAL KNEE ARTHROPLASTY Left 05/11/2013   Procedure: LEFT TOTAL KNEE ARTHROPLASTY;  Surgeon: Vickey Huger, MD;  Location: [facility];  Service: Orthopedics;  Laterality: Left;  . TOTAL KNEE ARTHROPLASTY Right 11/30/2013   dr Ronnie Derby  . TOTAL KNEE ARTHROPLASTY Right 11/30/2013   Procedure: TOTAL KNEE ARTHROPLASTY;  Surgeon: Vickey Huger, MD;  Location: [facility];  Service: Orthopedics;  Laterality: Right;    Current Medications: Current Meds  Medication Sig  . ascorbic acid (VITAMIN C) 1000 MG tablet Take 1,000 mg by mouth daily.  Marland Kitchen aspirin 81 MG EC tablet Take 81 mg by mouth daily.   . carvedilol (COREG) 12.5 MG tablet TAKE 1  TABLET BY MOUTH TWICE A DAY.  Marland Kitchen clopidogrel (PLAVIX) 75 MG tablet Take 1 tablet (75 mg total) by mouth daily. Please call office to schedule follow up visit.  Marland Kitchen ezetimibe (ZETIA) 10 MG tablet Take 1 tablet (10 mg total) by mouth daily. Please call office to schedule follow up appointment.  . fenofibrate 160 MG tablet TAKE 1 TABLET BY MOUTH EVERY EVENING.  . fexofenadine (ALLEGRA) 180 MG tablet Take 180 mg by mouth daily.  . hydrochlorothiazide (HYDRODIURIL) 12.5 MG tablet TAKE 1 TABLET BY MOUTH ONCE DAILY.  . montelukast (SINGULAIR) 10 MG tablet Take 10 mg by mouth daily.  . Multiple Vitamins-Minerals (MULTIVITAMIN WITH MINERALS) tablet Take 1 tablet by mouth daily.  . nitroGLYCERIN (NITROSTAT) 0.4 MG SL tablet Place 1 tablet (0.4 mg total) under the tongue every 5 (five) minutes as needed.  Marland Kitchen omega-3 acid ethyl esters (LOVAZA) 1 g capsule TAKE 2 CAPSULES BY MOUTH TWICE A DAY.  Marland Kitchen pyridoxine (B-6) 200 MG tablet Take 200 mg by mouth daily.  . rosuvastatin (CRESTOR) 40 MG tablet Take 1 tablet (40 mg total) by mouth daily. Please call office to schedule follow up appointment.  . sildenafil (REVATIO) 20 MG tablet  TAKE UP TO 5 TABLETS AS DIRECTED ONCE DAILY IF NEEDED FOR ERECTILE DYSFUNCTION.  Marland Kitchen telmisartan (MICARDIS) 80 MG tablet TAKE 1 TABLET BY MOUTH DAILY  . testosterone cypionate (DEPOTESTOSTERONE CYPIONATE) 200 MG/ML injection Inject 1.25 mLs into the muscle every 14 (fourteen) days.  Bethann Humble Sulfate (EYE DROPS RELIEF OP) Place 1 drop into both eyes 2 (two) times daily.  . vitamin B-12 (CYANOCOBALAMIN) 1000 MCG tablet Take 1,000 mcg by mouth daily.     Allergies:   Oxycontin [oxycodone hcl], Reglan [metoclopramide], and Tape   Social History   Socioeconomic History  . Marital status: Married    Spouse name: Not on file  . Number of children: Not on file  . Years of education: Not on file  . Highest education level: Not on file  Occupational History  . Not on file  Tobacco Use  . Smoking status: Never Smoker  . Smokeless tobacco: Never Used  Substance and Sexual Activity  . Alcohol use: No  . Drug use: No  . Sexual activity: Not on file  Other Topics Concern  . Not on file  Social History Narrative  . Not on file   Social Determinants of Health   Financial Resource Strain:   . Difficulty of Paying Living Expenses:   Food Insecurity:   . Worried About Charity fundraiser in the Last Year:   . Arboriculturist in the Last Year:   Transportation Needs:   . Film/video editor (Medical):   Marland Kitchen Lack of Transportation (Non-Medical):   Physical Activity:   . Days of Exercise per Week:   . Minutes of Exercise per Session:   Stress:   . Feeling of Stress :   Social Connections:   . Frequency of Communication with Friends and Family:   . Frequency of Social Gatherings with Friends and Family:   . Attends Religious Services:   . Active Member of Clubs or Organizations:   . Attends Archivist Meetings:   Marland Kitchen Marital Status:      Family History: The patient's family history includes COPD in his brother; Cancer in his brother and father; Diabetes in his  father; Heart attack in his brother; Hyperlipidemia in his father; Hypertension in his father and mother; Prostate cancer in  his brother.  ROS:   Please see the history of present illness.    All other systems reviewed and are negative.  EKGs/Labs/Other Studies Reviewed:    The following studies were reviewed today: EKG reveals sinus rhythm and nonspecific ST-T changes.   Recent Labs: 02/17/2019: ALT 35; BUN 17; Creatinine, Ser 1.53; Hemoglobin 16.2; Platelets 178; Potassium 4.3; Sodium 138; TSH 3.120  Recent Lipid Panel    Component Value Date/Time   CHOL 114 02/17/2019 0848   TRIG 100 02/17/2019 0848   HDL 32 (L) 02/17/2019 0848   CHOLHDL 3.6 02/17/2019 0848   LDLCALC 63 02/17/2019 0848    Physical Exam:    VS:  BP 118/70   Pulse 60   Ht 6\' 1"  (1.854 m)   Wt 222 lb (100.7 kg)   SpO2 97%   BMI 29.29 kg/m     Wt Readings from Last 3 Encounters:  08/18/19 222 lb (100.7 kg)  02/17/19 218 lb (98.9 kg)  06/04/18 222 lb (100.7 kg)     GEN: Patient is in no acute distress HEENT: Normal NECK: No JVD; No carotid bruits LYMPHATICS: No lymphadenopathy CARDIAC: Hear sounds regular, 2/6 systolic murmur at the apex. RESPIRATORY:  Clear to auscultation without rales, wheezing or rhonchi  ABDOMEN: Soft, non-tender, non-distended MUSCULOSKELETAL:  No edema; No deformity  SKIN: Warm and dry NEUROLOGIC:  Alert and oriented x 3 PSYCHIATRIC:  Normal affect   Signed, Clarence Lindau, MD  08/18/2019 8:46 AM    [place]

## 2019-08-18 NOTE — Patient Instructions (Signed)
Medication Instructions:  No medication changes. *If you need a refill on your cardiac medications before your next appointment, please call your pharmacy*   Lab Work: Your physician recommends that you have labs done today in the office. We are checking a bmet, cbc, tsh, lft's and lipids.  If you have labs (blood work) drawn today and your tests are completely normal, you will receive your results only by: Marland Kitchen MyChart Message (if you have MyChart) OR . A paper copy in the mail If you have any lab test that is abnormal or we need to change your treatment, we will call you to review the results.   Testing/Procedures: None ordered   Follow-Up: At [facility], you and your health needs are our priority.  As part of our continuing mission to provide you with exceptional heart care, we have created designated Provider Care Teams.  These Care Teams include your primary Cardiologist (physician) and Advanced Practice Providers (APPs -  Physician Assistants and Nurse Practitioners) who all work together to provide you with the care you need, when you need it.  We recommend signing up for the patient portal called "MyChart".  Sign up information is provided on this After Visit Summary.  MyChart is used to connect with patients for Virtual Visits (Telemedicine).  Patients are able to view lab/test results, encounter notes, upcoming appointments, etc.  Non-urgent messages can be sent to your provider as well.   To learn more about what you can do with MyChart, go to [email or web address].    Your next appointment:   6 month(s)  The format for your next appointment:   In Person  Provider:   Jyl Heinz, MD   Other Instructions NA

## 2019-08-19 LAB — CBC WITH DIFFERENTIAL/PLATELET
Basophils Absolute: 0 10*3/uL (ref 0.0–0.2)
Basos: 1 %
EOS (ABSOLUTE): 0.1 10*3/uL (ref 0.0–0.4)
Eos: 2 %
Hematocrit: 48.3 % (ref 37.5–51.0)
Hemoglobin: 16.7 g/dL (ref 13.0–17.7)
Immature Grans (Abs): 0 10*3/uL (ref 0.0–0.1)
Immature Granulocytes: 0 %
Lymphocytes Absolute: 1.1 10*3/uL (ref 0.7–3.1)
Lymphs: 17 %
MCH: 31.6 pg (ref 26.6–33.0)
MCHC: 34.6 g/dL (ref 31.5–35.7)
MCV: 92 fL (ref 79–97)
Monocytes Absolute: 0.7 10*3/uL (ref 0.1–0.9)
Monocytes: 11 %
Neutrophils Absolute: 4.2 10*3/uL (ref 1.4–7.0)
Neutrophils: 69 %
Platelets: 181 10*3/uL (ref 150–450)
RBC: 5.28 x10E6/uL (ref 4.14–5.80)
RDW: 13.8 % (ref 11.6–15.4)
WBC: 6.1 10*3/uL (ref 3.4–10.8)

## 2019-08-19 LAB — LIPID PANEL
Chol/HDL Ratio: 3.6 ratio (ref 0.0–5.0)
Cholesterol, Total: 118 mg/dL (ref 100–199)
HDL: 33 mg/dL — ABNORMAL LOW (ref >39)
LDL Chol Calc (NIH): 61 mg/dL (ref 0–99)
Triglycerides: 137 mg/dL (ref 0–149)
VLDL Cholesterol Cal: 24 mg/dL (ref 5–40)

## 2019-08-19 LAB — BASIC METABOLIC PANEL
BUN/Creatinine Ratio: 9 — ABNORMAL LOW (ref 10–24)
BUN: 16 mg/dL (ref 8–27)
CO2: 27 mmol/L (ref 20–29)
Calcium: 9.6 mg/dL (ref 8.6–10.2)
Chloride: 102 mmol/L (ref 96–106)
Creatinine, Ser: 1.69 mg/dL — ABNORMAL HIGH (ref 0.76–1.27)
GFR calc Af Amer: 48 mL/min/{1.73_m2} — ABNORMAL LOW (ref >59)
GFR calc non Af Amer: 41 mL/min/{1.73_m2} — ABNORMAL LOW (ref >59)
Glucose: 121 mg/dL — ABNORMAL HIGH (ref 65–99)
Potassium: 4.3 mmol/L (ref 3.5–5.2)
Sodium: 140 mmol/L (ref 134–144)

## 2019-08-19 LAB — HEPATIC FUNCTION PANEL
ALT: 32 IU/L (ref 0–44)
AST: 31 IU/L (ref 0–40)
Albumin: 4.3 g/dL (ref 3.8–4.8)
Alkaline Phosphatase: 87 IU/L (ref 39–117)
Bilirubin Total: 0.4 mg/dL (ref 0.0–1.2)
Bilirubin, Direct: 0.16 mg/dL (ref 0.00–0.40)
Total Protein: 6.5 g/dL (ref 6.0–8.5)

## 2019-08-19 LAB — TSH: TSH: 2.58 u[IU]/mL (ref 0.450–4.500)

## 2019-08-19 NOTE — Addendum Note (Signed)
Addended by: Truddie Hidden on: 08/19/2019 02:14 PM   Modules accepted: Orders

## 2019-08-27 ENCOUNTER — Other Ambulatory Visit: Payer: Self-pay | Admitting: Cardiology

## 2019-09-04 ENCOUNTER — Other Ambulatory Visit: Payer: Self-pay | Admitting: Cardiology

## 2019-09-24 ENCOUNTER — Other Ambulatory Visit: Payer: Self-pay | Admitting: Cardiology

## 2019-09-26 ENCOUNTER — Other Ambulatory Visit: Payer: Self-pay | Admitting: Cardiology

## 2019-09-26 DIAGNOSIS — I2510 Atherosclerotic heart disease of native coronary artery without angina pectoris: Secondary | ICD-10-CM

## 2019-09-26 DIAGNOSIS — E782 Mixed hyperlipidemia: Secondary | ICD-10-CM

## 2019-09-30 ENCOUNTER — Other Ambulatory Visit: Payer: Self-pay

## 2019-09-30 DIAGNOSIS — I2510 Atherosclerotic heart disease of native coronary artery without angina pectoris: Secondary | ICD-10-CM

## 2019-09-30 MED ORDER — OMEGA-3-ACID ETHYL ESTERS 1 G PO CAPS: 2.0000 | ORAL_CAPSULE | Freq: Two times a day (BID) | ORAL | 5 refills | Status: DC

## 2019-09-30 NOTE — Progress Notes (Unsigned)
Received faxed refill request from [facility] for Omega 3 Acid 1G. Resent refill.

## 2019-10-05 DIAGNOSIS — B029 Zoster without complications: Secondary | ICD-10-CM

## 2019-10-05 HISTORY — DX: Zoster without complications: B02.9

## 2019-10-09 ENCOUNTER — Other Ambulatory Visit: Payer: Self-pay

## 2019-10-09 MED ORDER — AMLODIPINE BESYLATE 5 MG PO TABS
5.0000 mg | ORAL_TABLET | Freq: Every day | ORAL | 3 refills | Status: DC
Start: 2019-10-09 — End: 2020-01-20

## 2020-01-19 ENCOUNTER — Other Ambulatory Visit: Payer: Self-pay | Admitting: Cardiology

## 2020-01-20 NOTE — Telephone Encounter (Signed)
Refill sent to pharmacy.   

## 2020-02-06 IMAGING — DX DG CHEST 2V
2 series · 2 of 2 positions shown · non-contrast
Comparison: Chest x-ray dated 05/01/2013

CLINICAL DATA: Pt is having a heart cath in a week,nonsmoker,pre-op

EXAM:
CHEST  2 VIEW

[chest pa]
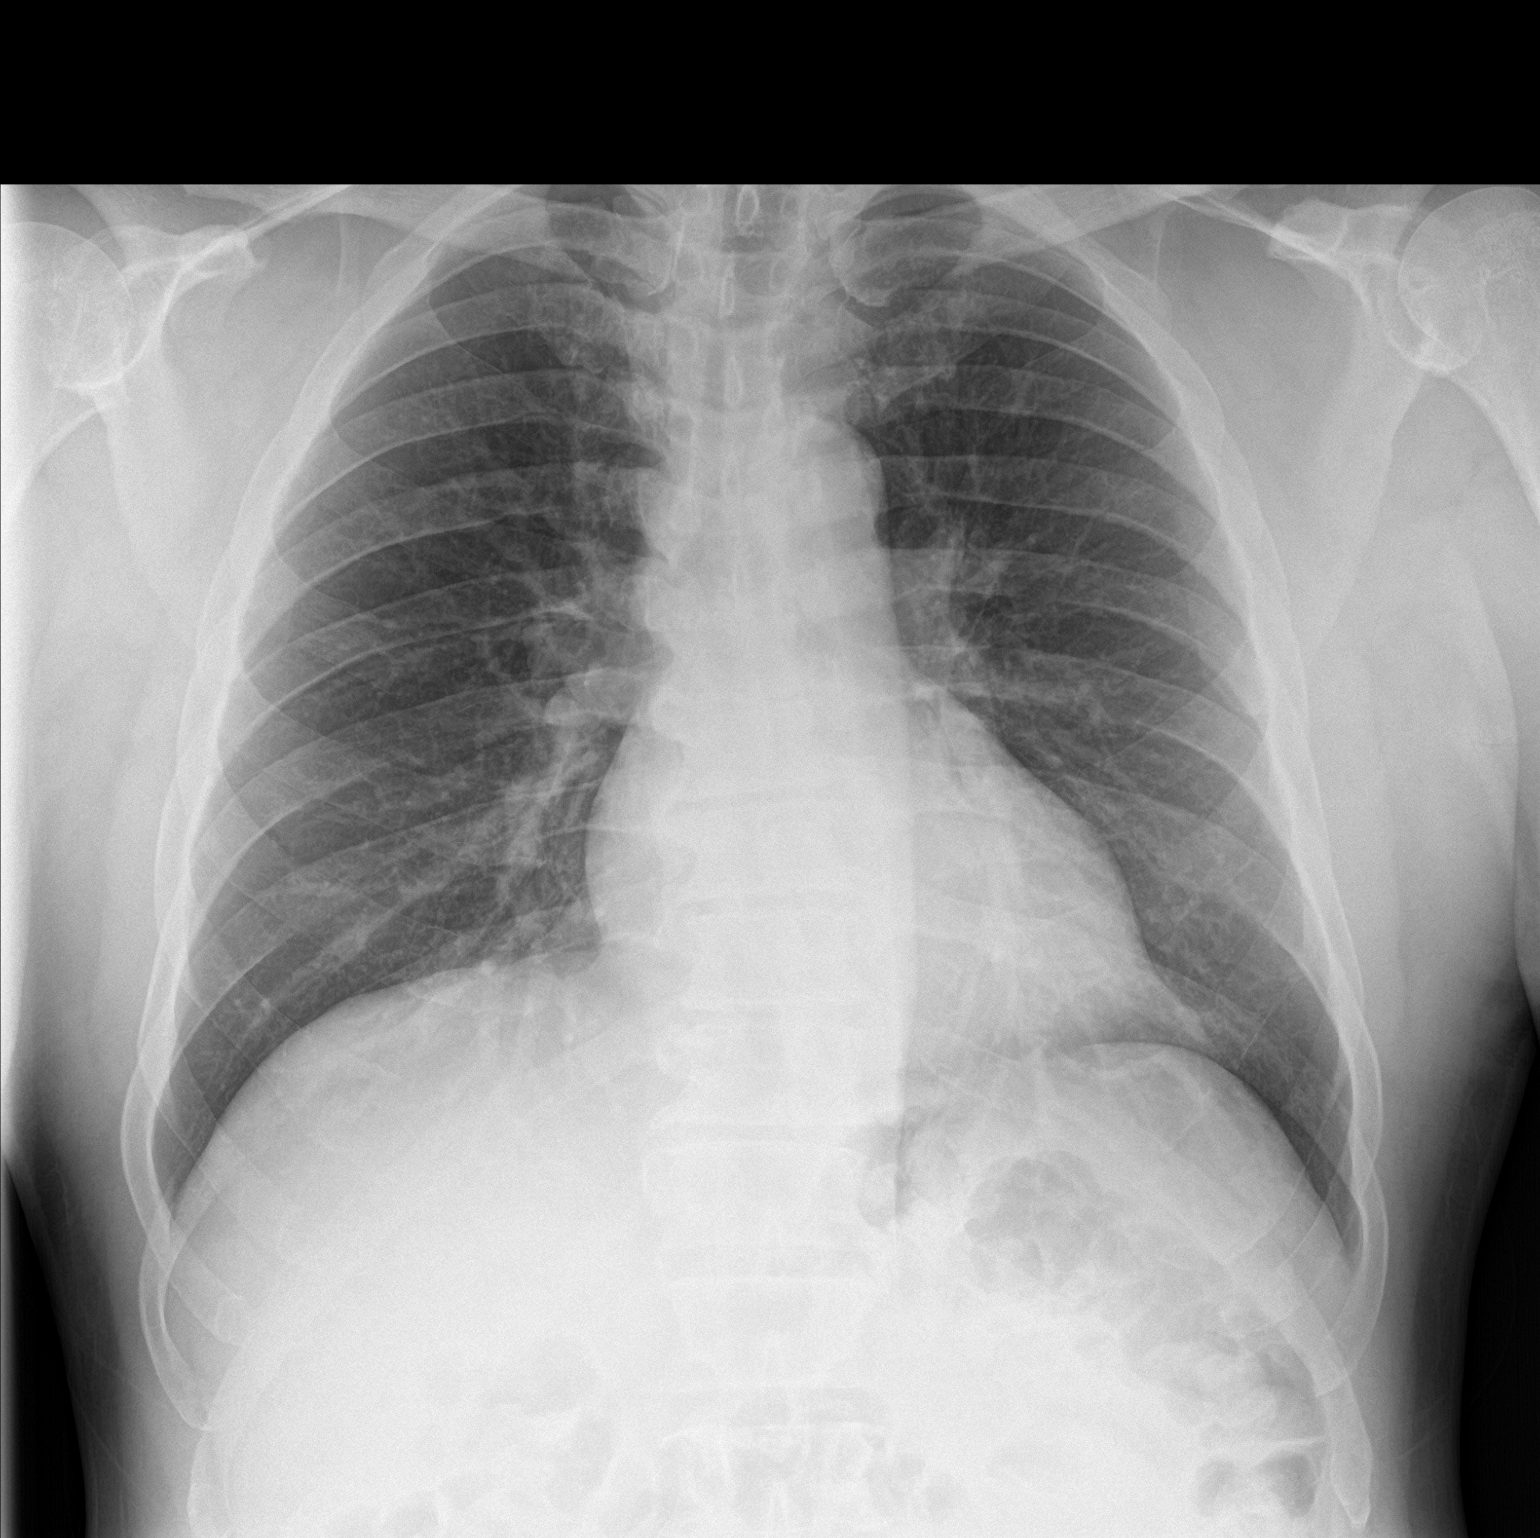

[chest lat]
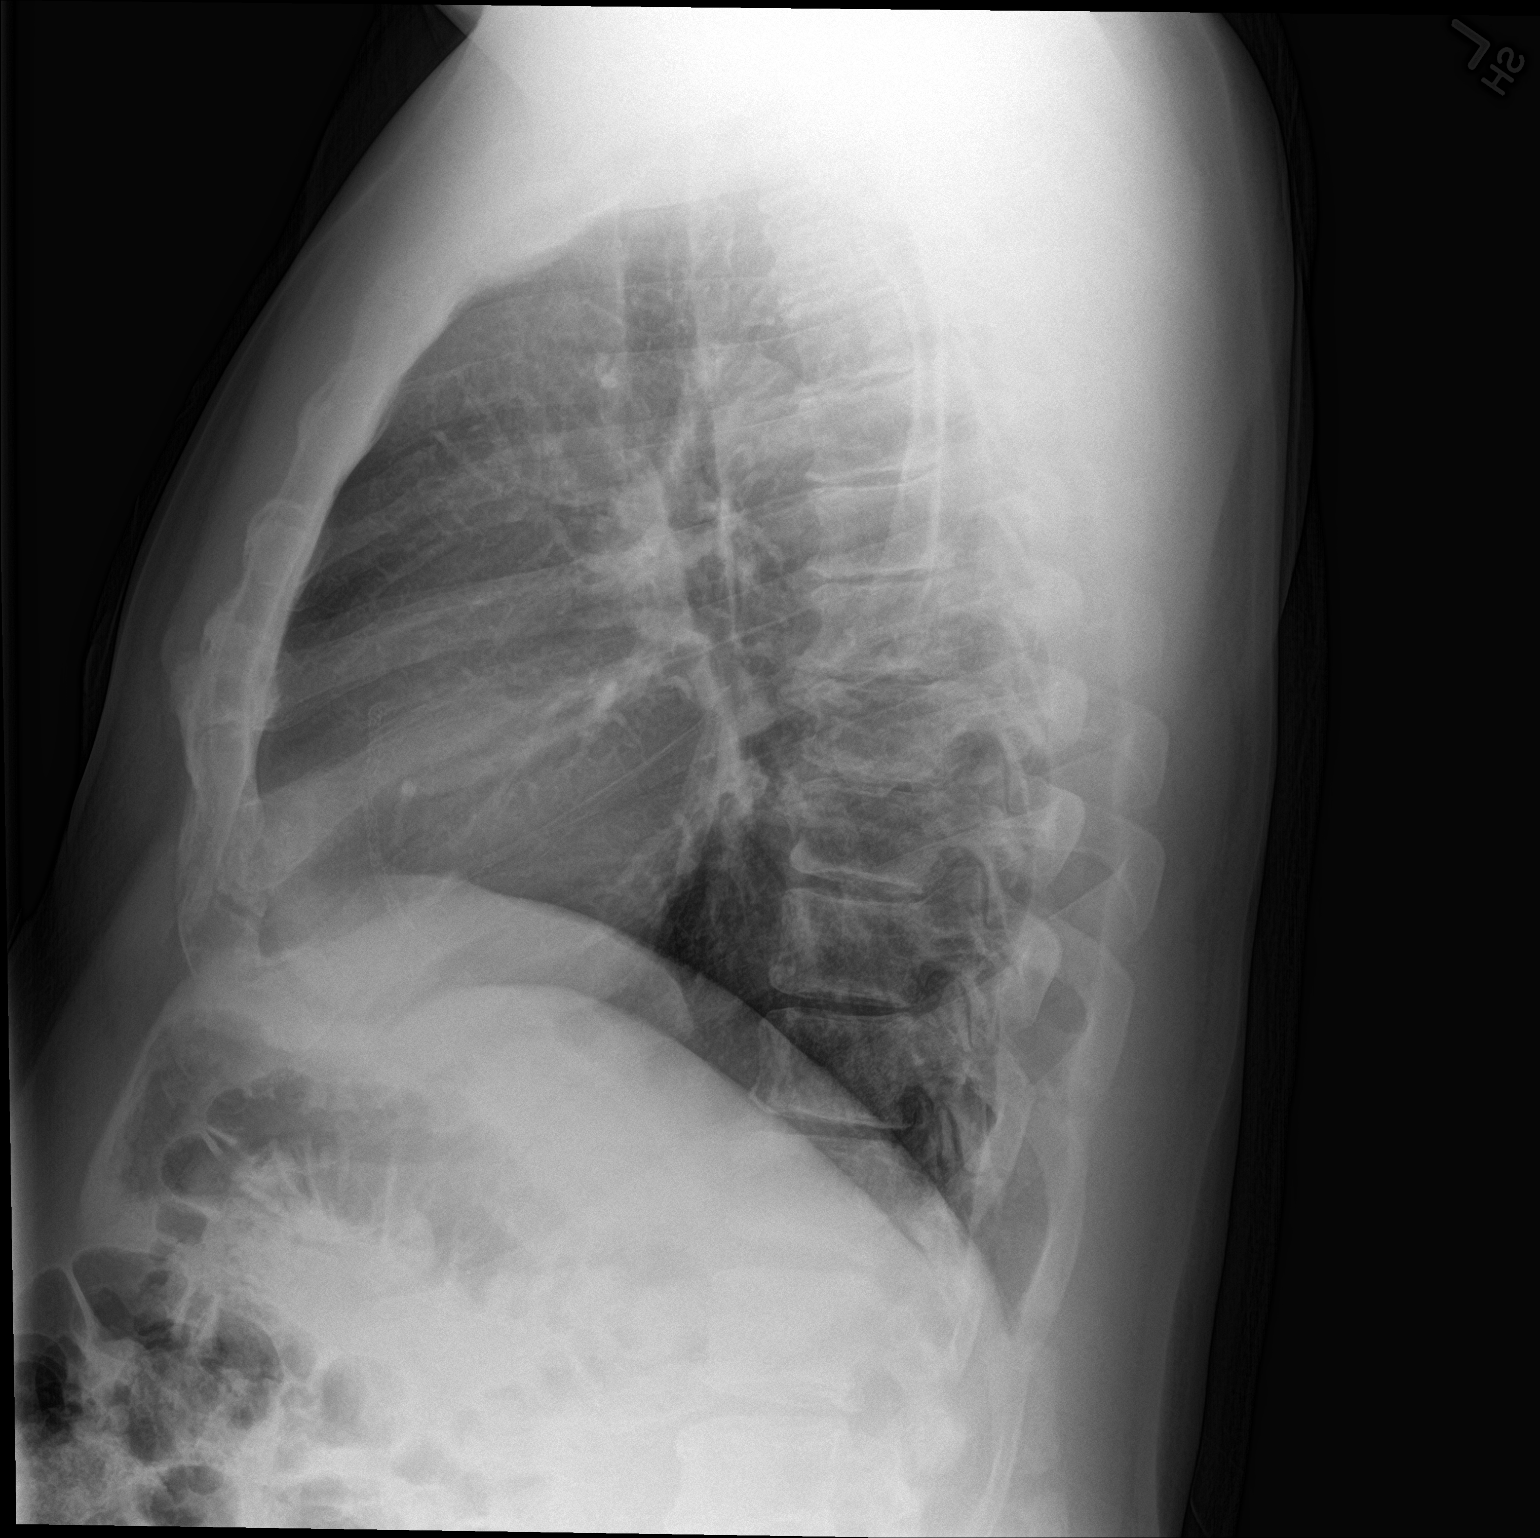

[2 of 2 positions shown; findings below may reference images not displayed]

FINDINGS: The heart size and mediastinal contours are within normal limits.
Both lungs are clear. The visualized skeletal structures are
unremarkable.
IMPRESSION: No active cardiopulmonary disease.

## 2020-02-09 ENCOUNTER — Other Ambulatory Visit: Payer: Self-pay

## 2020-02-09 DIAGNOSIS — K219 Gastro-esophageal reflux disease without esophagitis: Secondary | ICD-10-CM | POA: Insufficient documentation

## 2020-02-09 DIAGNOSIS — G4730 Sleep apnea, unspecified: Secondary | ICD-10-CM | POA: Insufficient documentation

## 2020-02-09 DIAGNOSIS — R0602 Shortness of breath: Secondary | ICD-10-CM | POA: Insufficient documentation

## 2020-02-09 DIAGNOSIS — G589 Mononeuropathy, unspecified: Secondary | ICD-10-CM | POA: Insufficient documentation

## 2020-02-09 DIAGNOSIS — T148XXA Other injury of unspecified body region, initial encounter: Secondary | ICD-10-CM | POA: Insufficient documentation

## 2020-02-09 DIAGNOSIS — I2510 Atherosclerotic heart disease of native coronary artery without angina pectoris: Secondary | ICD-10-CM | POA: Insufficient documentation

## 2020-02-09 DIAGNOSIS — E785 Hyperlipidemia, unspecified: Secondary | ICD-10-CM | POA: Insufficient documentation

## 2020-02-09 DIAGNOSIS — M1990 Unspecified osteoarthritis, unspecified site: Secondary | ICD-10-CM | POA: Insufficient documentation

## 2020-02-09 DIAGNOSIS — R42 Dizziness and giddiness: Secondary | ICD-10-CM | POA: Insufficient documentation

## 2020-02-09 DIAGNOSIS — G709 Myoneural disorder, unspecified: Secondary | ICD-10-CM | POA: Insufficient documentation

## 2020-02-09 DIAGNOSIS — I10 Essential (primary) hypertension: Secondary | ICD-10-CM | POA: Insufficient documentation

## 2020-02-11 ENCOUNTER — Other Ambulatory Visit: Payer: Self-pay

## 2020-02-11 ENCOUNTER — Encounter: Payer: Self-pay | Admitting: Cardiology

## 2020-02-11 ENCOUNTER — Ambulatory Visit: Payer: Medicare Other | Admitting: Cardiology

## 2020-02-11 VITALS — BP 120/68 | HR 62 | Ht 72.0 in | Wt 227.0 lb

## 2020-02-11 DIAGNOSIS — I10 Essential (primary) hypertension: Secondary | ICD-10-CM

## 2020-02-11 DIAGNOSIS — Z9861 Coronary angioplasty status: Secondary | ICD-10-CM

## 2020-02-11 DIAGNOSIS — Z1329 Encounter for screening for other suspected endocrine disorder: Secondary | ICD-10-CM

## 2020-02-11 DIAGNOSIS — E782 Mixed hyperlipidemia: Secondary | ICD-10-CM | POA: Diagnosis not present

## 2020-02-11 DIAGNOSIS — I4729 Other ventricular tachycardia: Secondary | ICD-10-CM

## 2020-02-11 DIAGNOSIS — I2510 Atherosclerotic heart disease of native coronary artery without angina pectoris: Secondary | ICD-10-CM

## 2020-02-11 DIAGNOSIS — Z1321 Encounter for screening for nutritional disorder: Secondary | ICD-10-CM

## 2020-02-11 DIAGNOSIS — I472 Ventricular tachycardia: Secondary | ICD-10-CM | POA: Diagnosis not present

## 2020-02-11 NOTE — Patient Instructions (Signed)
Medication Instructions:  No medication changes. *If you need a refill on your cardiac medications before your next appointment, please call your pharmacy*   Lab Work: Your physician recommends that you have labs done in the office today. Your test included  basic metabolic panel, complete blood count, TSH, vitamin D, liver function and lipids.  If you have labs (blood work) drawn today and your tests are completely normal, you will receive your results only by: MyChart Message (if you have MyChart) OR A paper copy in the mail If you have any lab test that is abnormal or we need to change your treatment, we will call you to review the results.   Testing/Procedures: None ordered   Follow-Up: At [facility], you and your health needs are our priority.  As part of our continuing mission to provide you with exceptional heart care, we have created designated Provider Care Teams.  These Care Teams include your primary Cardiologist (physician) and Advanced Practice Providers (APPs -  Physician Assistants and Nurse Practitioners) who all work together to provide you with the care you need, when you need it.  We recommend signing up for the patient portal called "MyChart".  Sign up information is provided on this After Visit Summary.  MyChart is used to connect with patients for Virtual Visits (Telemedicine).  Patients are able to view lab/test results, encounter notes, upcoming appointments, etc.  Non-urgent messages can be sent to your provider as well.   To learn more about what you can do with MyChart, go to [email or web address].    Your next appointment:   6 month(s)  The format for your next appointment:   In Person  Provider:   Rajan Revankar, MD   Other Instructions NA  

## 2020-02-11 NOTE — Progress Notes (Signed)
Cardiology Office Note:    Date:  02/11/2020   ID:  Clarence Schneider, DOB May 14, 1950, MRN [id number]  PCP:  Algis Greenhouse, MD  Cardiologist:  Jenean Lindau, MD   Referring MD: Algis Greenhouse, MD    ASSESSMENT:    1. Benign essential hypertension   2. CAD S/P percutaneous coronary angioplasty   3. Coronary artery disease involving native coronary artery of native heart without angina pectoris   4. Mixed hyperlipidemia   5. Nonsustained ventricular tachycardia ([place])   6. Mixed dyslipidemia    PLAN:    In order of problems listed above:  1. Coronary artery disease: Secondary prevention stressed with the patient.  Importance of compliance with diet medication stressed any vocalized understanding.  Patient was advised to exercise on a regular basis and walk at least half an hour a day 5 days a week.  Weight reduction was stressed.  He has gained a significant amount of weight in the past year. 2. Essential hypertension: Blood pressure stable and diet was emphasized 3. Mixed dyslipidemia lipids were reviewed.  Diet was emphasized.  He will have blood work. 4. Nonsustained ventricular tachycardia: Stable and asymptomatic.  Does not appear to have recurred. 5. Chronic dizziness for the past several years.  I told him to purchase Kardia machine with to document these episodes and concomitant EKG tracings and he agrees.  He will get in touch with [place] if there are any significant symptoms.  He knows to go to the nearest emergency room as necessary.  Patient had multiple questions which were answered to his satisfaction. 6. Patient will be seen in follow-up appointment in 6 months or earlier if the patient has any concerns    Medication Adjustments/Labs and Tests Ordered: Current medicines are reviewed at length with the patient today.  Concerns regarding medicines are outlined above.  No orders of the defined types were placed in this encounter.  No orders of the defined types were  placed in this encounter.    No chief complaint on file.    History of Present Illness:    Clarence Schneider is a 69 y.o. male.  Patient has past medical history of coronary artery disease, essential hypertension, dyslipidemia and nonsustained ventricular tachycardia.  He denies any problems at this time.  He has persistent dizziness for the past several years.  No chest pain orthopnea PND.  No palpitations no syncope.  He has gained weight.  He does not exercise on a regular basis.  He leads a sedentary lifestyle.  At the time of my evaluation, the patient is alert awake oriented and in no distress.  Past Medical History:  Diagnosis Date  . Adenomatous polyp of colon 03/20/2016   Overview:  2007  Formatting of this note might be different from the original. 2007  . Allergic rhinitis 03/19/2016  . Arthritis   . Arthritis of right knee 03/26/2013  . Back strain 07/05/2017   2019: right  . Benign essential hypertension 12/27/2014   Formatting of this note might be different from the original. 1997: dx 2002: rx  . Bladder disorder 05/21/2016  . CAD S/P percutaneous coronary angioplasty 06/21/2017  . Chronic frontal sinusitis 10/23/2017   2019  Formatting of this note might be different from the original. 2019  . Chronic lumbar pain 03/19/2016  . Chronic pain of both knees 07/12/2016  . Chronic right shoulder pain 07/12/2016  . Coronary artery disease    stents  . Coronary artery disease  involving native coronary artery of native heart 03/19/2016   Formatting of this note might be different from the original. 2005: onset sx 2005: cath diffuse disease, dissection/occlusion RCA, stent  . Dizziness   . Esophageal reflux   . Headache(784.0)   . Herpes zoster without complication 05/16/8655   Formatting of this note might be different from the original. 2021: right T7, no rash  . History of GI bleed 01/07/2018   Formatting of this note might be different from the original. 2019: melena, GI eval,  continue ASA  . Hyperlipemia   . Hypertension   . Hypotestosteronism 05/27/2015  . Impingement syndrome of left shoulder 06/03/2014  . Impotence 05/26/2015  . Lateral epicondylitis of right elbow 01/04/2016   Overview:  2017  Formatting of this note might be different from the original. 2017  . Lightheadedness    doesn't occur everyday; happens at times; once occured while walking up a incline  . Lumbar radicular syndrome 09/02/2015  . Mixed dyslipidemia 11/06/2016  . Mixed hyperlipidemia 03/19/2016   Overview:  1998: LDL 187 TG 317  Formatting of this note might be different from the original. 1998: LDL 187 TG 317  . Neuromuscular disorder (HCC)    Takes Gabapentin  . Nonsustained ventricular tachycardia ([place]) 06/14/2017  . Obstructive sleep apnea 03/19/2016   Overview:  2012: dx, AHI 69 with desaturation, BiPAP  Formatting of this note might be different from the original. Split-night sleep study 10/25/2017, AHI 63.8, CPAP 20  . OSA treated with BiPAP 03/19/2016   Overview:  2012: dx, AHI 69 with desaturation, BiPAP  Formatting of this note might be different from the original. Split-night sleep study 10/25/2017, AHI 63.8, CPAP 20  Formatting of this note might be different from the original. Titration 01/13/2020 BiPAP 25/20 Split-night sleep study 10/25/2017, AHI 63.8, CPAP 20  . Peripheral arterial disease ([place]) 03/19/2016  . Pinched nerve    in back  . Pre-diabetes 06/18/2016  . S/P arthroscopy of right shoulder 06/28/2014  . S/P total knee arthroplasty 05/11/2013  . S/P total knee replacement using cement 11/30/2013  . Screening for colon cancer 03/19/2016  . Screening for prostate cancer 03/19/2016  . Sebaceous cyst 05/26/2019   Formatting of this note might be different from the original. 2021: post neck excision  . Shortness of breath    with exertion  . Sinusitis 09/02/2015   Overview:  2018:   Overview:  2018: eth  . Skin abrasion    under both arms; uses Lotrisone cream twice a day  . Sleep  apnea    cpap doesnt use reg  . Slow heart rate 06/14/2017  . Subacromial bursitis of right shoulder joint 01/04/2016   Overview:  2017  Formatting of this note might be different from the original. 2017  . Trigeminal neuralgia 03/19/2016    Past Surgical History:  Procedure Laterality Date  . APPENDECTOMY    . CARDIAC CATHETERIZATION     X 3 stents  . CARPAL TUNNEL RELEASE Bilateral   . COLONOSCOPY W/ POLYPECTOMY    . CORONARY BALLOON ANGIOPLASTY N/A 06/21/2017   Procedure: CORONARY BALLOON ANGIOPLASTY;  Surgeon: Jettie Booze, MD;  Location: [facility];  Service: Cardiovascular;  Laterality: N/A;  . ELBOW ARTHROSCOPY Bilateral    ulner nerve   2 surgeries  . INGUINAL HERNIA REPAIR Right   . KNEE ARTHROSCOPY Bilateral    Left x 1 Right X 2  . LEFT HEART CATH AND CORONARY ANGIOGRAPHY N/A 06/21/2017  Procedure: LEFT HEART CATH AND CORONARY ANGIOGRAPHY;  Surgeon: Jettie Booze, MD;  Location: [facility];  Service: Cardiovascular;  Laterality: N/A;  . SHOULDER ARTHROSCOPY Left   . TONSILLECTOMY    . TOTAL KNEE ARTHROPLASTY Left 05/11/2013   Procedure: LEFT TOTAL KNEE ARTHROPLASTY;  Surgeon: Vickey Huger, MD;  Location: [facility];  Service: Orthopedics;  Laterality: Left;  . TOTAL KNEE ARTHROPLASTY Right 11/30/2013   dr Ronnie Derby  . TOTAL KNEE ARTHROPLASTY Right 11/30/2013   Procedure: TOTAL KNEE ARTHROPLASTY;  Surgeon: Vickey Huger, MD;  Location: [facility];  Service: Orthopedics;  Laterality: Right;    Current Medications: Current Meds  Medication Sig  . amLODipine (NORVASC) 5 MG tablet TAKE 1 TABLET BY MOUTH DAILY  . ascorbic acid (VITAMIN C) 1000 MG tablet Take 1,000 mg by mouth daily.  Marland Kitchen aspirin 81 MG EC tablet Take 81 mg by mouth daily.   . carvedilol (COREG) 12.5 MG tablet TAKE 1 TABLET BY MOUTH TWICE A DAY.  Marland Kitchen clopidogrel (PLAVIX) 75 MG tablet TAKE 1 TABLET BY MOUTH DAILY  . ezetimibe (ZETIA) 10 MG tablet TAKE 1 TABLET BY MOUTH DAILY  . fenofibrate 160 MG tablet  TAKE 1 TABLET BY MOUTH EVERY EVENING.  . fexofenadine (ALLEGRA) 180 MG tablet Take 180 mg by mouth daily.  Marland Kitchen gabapentin (NEURONTIN) 300 MG capsule Take 300 mg by mouth 3 (three) times daily as needed.  . hydrochlorothiazide (HYDRODIURIL) 12.5 MG tablet TAKE 1 TABLET BY MOUTH ONCE DAILY.  . montelukast (SINGULAIR) 10 MG tablet Take 10 mg by mouth daily.  . Multiple Vitamins-Minerals (MULTIVITAMIN WITH MINERALS) tablet Take 1 tablet by mouth daily.  Marland Kitchen omega-3 acid ethyl esters (LOVAZA) 1 g capsule Take 2 capsules (2 g total) by mouth 2 (two) times daily.  Marland Kitchen pyridoxine (B-6) 200 MG tablet Take 200 mg by mouth daily.  . rosuvastatin (CRESTOR) 40 MG tablet TAKE 1 TABLET BY MOUTH DAILY  . sildenafil (REVATIO) 20 MG tablet TAKE UP TO 5 TABLETS AS DIRECTED ONCE DAILY IF NEEDED FOR ERECTILE DYSFUNCTION.  Marland Kitchen telmisartan (MICARDIS) 80 MG tablet TAKE 1 TABLET BY MOUTH DAILY  . testosterone cypionate (DEPOTESTOSTERONE CYPIONATE) 200 MG/ML injection Inject 1.25 mLs into the muscle every 14 (fourteen) days.  Bethann Humble Sulfate (EYE DROPS RELIEF OP) Place 1 drop into both eyes 2 (two) times daily.  . vitamin B-12 (CYANOCOBALAMIN) 1000 MCG tablet Take 1,000 mcg by mouth daily.     Allergies:   Oxycontin [oxycodone hcl], Metoclopramide, and Tape   Social History   Socioeconomic History  . Marital status: Married    Spouse name: Not on file  . Number of children: Not on file  . Years of education: Not on file  . Highest education level: Not on file  Occupational History  . Not on file  Tobacco Use  . Smoking status: Never Smoker  . Smokeless tobacco: Never Used  Vaping Use  . Vaping Use: Never used  Substance and Sexual Activity  . Alcohol use: No  . Drug use: No  . Sexual activity: Not on file  Other Topics Concern  . Not on file  Social History Narrative  . Not on file   Social Determinants of Health   Financial Resource Strain:   . Difficulty of Paying Living Expenses: Not on  file  Food Insecurity:   . Worried About Charity fundraiser in the Last Year: Not on file  . Ran Out of Food in the Last Year: Not on  file  Transportation Needs:   . Film/video editor (Medical): Not on file  . Lack of Transportation (Non-Medical): Not on file  Physical Activity:   . Days of Exercise per Week: Not on file  . Minutes of Exercise per Session: Not on file  Stress:   . Feeling of Stress : Not on file  Social Connections:   . Frequency of Communication with Friends and Family: Not on file  . Frequency of Social Gatherings with Friends and Family: Not on file  . Attends Religious Services: Not on file  . Active Member of Clubs or Organizations: Not on file  . Attends Archivist Meetings: Not on file  . Marital Status: Not on file     Family History: The patient's family history includes COPD in his brother; Cancer in his brother and father; Diabetes in his father; Heart attack in his brother; Hyperlipidemia in his father; Hypertension in his father and mother; Prostate cancer in his brother.  ROS:   Please see the history of present illness.    All other systems reviewed and are negative.  EKGs/Labs/Other Studies Reviewed:    The following studies were reviewed today: I discussed my findings with the patient at extensive length   Recent Labs: 08/18/2019: ALT 32; BUN 16; Creatinine, Ser 1.69; Hemoglobin 16.7; Platelets 181; Potassium 4.3; Sodium 140; TSH 2.580  Recent Lipid Panel    Component Value Date/Time   CHOL 118 08/18/2019 0853   TRIG 137 08/18/2019 0853   HDL 33 (L) 08/18/2019 0853   CHOLHDL 3.6 08/18/2019 0853   LDLCALC 61 08/18/2019 0853    Physical Exam:    VS:  BP 120/68   Pulse 62   Ht 6' (1.829 m)   Wt 227 lb (103 kg)   SpO2 97%   BMI 30.79 kg/m     Wt Readings from Last 3 Encounters:  02/11/20 227 lb (103 kg)  08/18/19 222 lb (100.7 kg)  02/17/19 218 lb (98.9 kg)     GEN: Patient is in no acute distress HEENT:  Normal NECK: No JVD; No carotid bruits LYMPHATICS: No lymphadenopathy CARDIAC: Hear sounds regular, 2/6 systolic murmur at the apex. RESPIRATORY:  Clear to auscultation without rales, wheezing or rhonchi  ABDOMEN: Soft, non-tender, non-distended MUSCULOSKELETAL:  No edema; No deformity  SKIN: Warm and dry NEUROLOGIC:  Alert and oriented x 3 PSYCHIATRIC:  Normal affect   Signed, Jenean Lindau, MD  02/11/2020 8:30 AM    [place]

## 2020-02-12 LAB — CBC WITH DIFFERENTIAL/PLATELET
Basophils Absolute: 0 x10E3/uL (ref 0.0–0.2)
Basos: 1 %
EOS (ABSOLUTE): 0.3 x10E3/uL (ref 0.0–0.4)
Eos: 5 %
Hematocrit: 42.1 % (ref 37.5–51.0)
Hemoglobin: 14.9 g/dL (ref 13.0–17.7)
Immature Grans (Abs): 0 x10E3/uL (ref 0.0–0.1)
Immature Granulocytes: 1 %
Lymphocytes Absolute: 1.2 x10E3/uL (ref 0.7–3.1)
Lymphs: 20 %
MCH: 32.6 pg (ref 26.6–33.0)
MCHC: 35.4 g/dL (ref 31.5–35.7)
MCV: 92 fL (ref 79–97)
Monocytes Absolute: 0.8 x10E3/uL (ref 0.1–0.9)
Monocytes: 14 %
Neutrophils Absolute: 3.6 x10E3/uL (ref 1.4–7.0)
Neutrophils: 59 %
Platelets: 180 x10E3/uL (ref 150–450)
RBC: 4.57 x10E6/uL (ref 4.14–5.80)
RDW: 14.5 % (ref 11.6–15.4)
WBC: 5.9 x10E3/uL (ref 3.4–10.8)

## 2020-02-12 LAB — BASIC METABOLIC PANEL
BUN/Creatinine Ratio: 11 (ref 10–24)
BUN: 16 mg/dL (ref 8–27)
CO2: 24 mmol/L (ref 20–29)
Calcium: 9.7 mg/dL (ref 8.6–10.2)
Chloride: 102 mmol/L (ref 96–106)
Creatinine, Ser: 1.45 mg/dL — ABNORMAL HIGH (ref 0.76–1.27)
GFR calc Af Amer: 57 mL/min/{1.73_m2} — ABNORMAL LOW (ref >59)
GFR calc non Af Amer: 49 mL/min/{1.73_m2} — ABNORMAL LOW (ref >59)
Glucose: 163 mg/dL — ABNORMAL HIGH (ref 65–99)
Potassium: 4.5 mmol/L (ref 3.5–5.2)
Sodium: 140 mmol/L (ref 134–144)

## 2020-02-12 LAB — HEPATIC FUNCTION PANEL
ALT: 49 IU/L — ABNORMAL HIGH (ref 0–44)
AST: 41 IU/L — ABNORMAL HIGH (ref 0–40)
Albumin: 4.5 g/dL (ref 3.8–4.8)
Alkaline Phosphatase: 76 IU/L (ref 44–121)
Bilirubin Total: 0.4 mg/dL (ref 0.0–1.2)
Bilirubin, Direct: 0.17 mg/dL (ref 0.00–0.40)
Total Protein: 6.7 g/dL (ref 6.0–8.5)

## 2020-02-12 LAB — VITAMIN D 25 HYDROXY (VIT D DEFICIENCY, FRACTURES): Vit D, 25-Hydroxy: 38.3 ng/mL (ref 30.0–100.0)

## 2020-02-12 LAB — TSH: TSH: 2.38 u[IU]/mL (ref 0.450–4.500)

## 2020-02-12 LAB — LIPID PANEL
Chol/HDL Ratio: 3.5 ratio (ref 0.0–5.0)
Cholesterol, Total: 135 mg/dL (ref 100–199)
HDL: 39 mg/dL — ABNORMAL LOW
LDL Chol Calc (NIH): 75 mg/dL (ref 0–99)
Triglycerides: 112 mg/dL (ref 0–149)
VLDL Cholesterol Cal: 21 mg/dL (ref 5–40)

## 2020-02-16 DIAGNOSIS — M1990 Unspecified osteoarthritis, unspecified site: Secondary | ICD-10-CM | POA: Insufficient documentation

## 2020-02-16 DIAGNOSIS — G709 Myoneural disorder, unspecified: Secondary | ICD-10-CM | POA: Insufficient documentation

## 2020-02-16 DIAGNOSIS — K219 Gastro-esophageal reflux disease without esophagitis: Secondary | ICD-10-CM

## 2020-02-16 HISTORY — DX: Unspecified osteoarthritis, unspecified site: M19.90

## 2020-02-16 HISTORY — DX: Myoneural disorder, unspecified: G70.9

## 2020-02-16 HISTORY — DX: Gastro-esophageal reflux disease without esophagitis: K21.9

## 2020-02-17 ENCOUNTER — Telehealth: Payer: Self-pay | Admitting: Cardiology

## 2020-02-17 NOTE — Telephone Encounter (Signed)
Patient's wife returned call to review lab results. Please return call to patient to discuss at [phone].

## 2020-02-17 NOTE — Telephone Encounter (Signed)
I am acting in triage role today to assist with patient callbacks. Reached out to Dr. Julien Nordmann nurse Truddie Hidden to inquire how to set up f/u labs as requested. She indicates she's reached out to the patient and will try calling them again herself. Will remove from triage pool.

## 2020-02-23 ENCOUNTER — Other Ambulatory Visit: Payer: Self-pay | Admitting: Cardiology

## 2020-03-28 DIAGNOSIS — B079 Viral wart, unspecified: Secondary | ICD-10-CM | POA: Insufficient documentation

## 2020-03-28 DIAGNOSIS — N1831 Chronic kidney disease, stage 3a: Secondary | ICD-10-CM

## 2020-03-28 HISTORY — DX: Viral wart, unspecified: B07.9

## 2020-03-28 HISTORY — DX: Chronic kidney disease, stage 3a: N18.31

## 2020-04-19 ENCOUNTER — Other Ambulatory Visit: Payer: Self-pay | Admitting: Cardiology

## 2020-04-19 DIAGNOSIS — I2510 Atherosclerotic heart disease of native coronary artery without angina pectoris: Secondary | ICD-10-CM

## 2020-06-23 ENCOUNTER — Other Ambulatory Visit: Payer: Self-pay | Admitting: Cardiology

## 2020-07-21 ENCOUNTER — Other Ambulatory Visit: Payer: Self-pay | Admitting: Cardiology

## 2020-07-21 DIAGNOSIS — E782 Mixed hyperlipidemia: Secondary | ICD-10-CM

## 2020-07-21 NOTE — Telephone Encounter (Signed)
Refill sent to pharmacy.   

## 2020-08-05 ENCOUNTER — Other Ambulatory Visit: Payer: Self-pay | Admitting: Cardiology

## 2020-08-05 NOTE — Telephone Encounter (Signed)
Refill sent to pharmacy.   

## 2020-08-11 ENCOUNTER — Ambulatory Visit: Payer: Medicare Other | Admitting: Cardiology

## 2020-08-25 ENCOUNTER — Other Ambulatory Visit: Payer: Self-pay

## 2020-09-01 ENCOUNTER — Ambulatory Visit: Payer: Medicare Other | Admitting: Cardiology

## 2020-09-01 ENCOUNTER — Encounter: Payer: Self-pay | Admitting: Cardiology

## 2020-09-01 ENCOUNTER — Other Ambulatory Visit: Payer: Self-pay

## 2020-09-01 ENCOUNTER — Other Ambulatory Visit: Payer: Self-pay | Admitting: Cardiology

## 2020-09-01 VITALS — BP 128/74 | HR 66 | Ht 72.0 in | Wt 224.0 lb

## 2020-09-01 DIAGNOSIS — E782 Mixed hyperlipidemia: Secondary | ICD-10-CM | POA: Diagnosis not present

## 2020-09-01 DIAGNOSIS — I4729 Other ventricular tachycardia: Secondary | ICD-10-CM

## 2020-09-01 DIAGNOSIS — R7989 Other specified abnormal findings of blood chemistry: Secondary | ICD-10-CM

## 2020-09-01 DIAGNOSIS — Z9861 Coronary angioplasty status: Secondary | ICD-10-CM

## 2020-09-01 DIAGNOSIS — I472 Ventricular tachycardia: Secondary | ICD-10-CM | POA: Diagnosis not present

## 2020-09-01 DIAGNOSIS — I2510 Atherosclerotic heart disease of native coronary artery without angina pectoris: Secondary | ICD-10-CM | POA: Diagnosis not present

## 2020-09-01 DIAGNOSIS — I10 Essential (primary) hypertension: Secondary | ICD-10-CM | POA: Diagnosis not present

## 2020-09-01 NOTE — Progress Notes (Signed)
Cardiology Office Note:    Date:  09/01/2020   ID:  Clarence Schneider, DOB 04-08-1951, MRN [id number]  PCP:  Algis Greenhouse, MD  Cardiologist:  Jenean Lindau, MD   Referring MD: Algis Greenhouse, MD    ASSESSMENT:    1. CAD S/P percutaneous coronary angioplasty   2. Benign essential hypertension   3. Mixed hyperlipidemia   4. Nonsustained ventricular tachycardia (HCC)    PLAN:    In order of problems listed above:  1. Coronary artery disease: Secondary prevention stressed with the patient.  Importance of compliance with diet medication stressed any vocalized understanding.  He was advised to walk at least half an hour a day 5 days a week and he promises to do so. 2. Essential hypertension: Blood pressure stable and diet was emphasized lifestyle modification urged  3. Mixed dyslipidemia: Lipids were reviewed from last evaluation.  He is fasting and we will do complete blood work today diet was emphasized 4. Nonsustained ventricular tachycardia: Stable.  Optimized therapy at this time 5. Obesity: Weight reduction stressed diet emphasized risks explained and he promises to do better 6. Patient will be seen in follow-up appointment in 6 months or earlier if the patient has any concerns    Medication Adjustments/Labs and Tests Ordered: Current medicines are reviewed at length with the patient today.  Concerns regarding medicines are outlined above.  No orders of the defined types were placed in this encounter.  No orders of the defined types were placed in this encounter.    No chief complaint on file.    History of Present Illness:    Clarence Schneider is a 70 y.o. male.  Patient has past medical history of coronary artery disease, essential hypertension and dyslipidemia.  He denies any problems at this time and takes care of activities of daily living.  No chest pain orthopnea or PND.  He walks on a regular basis.  At the time of my evaluation, the patient is alert awake  oriented and in no distress.  Past Medical History:  Diagnosis Date  . Adenomatous polyp of colon 03/20/2016   Overview:  2007  Formatting of this note might be different from the original. 2007  . Allergic rhinitis 03/19/2016  . Arthritis   . Arthritis of right knee 03/26/2013  . Back strain 07/05/2017   2019: right  . Benign essential hypertension 12/27/2014   Formatting of this note might be different from the original. 1997: dx 2002: rx  . Bladder disorder 05/21/2016  . CAD S/P percutaneous coronary angioplasty 06/21/2017  . Chronic frontal sinusitis 10/23/2017   2019  Formatting of this note might be different from the original. 2019  . Chronic lumbar pain 03/19/2016  . Chronic pain of both knees 07/12/2016  . Chronic right shoulder pain 07/12/2016  . Coronary artery disease    stents  . Coronary artery disease involving native coronary artery of native heart 03/19/2016   Formatting of this note might be different from the original. 2005: onset sx 2005: cath diffuse disease, dissection/occlusion RCA, stent  . Dizziness   . Esophageal reflux   . Gastroesophageal reflux disease 02/16/2020  . Headache(784.0)   . Herpes zoster without complication 4/78/2956   Formatting of this note might be different from the original. 2021: right T7, no rash  . History of GI bleed 01/07/2018   Formatting of this note might be different from the original. 2019: melena, GI eval, continue ASA  . Hyperlipemia   .  Hypertension   . Hypotestosteronism 05/27/2015  . Impingement syndrome of left shoulder 06/03/2014  . Impotence 05/26/2015  . Lateral epicondylitis of right elbow 01/04/2016   Overview:  2017  Formatting of this note might be different from the original. 2017  . Lightheadedness    doesn't occur everyday; happens at times; once occured while walking up a incline  . Lumbar radicular syndrome 09/02/2015  . Mixed dyslipidemia 11/06/2016  . Mixed hyperlipidemia 03/19/2016   Overview:  1998: LDL 187 TG 317   Formatting of this note might be different from the original. 1998: LDL 187 TG 317  . Neuromuscular disorder (HCC)    Takes Gabapentin  . Nonsustained ventricular tachycardia ([place]) 06/14/2017  . Obstructive sleep apnea 03/19/2016   Overview:  2012: dx, AHI 69 with desaturation, BiPAP  Formatting of this note might be different from the original. Split-night sleep study 10/25/2017, AHI 63.8, CPAP 20  . OSA treated with BiPAP 03/19/2016   Overview:  2012: dx, AHI 69 with desaturation, BiPAP  Formatting of this note might be different from the original. Split-night sleep study 10/25/2017, AHI 63.8, CPAP 20  Formatting of this note might be different from the original. Titration 01/13/2020 BiPAP 25/20 Split-night sleep study 10/25/2017, AHI 63.8, CPAP 20  . Peripheral arterial disease ([place]) 03/19/2016  . Pinched nerve    in back  . Pre-diabetes 06/18/2016  . S/P arthroscopy of right shoulder 06/28/2014  . S/P total knee arthroplasty 05/11/2013  . S/P total knee replacement using cement 11/30/2013  . Screening for colon cancer 03/19/2016  . Screening for prostate cancer 03/19/2016  . Sebaceous cyst 05/26/2019   Formatting of this note might be different from the original. 2021: post neck excision  . Shortness of breath    with exertion  . Sinusitis 09/02/2015   Overview:  2018:   Overview:  2018: eth  . Sinusitis, acute ethmoidal 09/02/2015   Formatting of this note might be different from the original. 2018: eth 03/28/2020  . Skin abrasion    under both arms; uses Lotrisone cream twice a day  . Sleep apnea    cpap doesnt use reg  . Slow heart rate 06/14/2017  . Stage 3a chronic kidney disease ([place]) 03/28/2020  . Subacromial bursitis of right shoulder joint 01/04/2016   Overview:  2017  Formatting of this note might be different from the original. 2017  . Trigeminal neuralgia 03/19/2016  . Type 2 diabetes mellitus without complication, without long-term current use of insulin ([place]) 06/18/2016   Formatting of  this note might be different from the original. 06/23/2020 : 132/6.9  . Verrucous skin lesion 03/28/2020   Formatting of this note might be different from the original. 03/28/2020 : right deltoid region    Past Surgical History:  Procedure Laterality Date  . APPENDECTOMY    . CARDIAC CATHETERIZATION     X 3 stents  . CARPAL TUNNEL RELEASE Bilateral   . COLONOSCOPY W/ POLYPECTOMY    . CORONARY BALLOON ANGIOPLASTY N/A 06/21/2017   Procedure: CORONARY BALLOON ANGIOPLASTY;  Surgeon: Jettie Booze, MD;  Location: [facility];  Service: Cardiovascular;  Laterality: N/A;  . ELBOW ARTHROSCOPY Bilateral    ulner nerve   2 surgeries  . INGUINAL HERNIA REPAIR Right   . KNEE ARTHROSCOPY Bilateral    Left x 1 Right X 2  . LEFT HEART CATH AND CORONARY ANGIOGRAPHY N/A 06/21/2017   Procedure: LEFT HEART CATH AND CORONARY ANGIOGRAPHY;  Surgeon: Jettie Booze,  MD;  Location: [facility];  Service: Cardiovascular;  Laterality: N/A;  . SHOULDER ARTHROSCOPY Left   . TONSILLECTOMY    . TOTAL KNEE ARTHROPLASTY Left 05/11/2013   Procedure: LEFT TOTAL KNEE ARTHROPLASTY;  Surgeon: Vickey Huger, MD;  Location: [facility];  Service: Orthopedics;  Laterality: Left;  . TOTAL KNEE ARTHROPLASTY Right 11/30/2013   dr Ronnie Derby  . TOTAL KNEE ARTHROPLASTY Right 11/30/2013   Procedure: TOTAL KNEE ARTHROPLASTY;  Surgeon: Vickey Huger, MD;  Location: [facility];  Service: Orthopedics;  Laterality: Right;    Current Medications: Current Meds  Medication Sig  . amLODipine (NORVASC) 5 MG tablet Take 5 mg by mouth daily.  Marland Kitchen ascorbic acid (VITAMIN C) 1000 MG tablet Take 1,000 mg by mouth daily.  Marland Kitchen aspirin 81 MG EC tablet Take 81 mg by mouth daily.   . carvedilol (COREG) 12.5 MG tablet Take 12.5 mg by mouth 2 (two) times daily with a meal.  . clopidogrel (PLAVIX) 75 MG tablet Take 75 mg by mouth daily.  Marland Kitchen ezetimibe (ZETIA) 10 MG tablet Take 10 mg by mouth daily.  . fenofibrate 160 MG tablet Take 160 mg by mouth  daily.  . fexofenadine (ALLEGRA) 180 MG tablet Take 180 mg by mouth daily.  Marland Kitchen gabapentin (NEURONTIN) 300 MG capsule Take 300 mg by mouth 3 (three) times daily as needed for pain.  . hydrochlorothiazide (HYDRODIURIL) 12.5 MG tablet Take 12.5 mg by mouth daily.  . montelukast (SINGULAIR) 10 MG tablet Take 10 mg by mouth daily.  . Multiple Vitamins-Minerals (MULTIVITAMIN WITH MINERALS) tablet Take 1 tablet by mouth daily.  . nitroGLYCERIN (NITROSTAT) 0.4 MG SL tablet Place 0.4 mg under the tongue every 5 (five) minutes as needed for chest pain.  Marland Kitchen omega-3 acid ethyl esters (LOVAZA) 1 g capsule Take 2 g by mouth 2 (two) times daily.  Marland Kitchen pyridoxine (B-6) 200 MG tablet Take 200 mg by mouth daily.  . rosuvastatin (CRESTOR) 40 MG tablet Take 40 mg by mouth daily.  . sildenafil (REVATIO) 20 MG tablet Take 20 mg by mouth as needed for erectile dysfunction.  Marland Kitchen telmisartan (MICARDIS) 80 MG tablet Take 80 mg by mouth daily.  Marland Kitchen testosterone cypionate (DEPOTESTOSTERONE CYPIONATE) 200 MG/ML injection Inject 1.25 mLs into the muscle every 14 (fourteen) days.  Bethann Humble Sulfate (EYE DROPS RELIEF OP) Place 1 drop into both eyes 2 (two) times daily.  . vitamin B-12 (CYANOCOBALAMIN) 1000 MCG tablet Take 1,000 mcg by mouth daily.     Allergies:   Oxycontin [oxycodone hcl], Metoclopramide, and Tape   Social History   Socioeconomic History  . Marital status: Married    Spouse name: Not on file  . Number of children: Not on file  . Years of education: Not on file  . Highest education level: Not on file  Occupational History  . Not on file  Tobacco Use  . Smoking status: Never Smoker  . Smokeless tobacco: Never Used  Vaping Use  . Vaping Use: Never used  Substance and Sexual Activity  . Alcohol use: No  . Drug use: No  . Sexual activity: Not on file  Other Topics Concern  . Not on file  Social History Narrative  . Not on file   Social Determinants of Health   Financial Resource Strain:  Not on file  Food Insecurity: Not on file  Transportation Needs: Not on file  Physical Activity: Not on file  Stress: Not on file  Social Connections: Not on file  Family History: The patient's family history includes COPD in his brother; Cancer in his brother and father; Diabetes in his father; Heart attack in his brother; Hyperlipidemia in his father; Hypertension in his father and mother; Prostate cancer in his brother.  ROS:   Please see the history of present illness.    All other systems reviewed and are negative.  EKGs/Labs/Other Studies Reviewed:    The following studies were reviewed today: EKG reveals sinus rhythm and nonspecific ST-T changes   Recent Labs: 02/11/2020: ALT 49; BUN 16; Creatinine, Ser 1.45; Hemoglobin 14.9; Platelets 180; Potassium 4.5; Sodium 140; TSH 2.380  Recent Lipid Panel    Component Value Date/Time   CHOL 135 02/11/2020 0845   TRIG 112 02/11/2020 0845   HDL 39 (L) 02/11/2020 0845   CHOLHDL 3.5 02/11/2020 0845   LDLCALC 75 02/11/2020 0845    Physical Exam:    VS:  BP 128/74   Pulse 66   Ht 6' (1.829 m)   Wt 224 lb (101.6 kg)   SpO2 95%   BMI 30.38 kg/m     Wt Readings from Last 3 Encounters:  09/01/20 224 lb (101.6 kg)  02/11/20 227 lb (103 kg)  08/18/19 222 lb (100.7 kg)     GEN: Patient is in no acute distress HEENT: Normal NECK: No JVD; No carotid bruits LYMPHATICS: No lymphadenopathy CARDIAC: Hear sounds regular, 2/6 systolic murmur at the apex. RESPIRATORY:  Clear to auscultation without rales, wheezing or rhonchi  ABDOMEN: Soft, non-tender, non-distended MUSCULOSKELETAL:  No edema; No deformity  SKIN: Warm and dry NEUROLOGIC:  Alert and oriented x 3 PSYCHIATRIC:  Normal affect   Signed, Jenean Lindau, MD  09/01/2020 8:08 AM    [place]

## 2020-09-01 NOTE — Patient Instructions (Signed)

## 2020-09-02 LAB — LIPID PANEL
Chol/HDL Ratio: 3.8 ratio (ref 0.0–5.0)
Cholesterol, Total: 120 mg/dL (ref 100–199)
HDL: 32 mg/dL — ABNORMAL LOW (ref >39)
LDL Chol Calc (NIH): 69 mg/dL (ref 0–99)
Triglycerides: 100 mg/dL (ref 0–149)
VLDL Cholesterol Cal: 19 mg/dL (ref 5–40)

## 2020-09-02 LAB — CBC WITH DIFFERENTIAL/PLATELET
Basophils Absolute: 0.1 10*3/uL (ref 0.0–0.2)
Basos: 1 %
EOS (ABSOLUTE): 0.2 10*3/uL (ref 0.0–0.4)
Eos: 3 %
Hematocrit: 49.4 % (ref 37.5–51.0)
Hemoglobin: 16.6 g/dL (ref 13.0–17.7)
Immature Grans (Abs): 0 10*3/uL (ref 0.0–0.1)
Immature Granulocytes: 1 %
Lymphocytes Absolute: 1.2 10*3/uL (ref 0.7–3.1)
Lymphs: 27 %
MCH: 30.4 pg (ref 26.6–33.0)
MCHC: 33.6 g/dL (ref 31.5–35.7)
MCV: 91 fL (ref 79–97)
Monocytes Absolute: 0.6 10*3/uL (ref 0.1–0.9)
Monocytes: 13 %
Neutrophils Absolute: 2.4 10*3/uL (ref 1.4–7.0)
Neutrophils: 55 %
Platelets: 201 10*3/uL (ref 150–450)
RBC: 5.46 x10E6/uL (ref 4.14–5.80)
RDW: 14.2 % (ref 11.6–15.4)
WBC: 4.4 10*3/uL (ref 3.4–10.8)

## 2020-09-02 LAB — HEPATIC FUNCTION PANEL
ALT: 32 IU/L (ref 0–44)
AST: 39 IU/L (ref 0–40)
Albumin: 4.6 g/dL (ref 3.8–4.8)
Alkaline Phosphatase: 73 IU/L (ref 44–121)
Bilirubin Total: 0.6 mg/dL (ref 0.0–1.2)
Bilirubin, Direct: 0.21 mg/dL (ref 0.00–0.40)
Total Protein: 6.9 g/dL (ref 6.0–8.5)

## 2020-09-02 LAB — BASIC METABOLIC PANEL
BUN/Creatinine Ratio: 14 (ref 10–24)
BUN: 21 mg/dL (ref 8–27)
CO2: 25 mmol/L (ref 20–29)
Calcium: 9.6 mg/dL (ref 8.6–10.2)
Chloride: 98 mmol/L (ref 96–106)
Creatinine, Ser: 1.54 mg/dL — ABNORMAL HIGH (ref 0.76–1.27)
Glucose: 140 mg/dL — ABNORMAL HIGH (ref 65–99)
Potassium: 4.4 mmol/L (ref 3.5–5.2)
Sodium: 139 mmol/L (ref 134–144)
eGFR: 49 mL/min/{1.73_m2} — ABNORMAL LOW (ref >59)

## 2020-09-02 LAB — TSH: TSH: 3.5 u[IU]/mL (ref 0.450–4.500)

## 2020-09-02 NOTE — Addendum Note (Signed)
Addended by: Truddie Hidden on: 09/02/2020 12:32 PM   Modules accepted: Orders

## 2020-11-15 ENCOUNTER — Other Ambulatory Visit: Payer: Self-pay | Admitting: Cardiology

## 2020-12-07 ENCOUNTER — Other Ambulatory Visit: Payer: Self-pay | Admitting: Cardiology

## 2021-01-04 ENCOUNTER — Other Ambulatory Visit: Payer: Self-pay | Admitting: Cardiology

## 2021-01-13 ENCOUNTER — Other Ambulatory Visit: Payer: Self-pay | Admitting: Cardiology

## 2021-01-31 ENCOUNTER — Other Ambulatory Visit: Payer: Self-pay | Admitting: Cardiology

## 2021-03-14 ENCOUNTER — Ambulatory Visit: Payer: Medicare Other | Admitting: Cardiology

## 2021-03-14 ENCOUNTER — Other Ambulatory Visit: Payer: Self-pay

## 2021-03-14 ENCOUNTER — Encounter: Payer: Self-pay | Admitting: Cardiology

## 2021-03-14 ENCOUNTER — Other Ambulatory Visit: Payer: Self-pay | Admitting: Cardiology

## 2021-03-14 VITALS — BP 122/68 | HR 62 | Ht 72.0 in | Wt 229.2 lb

## 2021-03-14 DIAGNOSIS — Z9861 Coronary angioplasty status: Secondary | ICD-10-CM

## 2021-03-14 DIAGNOSIS — E119 Type 2 diabetes mellitus without complications: Secondary | ICD-10-CM

## 2021-03-14 DIAGNOSIS — I10 Essential (primary) hypertension: Secondary | ICD-10-CM | POA: Diagnosis not present

## 2021-03-14 DIAGNOSIS — I2510 Atherosclerotic heart disease of native coronary artery without angina pectoris: Secondary | ICD-10-CM

## 2021-03-14 DIAGNOSIS — E782 Mixed hyperlipidemia: Secondary | ICD-10-CM

## 2021-03-14 NOTE — Patient Instructions (Signed)

## 2021-03-14 NOTE — Progress Notes (Signed)
Cardiology Office Note:    Date:  03/14/2021   ID:  Clarence Schneider, DOB July 07, 1950, MRN [id number]  PCP:  Algis Greenhouse, MD  Cardiologist:  Jenean Lindau, MD   Referring MD: Algis Greenhouse, MD    ASSESSMENT:    No diagnosis found. PLAN:    In order of problems listed above:  Coronary artery disease: Secondary prevention stressed with the patient.  Importance of compliance with diet medication stressed and he vocalized understanding.  He was advised to walk at least half an hour a day 5 days a week and he promises to do so. Essential hypertension: Blood pressure stable and diet was emphasized.  Lifestyle modification urged. Mixed dyslipidemia: Lipids were reviewed and is fasting and will have blood work today. Elevated LFTs: Last LFTs are fine and I reassured the patient about this and we will revisit them today. Patient will be seen in follow-up appointment in 6 months or earlier if the patient has any concerns.   Medication Adjustments/Labs and Tests Ordered: Current medicines are reviewed at length with the patient today.  Concerns regarding medicines are outlined above.  No orders of the defined types were placed in this encounter.  No orders of the defined types were placed in this encounter.    No chief complaint on file.    History of Present Illness:    Clarence Schneider is a 70 y.o. male.  Patient has past medical history of coronary artery disease, essential hypertension, dyslipidemia and history of elevated LFTs.  He denies any problems at this time and takes care of activities of daily living.  No chest pain orthopnea or PND.  At the time of my evaluation, the patient is alert awake oriented and in no distress.  Past Medical History:  Diagnosis Date   Acute non-recurrent sinusitis 09/02/2015   Formatting of this note might be different from the original. 2018: eth 03/28/2020   Adenomatous polyp of colon 03/20/2016   Overview:  2007  Formatting of this  note might be different from the original. 2007   Allergic rhinitis 03/19/2016   Arthritis    Arthritis of right knee 03/26/2013   Back strain 07/05/2017   2019: right   Benign essential hypertension 12/27/2014   Formatting of this note might be different from the original. 1997: dx 2002: rx   Bladder disorder 05/21/2016   CAD S/P percutaneous coronary angioplasty 06/21/2017   Chronic frontal sinusitis 10/23/2017   2019  Formatting of this note might be different from the original. 2019   Chronic lumbar pain 03/19/2016   Chronic pain of both knees 07/12/2016   Chronic right shoulder pain 07/12/2016   Coronary artery disease    stents   Coronary artery disease involving native coronary artery of native heart 03/19/2016   Formatting of this note might be different from the original. 2005: onset sx 2005: cath diffuse disease, dissection/occlusion RCA, stent   Dizziness    Gastroesophageal reflux disease 02/16/2020   Herpes zoster without complication 72/53/6644   Formatting of this note might be different from the original. 2021: right T7, no rash   History of GI bleed 01/07/2018   Formatting of this note might be different from the original. 2019: melena, GI eval, continue ASA   Hyperlipemia    Hypertension    Hypotestosteronism 05/27/2015   Impingement syndrome of left shoulder 06/03/2014   Impotence 05/26/2015   Lateral epicondylitis of right elbow 01/04/2016   Overview:  2017  Formatting  of this note might be different from the original. 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 1998: LDL 187 TG 317   Neuromuscular disorder (HCC)    Takes Gabapentin   Nonsustained ventricular  tachycardia 06/14/2017   Obstructive sleep apnea 03/19/2016   Overview:  2012: dx, AHI 69 with desaturation, BiPAP  Formatting of this note might be different from the original. Split-night sleep study 10/25/2017, AHI 63.8, CPAP 20   OSA treated with BiPAP 03/19/2016   Overview:  2012: dx, AHI 69 with desaturation, BiPAP  Formatting of this note might be different from the original. Split-night sleep study 10/25/2017, AHI 63.8, CPAP 20  Formatting of this note might be different from the original. Titration 01/13/2020 BiPAP 25/20 Split-night sleep study 10/25/2017, AHI 63.8, CPAP 20   Peripheral arterial disease ([place]) 03/19/2016   Pinched nerve    in back   Pre-diabetes 06/18/2016   S/P arthroscopy of right shoulder 06/28/2014   S/P total knee arthroplasty 05/11/2013   S/P total knee replacement using cement 11/30/2013   Screening for colon cancer 03/19/2016   Screening for prostate cancer 03/19/2016   Sebaceous cyst 05/26/2019   Formatting of this note might be different from the original. 2021: post neck excision   Shortness of breath    with exertion   Sinusitis 09/02/2015   Overview:  2018:   Overview:  2018: eth   Sinusitis, acute ethmoidal 09/02/2015   Formatting of this note might be different from the original. 2018: eth 03/28/2020   Skin abrasion    under both arms; uses Lotrisone cream twice a day   Sleep apnea    cpap doesnt use reg   Slow heart rate 06/14/2017   Stage 3a chronic kidney disease ([place]) 03/28/2020   Subacromial bursitis of right shoulder joint 01/04/2016   Overview:  2017  Formatting of this note might be different from the original. 2017   Trigeminal neuralgia 03/19/2016   Type 2 diabetes mellitus without complication, without long-term current use of insulin ([place]) 06/18/2016   Formatting of this note might be different from the original. 06/23/2020 : 132/6.9   Verrucous skin lesion 03/28/2020   Formatting of this note might be different from the original.  03/28/2020 : right deltoid region    Past Surgical History:  Procedure Laterality Date   APPENDECTOMY     CARDIAC CATHETERIZATION     X 3 stents   CARPAL TUNNEL RELEASE Bilateral    COLONOSCOPY W/ POLYPECTOMY     CORONARY BALLOON ANGIOPLASTY N/A 06/21/2017   Procedure: CORONARY BALLOON ANGIOPLASTY;  Surgeon: Jettie Booze, MD;  Location: [facility];  Service: Cardiovascular;  Laterality: N/A;   ELBOW ARTHROSCOPY Bilateral    ulner nerve   2 surgeries   INGUINAL HERNIA REPAIR Right    KNEE ARTHROSCOPY Bilateral    Left x 1 Right X 2   LEFT HEART CATH AND CORONARY ANGIOGRAPHY N/A 06/21/2017   Procedure: LEFT HEART CATH AND CORONARY ANGIOGRAPHY;  Surgeon: Jettie Booze, MD;  Location: [facility];  Service: Cardiovascular;  Laterality: N/A;   SHOULDER ARTHROSCOPY Left    TONSILLECTOMY     TOTAL KNEE ARTHROPLASTY  Left 05/11/2013   Procedure: LEFT TOTAL KNEE ARTHROPLASTY;  Surgeon: Vickey Huger, MD;  Location: [facility];  Service: Orthopedics;  Laterality: Left;   TOTAL KNEE ARTHROPLASTY Right 11/30/2013   dr Ronnie Derby   TOTAL KNEE ARTHROPLASTY Right 11/30/2013   Procedure: TOTAL KNEE ARTHROPLASTY;  Surgeon: Vickey Huger, MD;  Location: [facility];  Service: Orthopedics;  Laterality: Right;    Current Medications: Current Meds  Medication Sig   amLODipine (NORVASC) 5 MG tablet TAKE 1 TABLET BY MOUTH DAILY   ascorbic acid (VITAMIN C) 1000 MG tablet Take 1,000 mg by mouth daily.   aspirin 81 MG EC tablet Take 81 mg by mouth daily.    carvedilol (COREG) 12.5 MG tablet TAKE 1 TABLET BY MOUTH TWICE A DAY.   clopidogrel (PLAVIX) 75 MG tablet TAKE 1 TABLET BY MOUTH DAILY   cycloSPORINE (RESTASIS) 0.05 % ophthalmic emulsion Place 1 drop into both eyes daily.   ezetimibe (ZETIA) 10 MG tablet TAKE 1 TABLET BY MOUTH DAILY   fenofibrate 160 MG tablet TAKE 1 TABLET BY MOUTH EVERY EVENING.   fexofenadine (ALLEGRA) 180 MG tablet Take 180 mg by mouth daily.   gabapentin (NEURONTIN) 300 MG  capsule Take 300 mg by mouth 3 (three) times daily as needed for pain.   hydrochlorothiazide (HYDRODIURIL) 12.5 MG tablet TAKE 1 TABLET BY MOUTH ONCE DAILY.   montelukast (SINGULAIR) 10 MG tablet Take 10 mg by mouth daily.   Multiple Vitamins-Minerals (MULTIVITAMIN WITH MINERALS) tablet Take 1 tablet by mouth daily.   nitroGLYCERIN (NITROSTAT) 0.4 MG SL tablet Place 0.4 mg under the tongue every 5 (five) minutes as needed for chest pain.   omega-3 acid ethyl esters (LOVAZA) 1 g capsule TAKE 2 CAPSULE BY MOUTH TWICE A DAY.   pyridoxine (B-6) 200 MG tablet Take 200 mg by mouth daily.   rosuvastatin (CRESTOR) 40 MG tablet TAKE 1 TABLET BY MOUTH DAILY   sildenafil (REVATIO) 20 MG tablet Take 20 mg by mouth as needed for erectile dysfunction.   telmisartan (MICARDIS) 80 MG tablet Take 80 mg by mouth daily.   testosterone cypionate (DEPOTESTOSTERONE CYPIONATE) 200 MG/ML injection Inject 1.25 mLs into the muscle every 14 (fourteen) days.   Tetrahydrozoline-Zn Sulfate (EYE DROPS RELIEF OP) Place 1 drop into both eyes 2 (two) times daily.   triamcinolone (NASACORT ALLERGY 24HR) 55 MCG/ACT AERO nasal inhaler Place 2 sprays into the nose daily.   vitamin B-12 (CYANOCOBALAMIN) 1000 MCG tablet Take 1,000 mcg by mouth daily.     Allergies:   Oxycontin [oxycodone hcl], Metoclopramide, and Tape   Social History   Socioeconomic History   Marital status: Married    Spouse name: Not on file   Number of children: Not on file   Years of education: Not on file   Highest education level: Not on file  Occupational History   Not on file  Tobacco Use   Smoking status: Never   Smokeless tobacco: Never  Vaping Use   Vaping Use: Never used  Substance and Sexual Activity   Alcohol use: No   Drug use: No   Sexual activity: Not on file  Other Topics Concern   Not on file  Social History Narrative   Not on file   Social Determinants of Health   Financial Resource Strain: Not on file  Food Insecurity: Not  on file  Transportation Needs: Not on file  Physical Activity: Not on file  Stress: Not on file  Social Connections: Not on file  Family History: The patient's family history includes COPD in his brother; Cancer in his brother and father; Diabetes in his father; Heart attack in his brother; Hyperlipidemia in his father; Hypertension in his father and mother; Prostate cancer in his brother.  ROS:   Please see the history of present illness.    All other systems reviewed and are negative.  EKGs/Labs/Other Studies Reviewed:    The following studies were reviewed today: I discussed my findings with the patient at length.   Recent Labs: 09/01/2020: ALT 32; BUN 21; Creatinine, Ser 1.54; Hemoglobin 16.6; Platelets 201; Potassium 4.4; Sodium 139; TSH 3.500  Recent Lipid Panel    Component Value Date/Time   CHOL 120 09/01/2020 0850   TRIG 100 09/01/2020 0850   HDL 32 (L) 09/01/2020 0850   CHOLHDL 3.8 09/01/2020 0850   LDLCALC 69 09/01/2020 0850    Physical Exam:    VS:  BP 122/68   Pulse 62   Ht 6' (1.829 m)   Wt 229 lb 3.2 oz (104 kg)   SpO2 96%   BMI 31.09 kg/m     Wt Readings from Last 3 Encounters:  03/14/21 229 lb 3.2 oz (104 kg)  09/01/20 224 lb (101.6 kg)  02/11/20 227 lb (103 kg)     GEN: Patient is in no acute distress HEENT: Normal NECK: No JVD; No carotid bruits LYMPHATICS: No lymphadenopathy CARDIAC: Hear sounds regular, 2/6 systolic murmur at the apex. RESPIRATORY:  Clear to auscultation without rales, wheezing or rhonchi  ABDOMEN: Soft, non-tender, non-distended MUSCULOSKELETAL:  No edema; No deformity  SKIN: Warm and dry NEUROLOGIC:  Alert and oriented x 3 PSYCHIATRIC:  Normal affect   Signed, Jenean Lindau, MD  03/14/2021 9:00 AM    [facility]

## 2021-03-15 LAB — CBC WITH DIFFERENTIAL/PLATELET
Basophils Absolute: 0 10*3/uL (ref 0.0–0.2)
Basos: 1 %
EOS (ABSOLUTE): 0.2 10*3/uL (ref 0.0–0.4)
Eos: 3 %
Hematocrit: 46.9 % (ref 37.5–51.0)
Hemoglobin: 15.6 g/dL (ref 13.0–17.7)
Immature Grans (Abs): 0 10*3/uL (ref 0.0–0.1)
Immature Granulocytes: 0 %
Lymphocytes Absolute: 1.2 10*3/uL (ref 0.7–3.1)
Lymphs: 23 %
MCH: 30.3 pg (ref 26.6–33.0)
MCHC: 33.3 g/dL (ref 31.5–35.7)
MCV: 91 fL (ref 79–97)
Monocytes Absolute: 0.6 10*3/uL (ref 0.1–0.9)
Monocytes: 12 %
Neutrophils Absolute: 3.2 10*3/uL (ref 1.4–7.0)
Neutrophils: 61 %
Platelets: 173 10*3/uL (ref 150–450)
RBC: 5.15 x10E6/uL (ref 4.14–5.80)
RDW: 13.1 % (ref 11.6–15.4)
WBC: 5.2 10*3/uL (ref 3.4–10.8)

## 2021-03-15 LAB — BASIC METABOLIC PANEL
BUN/Creatinine Ratio: 12 (ref 10–24)
BUN: 19 mg/dL (ref 8–27)
CO2: 25 mmol/L (ref 20–29)
Calcium: 9.5 mg/dL (ref 8.6–10.2)
Chloride: 101 mmol/L (ref 96–106)
Creatinine, Ser: 1.54 mg/dL — ABNORMAL HIGH (ref 0.76–1.27)
Glucose: 208 mg/dL — ABNORMAL HIGH (ref 70–99)
Potassium: 4.3 mmol/L (ref 3.5–5.2)
Sodium: 139 mmol/L (ref 134–144)
eGFR: 49 mL/min/{1.73_m2} — ABNORMAL LOW (ref >59)

## 2021-03-15 LAB — HEPATIC FUNCTION PANEL
ALT: 32 IU/L (ref 0–44)
AST: 30 IU/L (ref 0–40)
Albumin: 4.3 g/dL (ref 3.8–4.8)
Alkaline Phosphatase: 84 IU/L (ref 44–121)
Bilirubin Total: 0.5 mg/dL (ref 0.0–1.2)
Bilirubin, Direct: 0.19 mg/dL (ref 0.00–0.40)
Total Protein: 6.6 g/dL (ref 6.0–8.5)

## 2021-03-15 LAB — LIPID PANEL
Chol/HDL Ratio: 3.8 ratio (ref 0.0–5.0)
Cholesterol, Total: 118 mg/dL (ref 100–199)
HDL: 31 mg/dL — ABNORMAL LOW (ref >39)
LDL Chol Calc (NIH): 65 mg/dL (ref 0–99)
Triglycerides: 120 mg/dL (ref 0–149)
VLDL Cholesterol Cal: 22 mg/dL (ref 5–40)

## 2021-03-15 LAB — TSH: TSH: 3.32 u[IU]/mL (ref 0.450–4.500)

## 2021-04-12 ENCOUNTER — Other Ambulatory Visit: Payer: Self-pay | Admitting: Cardiology

## 2021-04-12 NOTE — Telephone Encounter (Signed)
Telmisartan 80 mg # 90 x 3 refills sent to   [facility], [place]

## 2021-04-18 DIAGNOSIS — Z789 Other specified health status: Secondary | ICD-10-CM

## 2021-04-18 HISTORY — DX: Other specified health status: Z78.9

## 2021-04-21 DIAGNOSIS — R32 Unspecified urinary incontinence: Secondary | ICD-10-CM

## 2021-04-21 HISTORY — DX: Unspecified urinary incontinence: R32

## 2021-05-09 ENCOUNTER — Other Ambulatory Visit: Payer: Self-pay | Admitting: Cardiology

## 2021-06-08 ENCOUNTER — Other Ambulatory Visit: Payer: Self-pay | Admitting: Cardiology

## 2021-06-26 ENCOUNTER — Other Ambulatory Visit: Payer: Self-pay | Admitting: Cardiology

## 2021-07-18 DIAGNOSIS — M542 Cervicalgia: Secondary | ICD-10-CM

## 2021-07-18 DIAGNOSIS — M51369 Other intervertebral disc degeneration, lumbar region without mention of lumbar back pain or lower extremity pain: Secondary | ICD-10-CM

## 2021-07-18 DIAGNOSIS — M5136 Other intervertebral disc degeneration, lumbar region: Secondary | ICD-10-CM

## 2021-07-18 DIAGNOSIS — M47812 Spondylosis without myelopathy or radiculopathy, cervical region: Secondary | ICD-10-CM

## 2021-07-18 DIAGNOSIS — M47816 Spondylosis without myelopathy or radiculopathy, lumbar region: Secondary | ICD-10-CM | POA: Insufficient documentation

## 2021-07-18 DIAGNOSIS — M5030 Other cervical disc degeneration, unspecified cervical region: Secondary | ICD-10-CM

## 2021-07-18 HISTORY — DX: Other cervical disc degeneration, unspecified cervical region: M50.30

## 2021-07-18 HISTORY — DX: Spondylosis without myelopathy or radiculopathy, cervical region: M47.812

## 2021-07-18 HISTORY — DX: Cervicalgia: M54.2

## 2021-07-18 HISTORY — DX: Spondylosis without myelopathy or radiculopathy, lumbar region: M47.816

## 2021-07-18 HISTORY — DX: Other intervertebral disc degeneration, lumbar region: M51.36

## 2021-07-18 HISTORY — DX: Other intervertebral disc degeneration, lumbar region without mention of lumbar back pain or lower extremity pain: M51.369

## 2021-07-25 ENCOUNTER — Other Ambulatory Visit: Payer: Self-pay | Admitting: Cardiology

## 2021-08-03 ENCOUNTER — Other Ambulatory Visit: Payer: Self-pay | Admitting: Cardiology

## 2021-09-13 DIAGNOSIS — Z9682 Presence of neurostimulator: Secondary | ICD-10-CM

## 2021-09-13 HISTORY — PX: IMPLANTATION OF HYPOGLOSSAL NERVE STIMULATOR: SHX6827

## 2021-09-13 HISTORY — DX: Presence of neurostimulator: Z96.82

## 2021-09-14 ENCOUNTER — Other Ambulatory Visit: Payer: Self-pay

## 2021-09-18 ENCOUNTER — Encounter: Payer: Self-pay | Admitting: Cardiology

## 2021-09-18 ENCOUNTER — Ambulatory Visit: Payer: Medicare Other | Admitting: Cardiology

## 2021-09-18 VITALS — BP 136/84 | HR 58 | Ht 71.0 in | Wt 213.4 lb

## 2021-09-18 DIAGNOSIS — I2510 Atherosclerotic heart disease of native coronary artery without angina pectoris: Secondary | ICD-10-CM

## 2021-09-18 DIAGNOSIS — I10 Essential (primary) hypertension: Secondary | ICD-10-CM

## 2021-09-18 DIAGNOSIS — I4729 Other ventricular tachycardia: Secondary | ICD-10-CM

## 2021-09-18 DIAGNOSIS — E119 Type 2 diabetes mellitus without complications: Secondary | ICD-10-CM

## 2021-09-18 DIAGNOSIS — E782 Mixed hyperlipidemia: Secondary | ICD-10-CM

## 2021-09-18 DIAGNOSIS — Z9861 Coronary angioplasty status: Secondary | ICD-10-CM

## 2021-09-18 DIAGNOSIS — Z1321 Encounter for screening for nutritional disorder: Secondary | ICD-10-CM

## 2021-09-18 NOTE — Patient Instructions (Signed)
Medication Instructions:  Your physician recommends that you continue on your current medications as directed. Please refer to the Current Medication list given to you today.  *If you need a refill on your cardiac medications before your next appointment, please call your pharmacy*   Lab Work: Your physician recommends that you have labs done in the office today. Your test included  basic metabolic panel, complete blood count, TSH, liver function and lipids.  If you have labs (blood work) drawn today and your tests are completely normal, you will receive your results only by: MyChart Message (if you have MyChart) OR A paper copy in the mail If you have any lab test that is abnormal or we need to change your treatment, we will call you to review the results.   Testing/Procedures: None ordered   Follow-Up: At [facility], you and your health needs are our priority.  As part of our continuing mission to provide you with exceptional heart care, we have created designated Provider Care Teams.  These Care Teams include your primary Cardiologist (physician) and Advanced Practice Providers (APPs -  Physician Assistants and Nurse Practitioners) who all work together to provide you with the care you need, when you need it.  We recommend signing up for the patient portal called "MyChart".  Sign up information is provided on this After Visit Summary.  MyChart is used to connect with patients for Virtual Visits (Telemedicine).  Patients are able to view lab/test results, encounter notes, upcoming appointments, etc.  Non-urgent messages can be sent to your provider as well.   To learn more about what you can do with MyChart, go to [email or web address].    Your next appointment:   9 month(s)  The format for your next appointment:   In Person  Provider:   Rajan Revankar, MD   Other Instructions NA   

## 2021-09-18 NOTE — Progress Notes (Signed)
Cardiology Office Note:    Date:  09/18/2021   ID:  Clarence Schneider, DOB 03-Aug-1950, MRN [id number]  PCP:  Algis Greenhouse, MD  Cardiologist:  Jenean Lindau, MD   Referring MD: Algis Greenhouse, MD    ASSESSMENT:    1. Nonsustained ventricular tachycardia ([place])   2. Mixed hyperlipidemia   3. Coronary artery disease involving native coronary artery of native heart without angina pectoris   4. CAD S/P percutaneous coronary angioplasty   5. Benign essential hypertension    PLAN:    In order of problems listed above:  Coronary artery disease: Secondary prevention stressed with the patient.  Importance of compliance with diet and medication stressed any vocalized understanding.  He was advised to walk at least half an hour a day 5 days a week and he promises to do so.  Weight reduction stressed. Essential hypertension: Blood pressure is stable and diet was emphasized. Mixed dyslipidemia: Lipids were reviewed.  His fasting We will do complete blood work today.  He also requests A1c and vitamin D and we will check this for him. Sleep apnea: Sleep health issues were discussed.  He recently had inspire device inserted for sleep apnea and is followed by his doctors for the same. Patient will be seen in follow-up appointment in 9 months or earlier if the patient has any concerns    Medication Adjustments/Labs and Tests Ordered: Current medicines are reviewed at length with the patient today.  Concerns regarding medicines are outlined above.  No orders of the defined types were placed in this encounter.  No orders of the defined types were placed in this encounter.    No chief complaint on file.    History of Present Illness:    Clarence Schneider is a 71 y.o. male.  Patient has past medical history of coronary artery disease, essential hypertension, dyslipidemia and sleep apnea.  He recently had an inspire device put for sleep apnea.  He denies any problems at this time and takes  care of activities of daily living.  No chest pain orthopnea or PND.  He walks on a regular basis.  At the time of my evaluation, the patient is alert awake oriented and in no distress.  Past Medical History:  Diagnosis Date   Acute non-recurrent sinusitis 09/02/2015   Formatting of this note might be different from the original. 2018: eth 03/28/2020   Adenomatous polyp of colon 03/20/2016   Overview:  2007  Formatting of this note might be different from the original. 2007   Allergic rhinitis 03/19/2016   Arthritis    Arthritis of right knee 03/26/2013   Back strain 07/05/2017   2019: right   Benign essential hypertension 12/27/2014   Formatting of this note might be different from the original. 1997: dx 2002: rx   Bladder disorder 05/21/2016   CAD S/P percutaneous coronary angioplasty 06/21/2017   Chronic frontal sinusitis 10/23/2017   2019  Formatting of this note might be different from the original. 2019   Chronic lumbar pain 03/19/2016   Chronic pain of both knees 07/12/2016   Chronic right shoulder pain 07/12/2016   Coronary artery disease    stents   Coronary artery disease involving native coronary artery of native heart 03/19/2016   Formatting of this note might be different from the original. 2005: onset sx 2005: cath diffuse disease, dissection/occlusion RCA, stent   DDD (degenerative disc disease), cervical 07/18/2021   DDD (degenerative disc disease), lumbar 07/18/2021  Dizziness    Facet arthropathy, cervical 07/18/2021   Facet arthropathy, lumbar 07/18/2021   Gastroesophageal reflux disease 02/16/2020   Herpes zoster without complication 16/01/9603   Formatting of this note might be different from the original. 2021: right T7, no rash   History of GI bleed 01/07/2018   Formatting of this note might be different from the original. 2019: melena, GI eval, continue ASA   Hyperlipemia    Hypertension    Hypotestosteronism 05/27/2015   Impingement syndrome of left shoulder  06/03/2014   Impotence 05/26/2015   Intolerance of continuous positive airway pressure (CPAP) ventilation 04/18/2021   Lateral epicondylitis of right elbow 01/04/2016   Overview:  2017  Formatting of this note might be different from the original. 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 1998: LDL 187 TG 317   Neck pain 07/18/2021   Neuromuscular disorder (HCC)    Takes Gabapentin   Nonsustained ventricular tachycardia ([place]) 06/14/2017   Obstructive sleep apnea 03/19/2016   Overview:  2012: dx, AHI 69 with desaturation, BiPAP  Formatting of this note might be different from the original. Split-night sleep study 10/25/2017, AHI 63.8, CPAP 20   OSA treated with BiPAP 03/19/2016   Overview:  2012: dx, AHI 69 with desaturation, BiPAP  Formatting of this note might be different from the original. Split-night sleep study 10/25/2017, AHI 63.8, CPAP 20  Formatting of this note might be different from the original. Titration 01/13/2020 BiPAP 25/20 Split-night sleep study 10/25/2017, AHI 63.8, CPAP 20   Peripheral arterial disease ([place]) 03/19/2016   Pinched nerve    in back   Pre-diabetes 06/18/2016   S/P arthroscopy of right shoulder 06/28/2014   S/P total knee arthroplasty 05/11/2013   S/P total knee replacement using cement 11/30/2013   Screening for colon cancer 03/19/2016   Screening for prostate cancer 03/19/2016   Sebaceous cyst 05/26/2019   Formatting of this note might be different from the original. 2021: post neck excision   Shortness of breath    with exertion   Sinusitis 09/02/2015   Overview:  2018:   Overview:  2018: eth   Sinusitis, acute ethmoidal 09/02/2015   Formatting of this note  might be different from the original. 2018: eth 03/28/2020   Skin abrasion    under both arms; uses Lotrisone cream twice a day   Sleep apnea    cpap doesnt use reg   Slow heart rate 06/14/2017   Stage 3a chronic kidney disease ([place]) 03/28/2020   Subacromial bursitis of right shoulder joint 01/04/2016   Overview:  2017  Formatting of this note might be different from the original. 2017   Trigeminal neuralgia 03/19/2016   Type 2 diabetes mellitus without complication, without long-term current use of insulin ([place]) 06/18/2016   Formatting of this note might be different from the original. 06/23/2020 : 132/6.9   Urinary incontinence 04/21/2021   Formatting of this note might be different from the original. 04/21/2021:   Verrucous skin lesion 03/28/2020   Formatting of this note might be different from the original. 03/28/2020 : right deltoid region    Past Surgical History:  Procedure Laterality Date   APPENDECTOMY  CARDIAC CATHETERIZATION     X 3 stents   CARPAL TUNNEL RELEASE Bilateral    COLONOSCOPY W/ POLYPECTOMY     CORONARY BALLOON ANGIOPLASTY N/A 06/21/2017   Procedure: CORONARY BALLOON ANGIOPLASTY;  Surgeon: Jettie Booze, MD;  Location: [facility];  Service: Cardiovascular;  Laterality: N/A;   ELBOW ARTHROSCOPY Bilateral    ulner nerve   2 surgeries   INGUINAL HERNIA REPAIR Right    KNEE ARTHROSCOPY Bilateral    Left x 1 Right X 2   LEFT HEART CATH AND CORONARY ANGIOGRAPHY N/A 06/21/2017   Procedure: LEFT HEART CATH AND CORONARY ANGIOGRAPHY;  Surgeon: Jettie Booze, MD;  Location: [facility];  Service: Cardiovascular;  Laterality: N/A;   SHOULDER ARTHROSCOPY Left    TONSILLECTOMY     TOTAL KNEE ARTHROPLASTY Left 05/11/2013   Procedure: LEFT TOTAL KNEE ARTHROPLASTY;  Surgeon: Vickey Huger, MD;  Location: [facility];  Service: Orthopedics;  Laterality: Left;   TOTAL KNEE ARTHROPLASTY Right 11/30/2013   dr Ronnie Derby   TOTAL KNEE ARTHROPLASTY Right 11/30/2013    Procedure: TOTAL KNEE ARTHROPLASTY;  Surgeon: Vickey Huger, MD;  Location: [facility];  Service: Orthopedics;  Laterality: Right;    Current Medications: Current Meds  Medication Sig   amLODipine (NORVASC) 5 MG tablet Take 1 tablet (5 mg total) by mouth daily.   ascorbic acid (VITAMIN C) 1000 MG tablet Take 1,000 mg by mouth daily.   aspirin 81 MG EC tablet Take 81 mg by mouth daily.    carvedilol (COREG) 12.5 MG tablet Take 1 tablet (12.5 mg total) by mouth 2 (two) times daily.   cycloSPORINE (RESTASIS) 0.05 % ophthalmic emulsion Place 1 drop into both eyes daily.   ezetimibe (ZETIA) 10 MG tablet Take 1 tablet (10 mg total) by mouth daily.   FARXIGA 10 MG TABS tablet Take 10 mg by mouth daily.   fenofibrate 160 MG tablet Take 1 tablet (160 mg total) by mouth every evening.   fexofenadine (ALLEGRA) 180 MG tablet Take 180 mg by mouth daily.   gabapentin (NEURONTIN) 300 MG capsule Take 300 mg by mouth 3 (three) times daily as needed for pain.   hydrochlorothiazide (HYDRODIURIL) 12.5 MG tablet TAKE 1 TABLET BY MOUTH ONCE DAILY.   montelukast (SINGULAIR) 10 MG tablet Take 10 mg by mouth daily.   Multiple Vitamins-Minerals (MULTIVITAMIN WITH MINERALS) tablet Take 1 tablet by mouth daily.   nitroGLYCERIN (NITROSTAT) 0.4 MG SL tablet Place 0.4 mg under the tongue every 5 (five) minutes as needed for chest pain.   omega-3 acid ethyl esters (LOVAZA) 1 g capsule TAKE 2 CAPSULES BY MOUTH TWICE A DAY   pyridoxine (B-6) 200 MG tablet Take 200 mg by mouth daily.   rosuvastatin (CRESTOR) 40 MG tablet Take 1 tablet (40 mg total) by mouth daily.   sildenafil (REVATIO) 20 MG tablet Take 20 mg by mouth as needed for erectile dysfunction.   tamsulosin (FLOMAX) 0.4 MG CAPS capsule Take 0.4 mg by mouth every evening.   telmisartan (MICARDIS) 80 MG tablet TAKE 1 TABLET BY MOUTH DAILY   testosterone cypionate (DEPOTESTOSTERONE CYPIONATE) 200 MG/ML injection Inject 1.25 mLs into the muscle every 14 (fourteen) days.    Tetrahydrozoline-Zn Sulfate (EYE DROPS RELIEF OP) Place 1 drop into both eyes 2 (two) times daily.   triamcinolone (NASACORT) 55 MCG/ACT AERO nasal inhaler Place 2 sprays into the nose daily.   vitamin B-12 (CYANOCOBALAMIN) 1000 MCG tablet Take 1,000 mcg by mouth daily.  Allergies:   Oxycontin [oxycodone hcl], Metoclopramide, Oxycodone, and Tape   Social History   Socioeconomic History   Marital status: Married    Spouse name: Not on file   Number of children: Not on file   Years of education: Not on file   Highest education level: Not on file  Occupational History   Not on file  Tobacco Use   Smoking status: Never   Smokeless tobacco: Never  Vaping Use   Vaping Use: Never used  Substance and Sexual Activity   Alcohol use: No   Drug use: No   Sexual activity: Not on file  Other Topics Concern   Not on file  Social History Narrative   Not on file   Social Determinants of Health   Financial Resource Strain: Not on file  Food Insecurity: Not on file  Transportation Needs: Not on file  Physical Activity: Not on file  Stress: Not on file  Social Connections: Not on file     Family History: The patient's family history includes COPD in his brother; Cancer in his brother and father; Diabetes in his father; Heart attack in his brother; Hyperlipidemia in his father; Hypertension in his father and mother; Prostate cancer in his brother.  ROS:   Please see the history of present illness.    All other systems reviewed and are negative.  EKGs/Labs/Other Studies Reviewed:    The following studies were reviewed today: EKG reveals sinus rhythm and nonspecific ST-T changes   Recent Labs: 03/14/2021: ALT 32; BUN 19; Creatinine, Ser 1.54; Hemoglobin 15.6; Platelets 173; Potassium 4.3; Sodium 139; TSH 3.320  Recent Lipid Panel    Component Value Date/Time   CHOL 118 03/14/2021 0912   TRIG 120 03/14/2021 0912   HDL 31 (L) 03/14/2021 0912   CHOLHDL 3.8 03/14/2021 0912    LDLCALC 65 03/14/2021 0912    Physical Exam:    VS:  BP 136/84   Pulse (!) 58   Ht '5\' 11"'$  (1.803 m)   Wt 213 lb 6.4 oz (96.8 kg)   SpO2 97%   BMI 29.76 kg/m     Wt Readings from Last 3 Encounters:  09/18/21 213 lb 6.4 oz (96.8 kg)  03/14/21 229 lb 3.2 oz (104 kg)  09/01/20 224 lb (101.6 kg)     GEN: Patient is in no acute distress HEENT: Normal NECK: No JVD; No carotid bruits LYMPHATICS: No lymphadenopathy CARDIAC: Hear sounds regular, 2/6 systolic murmur at the apex. RESPIRATORY:  Clear to auscultation without rales, wheezing or rhonchi  ABDOMEN: Soft, non-tender, non-distended MUSCULOSKELETAL:  No edema; No deformity  SKIN: Warm and dry NEUROLOGIC:  Alert and oriented x 3 PSYCHIATRIC:  Normal affect   Signed, Jenean Lindau, MD  09/18/2021 8:05 AM    [place]

## 2021-09-19 LAB — CBC WITH DIFFERENTIAL/PLATELET
Basophils Absolute: 0 10*3/uL (ref 0.0–0.2)
Basos: 1 %
EOS (ABSOLUTE): 0.2 10*3/uL (ref 0.0–0.4)
Eos: 4 %
Hematocrit: 54.8 % — ABNORMAL HIGH (ref 37.5–51.0)
Hemoglobin: 18.4 g/dL — ABNORMAL HIGH (ref 13.0–17.7)
Immature Grans (Abs): 0 10*3/uL (ref 0.0–0.1)
Immature Granulocytes: 0 %
Lymphocytes Absolute: 1.1 10*3/uL (ref 0.7–3.1)
Lymphs: 24 %
MCH: 30.1 pg (ref 26.6–33.0)
MCHC: 33.6 g/dL (ref 31.5–35.7)
MCV: 90 fL (ref 79–97)
Monocytes Absolute: 0.5 10*3/uL (ref 0.1–0.9)
Monocytes: 10 %
Neutrophils Absolute: 2.7 10*3/uL (ref 1.4–7.0)
Neutrophils: 61 %
Platelets: 197 10*3/uL (ref 150–450)
RBC: 6.12 x10E6/uL — ABNORMAL HIGH (ref 4.14–5.80)
RDW: 14.5 % (ref 11.6–15.4)
WBC: 4.5 10*3/uL (ref 3.4–10.8)

## 2021-09-19 LAB — TSH: TSH: 2.35 u[IU]/mL (ref 0.450–4.500)

## 2021-09-19 LAB — VITAMIN D 25 HYDROXY (VIT D DEFICIENCY, FRACTURES): Vit D, 25-Hydroxy: 40.2 ng/mL (ref 30.0–100.0)

## 2021-09-19 LAB — BASIC METABOLIC PANEL
BUN/Creatinine Ratio: 13 (ref 10–24)
BUN: 18 mg/dL (ref 8–27)
CO2: 27 mmol/L (ref 20–29)
Calcium: 9.8 mg/dL (ref 8.6–10.2)
Chloride: 98 mmol/L (ref 96–106)
Creatinine, Ser: 1.42 mg/dL — ABNORMAL HIGH (ref 0.76–1.27)
Glucose: 171 mg/dL — ABNORMAL HIGH (ref 70–99)
Potassium: 4.2 mmol/L (ref 3.5–5.2)
Sodium: 138 mmol/L (ref 134–144)
eGFR: 53 mL/min/{1.73_m2} — ABNORMAL LOW (ref >59)

## 2021-09-19 LAB — LIPID PANEL
Chol/HDL Ratio: 3.9 ratio (ref 0.0–5.0)
Cholesterol, Total: 136 mg/dL (ref 100–199)
HDL: 35 mg/dL — ABNORMAL LOW (ref >39)
LDL Chol Calc (NIH): 70 mg/dL (ref 0–99)
Triglycerides: 182 mg/dL — ABNORMAL HIGH (ref 0–149)
VLDL Cholesterol Cal: 31 mg/dL (ref 5–40)

## 2021-09-19 LAB — HEPATIC FUNCTION PANEL
ALT: 29 IU/L (ref 0–44)
AST: 31 IU/L (ref 0–40)
Albumin: 4.5 g/dL (ref 3.8–4.8)
Alkaline Phosphatase: 75 IU/L (ref 44–121)
Bilirubin Total: 0.5 mg/dL (ref 0.0–1.2)
Bilirubin, Direct: 0.18 mg/dL (ref 0.00–0.40)
Total Protein: 6.8 g/dL (ref 6.0–8.5)

## 2021-09-19 LAB — HEMOGLOBIN A1C
Est. average glucose Bld gHb Est-mCnc: 174 mg/dL
Hgb A1c MFr Bld: 7.7 % — ABNORMAL HIGH (ref 4.8–5.6)

## 2021-11-14 ENCOUNTER — Other Ambulatory Visit: Payer: Self-pay | Admitting: Cardiology

## 2022-01-01 ENCOUNTER — Other Ambulatory Visit: Payer: Self-pay | Admitting: Cardiology

## 2022-01-08 DIAGNOSIS — N401 Enlarged prostate with lower urinary tract symptoms: Secondary | ICD-10-CM

## 2022-01-08 HISTORY — DX: Benign prostatic hyperplasia with lower urinary tract symptoms: N40.1

## 2022-01-17 ENCOUNTER — Other Ambulatory Visit: Payer: Self-pay | Admitting: Cardiology

## 2022-01-17 NOTE — Telephone Encounter (Signed)
Refill to pharmacy 

## 2022-03-01 ENCOUNTER — Other Ambulatory Visit: Payer: Self-pay | Admitting: Cardiology

## 2022-04-13 ENCOUNTER — Other Ambulatory Visit: Payer: Self-pay | Admitting: Cardiology

## 2022-04-13 NOTE — Telephone Encounter (Signed)
Rx refill sent to pharmacy. 

## 2022-04-24 ENCOUNTER — Other Ambulatory Visit: Payer: Self-pay | Admitting: Cardiology

## 2022-05-08 ENCOUNTER — Other Ambulatory Visit: Payer: Self-pay | Admitting: Cardiology

## 2022-05-29 DIAGNOSIS — M65342 Trigger finger, left ring finger: Secondary | ICD-10-CM

## 2022-05-29 HISTORY — DX: Trigger finger, left ring finger: M65.342

## 2022-07-18 ENCOUNTER — Other Ambulatory Visit: Payer: Self-pay | Admitting: Cardiology

## 2022-08-02 ENCOUNTER — Other Ambulatory Visit: Payer: Self-pay | Admitting: Cardiology

## 2022-08-28 ENCOUNTER — Other Ambulatory Visit: Payer: Self-pay | Admitting: Cardiology

## 2022-09-13 ENCOUNTER — Other Ambulatory Visit: Payer: Self-pay | Admitting: Cardiology

## 2022-09-25 ENCOUNTER — Other Ambulatory Visit: Payer: Self-pay | Admitting: Cardiology

## 2022-10-03 ENCOUNTER — Other Ambulatory Visit: Payer: Self-pay | Admitting: Cardiology

## 2022-10-06 ENCOUNTER — Other Ambulatory Visit: Payer: Self-pay | Admitting: Cardiology

## 2022-10-08 ENCOUNTER — Telehealth: Payer: Self-pay | Admitting: Cardiology

## 2022-10-08 MED ORDER — CARVEDILOL 12.5 MG PO TABS: 12.5000 mg | ORAL_TABLET | Freq: Two times a day (BID) | ORAL | 0 refills | Status: DC

## 2022-10-08 NOTE — Telephone Encounter (Signed)
Pt c/o medication issue:  1. Name of Medication: carvedilol (COREG) 12.5 MG tablet   2. How are you currently taking this medication (dosage and times per day)? As written   3. Are you having a reaction (difficulty breathing--STAT)? No   4. What is your medication issue? Pharmacy called in about this rx sent in today. She states because pt takes this twice daily he will need 34 tablets instead of 17 to last until his next appt date. Please advise.

## 2022-10-16 ENCOUNTER — Other Ambulatory Visit: Payer: Self-pay | Admitting: Cardiology

## 2022-10-24 ENCOUNTER — Other Ambulatory Visit: Payer: Self-pay | Admitting: Cardiology

## 2022-10-25 ENCOUNTER — Ambulatory Visit: Payer: Medicare Other | Attending: Cardiology | Admitting: Cardiology

## 2022-10-25 ENCOUNTER — Encounter: Payer: Self-pay | Admitting: Cardiology

## 2022-10-25 VITALS — BP 104/80 | HR 46 | Ht 71.0 in | Wt 209.0 lb

## 2022-10-25 DIAGNOSIS — I2510 Atherosclerotic heart disease of native coronary artery without angina pectoris: Secondary | ICD-10-CM | POA: Diagnosis not present

## 2022-10-25 DIAGNOSIS — I4729 Other ventricular tachycardia: Secondary | ICD-10-CM | POA: Diagnosis not present

## 2022-10-25 DIAGNOSIS — I10 Essential (primary) hypertension: Secondary | ICD-10-CM | POA: Diagnosis not present

## 2022-10-25 DIAGNOSIS — E119 Type 2 diabetes mellitus without complications: Secondary | ICD-10-CM | POA: Diagnosis not present

## 2022-10-25 DIAGNOSIS — E782 Mixed hyperlipidemia: Secondary | ICD-10-CM

## 2022-10-25 DIAGNOSIS — N1831 Chronic kidney disease, stage 3a: Secondary | ICD-10-CM

## 2022-10-25 MED ORDER — NITROGLYCERIN 0.4 MG SL SUBL: 0.4000 mg | SUBLINGUAL_TABLET | SUBLINGUAL | 3 refills | Status: AC | PRN

## 2022-10-25 MED ORDER — CARVEDILOL 12.5 MG PO TABS: 12.5000 mg | ORAL_TABLET | Freq: Two times a day (BID) | ORAL | 3 refills | Status: DC

## 2022-10-25 MED ORDER — CLOPIDOGREL BISULFATE 75 MG PO TABS: 75.0000 mg | ORAL_TABLET | Freq: Every day | ORAL | 3 refills | Status: DC

## 2022-10-25 MED ORDER — HYDROCHLOROTHIAZIDE 12.5 MG PO CAPS: 12.5000 mg | ORAL_CAPSULE | Freq: Every day | ORAL | 3 refills | Status: DC

## 2022-10-25 NOTE — Progress Notes (Addendum)
Cardiology Office Note:    Date:  10/25/2022   ID:  Clarence Schneider, DOB Sep 05, 1950, MRN [id number]  PCP:  Olive Bass, MD  Cardiologist:  Garwin Brothers, MD   Referring MD: Olive Bass, MD    ASSESSMENT:    1. Coronary artery disease involving native coronary artery of native heart without angina pectoris   2. Benign essential hypertension   3. Nonsustained ventricular tachycardia (HCC)   4. Type 2 diabetes mellitus without complication, without long-term current use of insulin (HCC)   5. Stage 3a chronic kidney disease (HCC)   6. Mixed hyperlipidemia    PLAN:    In order of problems listed above:  Coronary artery disease: Patient is doing very well with exercise protocol.  Secondary prevention stressed.  Importance of compliance with diet medication stressed any vocalized understanding.  He was advised to continue his excellent exercise protocol. Essential hypertension: Blood pressure stable and diet was emphasized.  Lifestyle modification urged. Mixed dyslipidemia: On lipid-lowering medications followed by primary care.  He is here for fasting and will do complete blood work today.  Will also obtain hemoglobin A1c level. Cardiac murmur: Echocardiogram will be done to assess murmur heard on auscultation. Patient will be seen in follow-up appointment in 6 months or earlier if the patient has any concerns.  I received a message saying the patient needs to be evaluated in the disc start provide for prostate biopsy.  Based on his effort tolerance he is not at high risk for coronary events during this procedure in my opinion.  Also Plavix can be withheld for the  timeframe can be decided by the surgeon performing the procedure   Dr. Glean Hess Kyilee Gregg 11/05/2022    Medication Adjustments/Labs and Tests Ordered: Current medicines are reviewed at length with the patient today.  Concerns regarding medicines are outlined above.  Orders Placed This Encounter  Procedures   EKG  12-Lead   Meds ordered this encounter  Medications   nitroGLYCERIN (NITROSTAT) 0.4 MG SL tablet    Sig: Place 1 tablet (0.4 mg total) under the tongue every 5 (five) minutes as needed for chest pain.    Dispense:  25 tablet    Refill:  3     Chief Complaint  Patient presents with   Follow-up     History of Present Illness:    Clarence Schneider is a 72 y.o. male.  Patient has past medical history of coronary artery disease, essential hypertension, mixed dyslipidemia and diabetes mellitus.  Patient also has renal insufficiency.  He denies any problems at this time and takes care of activities of daily living.  He mentions to me that he walks 2 to 3 miles on a daily basis without any problems.  At the time of my evaluation, the patient is alert awake oriented and in no distress.  Past Medical History:  Diagnosis Date   Acute non-recurrent sinusitis 09/02/2015   Formatting of this note might be different from the original. 2018: eth 03/28/2020   Adenomatous polyp of colon 03/20/2016   Overview:  2007  Formatting of this note might be different from the original. 2007   Allergic rhinitis 03/19/2016   Arthritis    Arthritis of right knee 03/26/2013   Back strain 07/05/2017   2019: right   Benign essential hypertension 12/27/2014   Formatting of this note might be different from the original. 1997: dx 2002: rx   Bladder disorder 05/21/2016   CAD S/P percutaneous coronary angioplasty  06/21/2017   Chronic frontal sinusitis 10/23/2017   2019  Formatting of this note might be different from the original. 2019   Chronic lumbar pain 03/19/2016   Chronic pain of both knees 07/12/2016   Chronic right shoulder pain 07/12/2016   Coronary artery disease    stents   Coronary artery disease involving native coronary artery of native heart 03/19/2016   Formatting of this note might be different from the original. 2005: onset sx 2005: cath diffuse disease, dissection/occlusion RCA, stent   DDD  (degenerative disc disease), cervical 07/18/2021   DDD (degenerative disc disease), lumbar 07/18/2021   Dizziness    Enlarged prostate with lower urinary tract symptoms (LUTS) 01/08/2022   Formatting of this note might be different from the original. 06/2021: cysto Formatting of this note might be different from the original. 06/2021: cysto   Facet arthropathy, cervical 07/18/2021   Facet arthropathy, lumbar 07/18/2021   Gastroesophageal reflux disease 02/16/2020   Herpes zoster without complication 10/05/2019   Formatting of this note might be different from the original. 2021: right T7, no rash   History of GI bleed 01/07/2018   Formatting of this note might be different from the original. 2019: melena, GI eval, continue ASA   Hyperlipemia    Hypertension    Hypotestosteronism 05/27/2015   Impingement syndrome of left shoulder 06/03/2014   Impotence 05/26/2015   Intolerance of continuous positive airway pressure (CPAP) ventilation 04/18/2021   Lateral epicondylitis of right elbow 01/04/2016   Overview:  2017  Formatting of this note might be different from the original. 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 1998: LDL 187 TG 317   Neck pain 07/18/2021   Neuromuscular disorder (HCC)    Takes Gabapentin   Nonsustained ventricular tachycardia (HCC) 06/14/2017   Obstructive sleep apnea 03/19/2016   Overview:  2012: dx, AHI 69 with desaturation, BiPAP  Formatting of this note might be different from the original. Split-night sleep study 10/25/2017, AHI 63.8, CPAP 20   OSA treated with BiPAP 03/19/2016   Overview:  2012: dx, AHI 69 with desaturation, BiPAP  Formatting of this note might be  different from the original. Split-night sleep study 10/25/2017, AHI 63.8, CPAP 20  Formatting of this note might be different from the original. Titration 01/13/2020 BiPAP 25/20 Split-night sleep study 10/25/2017, AHI 63.8, CPAP 20   Peripheral arterial disease (HCC) 03/19/2016   Pinched nerve    in back   Pre-diabetes 06/18/2016   S/P arthroscopy of right shoulder 06/28/2014   S/P total knee arthroplasty 05/11/2013   S/P total knee replacement using cement 11/30/2013   Screening for colon cancer 03/19/2016   Screening for prostate cancer 03/19/2016   Sebaceous cyst 05/26/2019   Formatting of this note might be different from the original. 2021: post neck excision   Shortness of breath    with exertion   Sinusitis 09/02/2015   Overview:  2018:   Overview:  2018: eth   Sinusitis, acute ethmoidal 09/02/2015   Formatting of this note might be different from the original. 2018: eth 03/28/2020   Skin abrasion    under both arms; uses Lotrisone cream twice a day   Sleep  apnea    cpap doesnt use reg   Slow heart rate 06/14/2017   Stage 3a chronic kidney disease (HCC) 03/28/2020   Subacromial bursitis of right shoulder joint 01/04/2016   Overview:  2017  Formatting of this note might be different from the original. 2017   Trigeminal neuralgia 03/19/2016   Trigger ring finger of left hand 05/29/2022   Formatting of this note might be different from the original. 05/29/2022 Formatting of this note might be different from the original. 05/29/2022   Type 2 diabetes mellitus without complication, without long-term current use of insulin (HCC) 06/18/2016   Formatting of this note might be different from the original. 06/23/2020 : 132/6.9   Urinary incontinence 04/21/2021   Formatting of this note might be different from the original. 04/21/2021:   Verrucous skin lesion 03/28/2020   Formatting of this note might be different from the original. 03/28/2020 : right deltoid region    Past Surgical  History:  Procedure Laterality Date   APPENDECTOMY     CARDIAC CATHETERIZATION     X 3 stents   CARPAL TUNNEL RELEASE Bilateral    COLONOSCOPY W/ POLYPECTOMY     CORONARY BALLOON ANGIOPLASTY N/A 06/21/2017   Procedure: CORONARY BALLOON ANGIOPLASTY;  Surgeon: Corky Crafts, MD;  Location: [facility] INVASIVE CV LAB;  Service: Cardiovascular;  Laterality: N/A;   ELBOW ARTHROSCOPY Bilateral    ulner nerve   2 surgeries   INGUINAL HERNIA REPAIR Right    KNEE ARTHROSCOPY Bilateral    Left x 1 Right X 2   LEFT HEART CATH AND CORONARY ANGIOGRAPHY N/A 06/21/2017   Procedure: LEFT HEART CATH AND CORONARY ANGIOGRAPHY;  Surgeon: Corky Crafts, MD;  Location: [facility] INVASIVE CV LAB;  Service: Cardiovascular;  Laterality: N/A;   SHOULDER ARTHROSCOPY Left    TONSILLECTOMY     TOTAL KNEE ARTHROPLASTY Left 05/11/2013   Procedure: LEFT TOTAL KNEE ARTHROPLASTY;  Surgeon: Dannielle Huh, MD;  Location: [facility] OR;  Service: Orthopedics;  Laterality: Left;   TOTAL KNEE ARTHROPLASTY Right 11/30/2013   dr Sherlean Foot   TOTAL KNEE ARTHROPLASTY Right 11/30/2013   Procedure: TOTAL KNEE ARTHROPLASTY;  Surgeon: Dannielle Huh, MD;  Location: [facility] OR;  Service: Orthopedics;  Laterality: Right;    Current Medications: Current Meds  Medication Sig   ascorbic acid (VITAMIN C) 1000 MG tablet Take 1,000 mg by mouth daily.   aspirin 81 MG EC tablet Take 81 mg by mouth daily.    carvedilol (COREG) 12.5 MG tablet Take 1 tablet (12.5 mg total) by mouth 2 (two) times daily. Patient must keep appointment for 10/25/22  for further refills. 3 rd/final attempt   clopidogrel (PLAVIX) 75 MG tablet Take 1 tablet (75 mg total) by mouth daily. Patient must keep appointment for 10/25/22  for further refills. 3 rd/final attempt   cycloSPORINE (RESTASIS) 0.05 % ophthalmic emulsion Place 1 drop into both eyes daily.   ezetimibe (ZETIA) 10 MG tablet Take 1 tablet (10 mg total) by mouth daily.   FARXIGA 10 MG TABS tablet Take 10 mg by mouth daily.    fenofibrate 160 MG tablet Take 1 tablet (160 mg total) by mouth every evening.   fexofenadine (ALLEGRA) 180 MG tablet Take 180 mg by mouth daily.   gabapentin (NEURONTIN) 300 MG capsule Take 300 mg by mouth 3 (three) times daily as needed for pain.   hydrochlorothiazide (MICROZIDE) 12.5 MG capsule Take 1 capsule (12.5 mg total) by mouth daily.   montelukast (SINGULAIR) 10 MG tablet Take  10 mg by mouth daily.   Multiple Vitamins-Minerals (MULTIVITAMIN WITH MINERALS) tablet Take 1 tablet by mouth daily.   omega-3 acid ethyl esters (LOVAZA) 1 g capsule TAKE 2 CAPSULES BY MOUTH TWO TIMES DAILY.   pyridoxine (B-6) 200 MG tablet Take 200 mg by mouth daily.   rosuvastatin (CRESTOR) 40 MG tablet Take 1 tablet (40 mg total) by mouth daily.   sildenafil (REVATIO) 20 MG tablet Take 20 mg by mouth as needed for erectile dysfunction.   tamsulosin (FLOMAX) 0.4 MG CAPS capsule Take 0.4 mg by mouth every evening.   telmisartan (MICARDIS) 80 MG tablet Take 1 tablet (80 mg total) by mouth daily.   testosterone cypionate (DEPOTESTOSTERONE CYPIONATE) 200 MG/ML injection Inject 1.25 mLs into the muscle every 14 (fourteen) days.   Tetrahydrozoline-Zn Sulfate (EYE DROPS RELIEF OP) Place 1 drop into both eyes 2 (two) times daily.   triamcinolone (NASACORT) 55 MCG/ACT AERO nasal inhaler Place 2 sprays into the nose daily.   vitamin B-12 (CYANOCOBALAMIN) 1000 MCG tablet Take 1,000 mcg by mouth daily.   [DISCONTINUED] nitroGLYCERIN (NITROSTAT) 0.4 MG SL tablet Place 0.4 mg under the tongue every 5 (five) minutes as needed for chest pain.     Allergies:   Oxycontin [oxycodone hcl], Metoclopramide, Oxycodone, and Tape   Social History   Socioeconomic History   Marital status: Married    Spouse name: Not on file   Number of children: Not on file   Years of education: Not on file   Highest education level: Not on file  Occupational History   Not on file  Tobacco Use   Smoking status: Never   Smokeless tobacco:  Never  Vaping Use   Vaping status: Never Used  Substance and Sexual Activity   Alcohol use: No   Drug use: No   Sexual activity: Not on file  Other Topics Concern   Not on file  Social History Narrative   Not on file   Social Determinants of Health   Financial Resource Strain: Low Risk  (01/07/2018)   Received from [facility] visits prior to 06/16/2022.   Overall Financial Resource Strain (CARDIA)    Difficulty of Paying Living Expenses: Not hard at all  Food Insecurity: No Food Insecurity (01/07/2018)   Received from [facility] visits prior to 06/16/2022.   Hunger Vital Sign    Worried About Running Out of Food in the Last Year: Never true    Ran Out of Food in the Last Year: Never true  Transportation Needs: No Transportation Needs (01/07/2018)   Received from [facility] visits prior to 06/16/2022.   PRAPARE - Administrator, Civil Service (Medical): No    Lack of Transportation (Non-Medical): No  Physical Activity: Sufficiently Active (01/07/2018)   Received from [facility] visits prior to 06/16/2022.   Exercise Vital Sign    Days of Exercise per Week: 3 days    Minutes of Exercise per Session: 60 min  Stress: No Stress Concern Present (01/07/2018)   Received from [facility] visits prior to 06/16/2022.   [employer] of Occupational Health - Occupational Stress Questionnaire    Feeling of Stress : Not at all  Social Connections: Socially Integrated (01/07/2018)   Received from [facility] visits prior to 06/16/2022.   Social Connection and Isolation Panel [NHANES]    Frequency of Communication with Friends and Family: Twice a  week    Frequency of Social Gatherings with Friends and Family: Twice a week    Attends Religious Services: 1 to 4 times per year    Active Member of [employer] or Organizations: Yes    Attends Hospital doctor: 1 to 4 times per year    Marital Status: Married     Family History: The patient's family history includes COPD in his brother; Cancer in his brother and father; Diabetes in his father; Heart attack in his brother; Hyperlipidemia in his father; Hypertension in his father and mother; Prostate cancer in his brother.  ROS:   Please see the history of present illness.    All other systems reviewed and are negative.  EKGs/Labs/Other Studies Reviewed:    The following studies were reviewed today: EKG reveals sinus bradycardia nonspecific ST-T changes   Recent Labs: No results found for requested labs within last 365 days.  Recent Lipid Panel    Component Value Date/Time   CHOL 136 09/18/2021 0826   TRIG 182 (H) 09/18/2021 0826   HDL 35 (L) 09/18/2021 0826   CHOLHDL 3.9 09/18/2021 0826   LDLCALC 70 09/18/2021 0826    Physical Exam:    VS:  BP 104/80 (BP Location: Left Arm, Patient Position: Sitting, Cuff Size: Normal)   Pulse (!) 46   Ht 5\' 11"  (1.803 m)   Wt 209 lb (94.8 kg)   SpO2 95%   BMI 29.15 kg/m     Wt Readings from Last 3 Encounters:  10/25/22 209 lb (94.8 kg)  09/18/21 213 lb 6.4 oz (96.8 kg)  03/14/21 229 lb 3.2 oz (104 kg)     GEN: Patient is in no acute distress HEENT: Normal NECK: No JVD; No carotid bruits LYMPHATICS: No lymphadenopathy CARDIAC: Hear sounds regular, 2/6 systolic murmur at the apex. RESPIRATORY:  Clear to auscultation without rales, wheezing or rhonchi  ABDOMEN: Soft, non-tender, non-distended MUSCULOSKELETAL:  No edema; No deformity  SKIN: Warm and dry NEUROLOGIC:  Alert and oriented x 3 PSYCHIATRIC:  Normal affect   Signed, Garwin Brothers, MD  10/25/2022 8:19 AM    [facility]

## 2022-10-25 NOTE — Patient Instructions (Signed)
Medication Instructions:  Your physician recommends that you continue on your current medications as directed. Please refer to the Current Medication list given to you today.  *If you need a refill on your cardiac medications before your next appointment, please call your pharmacy*   Lab Work: Your physician recommends that you return for lab work in:   Labs today: BMP, CBC, TSH, LFT, Lipids, Hbg A1c, Vitamin D  If you have labs (blood work) drawn today and your tests are completely normal, you will receive your results only by: MyChart Message (if you have MyChart) OR A paper copy in the mail If you have any lab test that is abnormal or we need to change your treatment, we will call you to review the results.   Testing/Procedures: Your physician has requested that you have an echocardiogram. Echocardiography is a painless test that uses sound waves to create images of your heart. It provides your doctor with information about the size and shape of your heart and how well your heart's chambers and valves are working. This procedure takes approximately one hour. There are no restrictions for this procedure. Please do NOT wear cologne, perfume, aftershave, or lotions (deodorant is allowed). Please arrive 15 minutes prior to your appointment time.    Follow-Up: At [facility], you and your health needs are our priority.  As part of our continuing mission to provide you with exceptional heart care, we have created designated Provider Care Teams.  These Care Teams include your primary Cardiologist (physician) and Advanced Practice Providers (APPs -  Physician Assistants and Nurse Practitioners) who all work together to provide you with the care you need, when you need it.  We recommend signing up for the patient portal called "MyChart".  Sign up information is provided on this After Visit Summary.  MyChart is used to connect with patients for Virtual Visits (Telemedicine).  Patients  are able to view lab/test results, encounter notes, upcoming appointments, etc.  Non-urgent messages can be sent to your provider as well.   To learn more about what you can do with MyChart, go to [email or web address].    Your next appointment:   9 month(s)  Provider:   Belva Crome, MD    Other Instructions None

## 2022-10-26 LAB — BASIC METABOLIC PANEL
BUN/Creatinine Ratio: 12 (ref 10–24)
BUN: 19 mg/dL (ref 8–27)
CO2: 27 mmol/L (ref 20–29)
Calcium: 9.6 mg/dL (ref 8.6–10.2)
Chloride: 100 mmol/L (ref 96–106)
Creatinine, Ser: 1.6 mg/dL — ABNORMAL HIGH (ref 0.76–1.27)
Glucose: 161 mg/dL — ABNORMAL HIGH (ref 70–99)
Potassium: 4.1 mmol/L (ref 3.5–5.2)
Sodium: 139 mmol/L (ref 134–144)
eGFR: 46 mL/min/{1.73_m2} — ABNORMAL LOW (ref >59)

## 2022-10-26 LAB — HEPATIC FUNCTION PANEL
ALT: 36 IU/L (ref 0–44)
AST: 36 IU/L (ref 0–40)
Albumin: 4.5 g/dL (ref 3.9–4.9)
Alkaline Phosphatase: 71 IU/L (ref 44–121)
Bilirubin Total: 0.7 mg/dL (ref 0.0–1.2)
Bilirubin, Direct: 0.26 mg/dL (ref 0.00–0.40)
Total Protein: 6.4 g/dL (ref 6.0–8.5)

## 2022-10-26 LAB — LIPID PANEL
Chol/HDL Ratio: 1.9 ratio (ref 0.0–5.0)
Cholesterol, Total: 154 mg/dL (ref 100–199)
HDL: 79 mg/dL (ref >39)
LDL Chol Calc (NIH): 58 mg/dL (ref 0–99)
Triglycerides: 95 mg/dL (ref 0–149)
VLDL Cholesterol Cal: 17 mg/dL (ref 5–40)

## 2022-10-26 LAB — CBC
Hematocrit: 51.1 % — ABNORMAL HIGH (ref 37.5–51.0)
Hemoglobin: 17.7 g/dL (ref 13.0–17.7)
MCH: 31 pg (ref 26.6–33.0)
MCHC: 34.6 g/dL (ref 31.5–35.7)
MCV: 90 fL (ref 79–97)
Platelets: 179 10*3/uL (ref 150–450)
RBC: 5.71 x10E6/uL (ref 4.14–5.80)
RDW: 13.5 % (ref 11.6–15.4)
WBC: 3.5 10*3/uL (ref 3.4–10.8)

## 2022-10-26 LAB — TSH: TSH: 2.01 u[IU]/mL (ref 0.450–4.500)

## 2022-10-26 LAB — VITAMIN D 25 HYDROXY (VIT D DEFICIENCY, FRACTURES): Vit D, 25-Hydroxy: 58.7 ng/mL (ref 30.0–100.0)

## 2022-10-26 LAB — HEMOGLOBIN A1C
Est. average glucose Bld gHb Est-mCnc: 180 mg/dL
Hgb A1c MFr Bld: 7.9 % — ABNORMAL HIGH (ref 4.8–5.6)

## 2022-10-27 ENCOUNTER — Other Ambulatory Visit: Payer: Self-pay | Admitting: Cardiology

## 2022-10-29 ENCOUNTER — Telehealth: Payer: Self-pay

## 2022-10-29 NOTE — Telephone Encounter (Signed)
-----   Message from Aundra Dubin Revankar sent at 10/26/2022  8:12 AM EDT ----- Kidney function is stable.  Mildly elevated.  Hemoglobin A1c is markedly elevated.  Contact primary doctor.  Otherwise the results of the study is unremarkable. Please inform patient. I will discuss in detail at next appointment. Cc  primary care/referring physician Garwin Brothers, MD 10/26/2022 8:11 AM

## 2022-10-29 NOTE — Telephone Encounter (Signed)
Patient called.  Left message for patient to call back.

## 2022-10-30 ENCOUNTER — Ambulatory Visit: Payer: Medicare Other | Attending: Cardiology

## 2022-10-30 DIAGNOSIS — E119 Type 2 diabetes mellitus without complications: Secondary | ICD-10-CM

## 2022-10-30 DIAGNOSIS — N1831 Chronic kidney disease, stage 3a: Secondary | ICD-10-CM

## 2022-10-30 DIAGNOSIS — I4729 Other ventricular tachycardia: Secondary | ICD-10-CM | POA: Diagnosis not present

## 2022-10-30 DIAGNOSIS — I2510 Atherosclerotic heart disease of native coronary artery without angina pectoris: Secondary | ICD-10-CM

## 2022-10-30 DIAGNOSIS — E782 Mixed hyperlipidemia: Secondary | ICD-10-CM

## 2022-10-30 DIAGNOSIS — I10 Essential (primary) hypertension: Secondary | ICD-10-CM | POA: Diagnosis not present

## 2022-10-30 LAB — ECHOCARDIOGRAM COMPLETE: S' Lateral: 3 cm

## 2022-10-31 ENCOUNTER — Other Ambulatory Visit: Payer: Self-pay | Admitting: Cardiology

## 2022-11-01 ENCOUNTER — Telehealth: Payer: Self-pay | Admitting: *Deleted

## 2022-11-01 ENCOUNTER — Other Ambulatory Visit: Payer: Self-pay | Admitting: Cardiology

## 2022-11-01 NOTE — Telephone Encounter (Signed)
   Pre-operative Risk Assessment    Patient Name: Clarence Schneider  DOB: [birth year] MRN: [id number]      Request for Surgical Clearance    Procedure:   PROSTATE BIOPSY   Date of Surgery:  Clearance TBD                                  Surgeon's Group or Practice Name:  [facility]  Phone number:  [phone] Fax number:  [phone]   Type of Clearance Requested:   - Pharmacy:  Hold Aspirin and Clopidogrel (Plavix)     Type of Anesthesia:  General    Additional requests/questions:   N/A   Freddy Jaksch   11/01/2022, 3:47 PM

## 2022-11-05 NOTE — Telephone Encounter (Addendum)
   Patient Name: Clarence Schneider  DOB: October 09, 1950 MRN: [id number]  Primary Cardiologist: None  Chart reviewed as part of pre-operative protocol coverage. in the office on 10/25/2022 by Dr. Tomie China.  Per Dr. Tomie China, [name] is at acceptable risk for the planned procedure without further cardiovascular testing.   Per office protocol, he may hold Plavix for 5 days prior to procedure. Please resume Plavix as soon as possible postprocedure, at the discretion of the surgeon. Regarding ASA therapy, we recommend continuation of ASA throughout the perioperative period.  However, if the surgeon feels that cessation of ASA is required in the perioperative period, it may be stopped 5-7 days prior to surgery with a plan to resume it as soon as felt to be feasible from a surgical standpoint in the post-operative period.   I will route this recommendation as well as the note from patient's most recent office visit to the requesting party via Epic fax function and remove from pre-op pool.  Please call with questions.  Joylene Grapes, NP 11/05/2022, 12:22 PM

## 2022-12-14 ENCOUNTER — Other Ambulatory Visit: Payer: Self-pay | Admitting: Cardiology

## 2023-01-10 ENCOUNTER — Inpatient Hospital Stay
Admission: RE | Admit: 2023-01-10 | Discharge: 2023-01-10 | Disposition: A | Discharge status: Home / Self Care | Payer: Self-pay | Source: Ambulatory Visit | Attending: Radiation Oncology | Admitting: Radiation Oncology

## 2023-01-10 ENCOUNTER — Other Ambulatory Visit: Payer: Self-pay | Admitting: Radiation Oncology

## 2023-01-10 DIAGNOSIS — C61 Malignant neoplasm of prostate: Secondary | ICD-10-CM

## 2023-01-14 ENCOUNTER — Telehealth: Payer: Self-pay | Admitting: Radiation Oncology

## 2023-01-14 NOTE — Telephone Encounter (Signed)
Left message for patient to call back to schedule consult per 9/25 referral.

## 2023-01-28 ENCOUNTER — Encounter: Payer: Self-pay | Admitting: Urology

## 2023-01-28 DIAGNOSIS — C61 Malignant neoplasm of prostate: Secondary | ICD-10-CM

## 2023-01-28 HISTORY — DX: Malignant neoplasm of prostate: C61

## 2023-01-28 NOTE — Progress Notes (Signed)
GU Location of Tumor / Histology: Prostate cancer  If Prostate Cancer, Gleason Score is (3 + 4) and PSA is (6.4 on 11/03/2022)  Biopsies    Past/Anticipated interventions by urology, if any: NA  Past/Anticipated interventions by medical oncology, if any: NA  Weight changes, if any: {:18581}  IPSS: SHIM:  Bowel/Bladder complaints, if any: {:18581}   Nausea/Vomiting, if any: {:18581}  Pain issues, if any:  {:18581}  SAFETY ISSUES: Prior radiation? {:18581} Pacemaker/ICD? {:18581} Possible current pregnancy? Male Is the patient on methotrexate? No  Current Complaints / other details:

## 2023-01-29 ENCOUNTER — Ambulatory Visit
Admission: RE | Admit: 2023-01-29 | Discharge: 2023-01-29 | Disposition: A | Discharge status: Home / Self Care | Payer: Medicare Other | Source: Ambulatory Visit | Attending: Radiation Oncology | Admitting: Radiation Oncology

## 2023-01-29 ENCOUNTER — Other Ambulatory Visit: Payer: Self-pay

## 2023-01-29 ENCOUNTER — Encounter: Payer: Self-pay | Admitting: Radiation Oncology

## 2023-01-29 VITALS — BP 132/81 | HR 50 | Temp 97.3°F | Resp 18 | Ht 71.0 in | Wt 206.5 lb

## 2023-01-29 DIAGNOSIS — C61 Malignant neoplasm of prostate: Secondary | ICD-10-CM

## 2023-01-29 NOTE — Progress Notes (Signed)
Radiation Oncology         [phone] ________________________________  Initial Outpatient Consultation  Name: Clarence Schneider MRN: [id number]  Date: 01/29/2023  DOB: [birth year]  OZ:[id number], Clarence Cheadle, MD  [name], MD   REFERRING PHYSICIAN: [name], MD  DIAGNOSIS: 72 y.o. gentleman with Stage T1c adenocarcinoma of the prostate with Gleason score of 3+4, and PSA of 6.4.    ICD-10-CM   1. Malignant neoplasm of prostate (HCC)  C61       HISTORY OF PRESENT ILLNESS: Clarence Schneider is a 72 y.o. male with a diagnosis of prostate cancer. He has been followed by Dr. Saddie Benders in urology for a history of BPH with LUTS and glucosuria. At follow up on 10/30/22, he was found to have an elevated PSA of 6.4, digital rectal examination performed at that time showed no nodules or induration. His previous PSA from 06/2021 was WNL at 2.16. The patient proceeded to transrectal ultrasound with 12 biopsies of the prostate on 11/22/22.  The prostate volume measured 45 cc.  Out of 12 core biopsies, 2 were positive.  The maximum Gleason score was 3+4, and this was seen in the left lateral apex. Additionally, Gleason 3+3 was seen in the left lateral mid.  He underwent staging CT A/P on 12/05/22 showing a prominent, but not pathologically enlarged, right external iliac node without evidence of metastatic disease.  The patient reviewed the biopsy and imaging results with his urologist and he has kindly been referred today for discussion of potential radiation treatment options.   PREVIOUS RADIATION THERAPY: No  PAST MEDICAL HISTORY:  Past Medical History:  Diagnosis Date   Acute non-recurrent sinusitis 09/02/2015   Formatting of this note might be different from the original. 2018: eth 03/28/2020   Adenomatous polyp of colon 03/20/2016   Overview:  2007  Formatting of this note might be different from the original. 2007   Allergic rhinitis 03/19/2016   Arthritis    Arthritis of right knee  03/26/2013   Back strain 07/05/2017   2019: right   Benign essential hypertension 12/27/2014   Formatting of this note might be different from the original. 1997: dx 2002: rx   Bladder disorder 05/21/2016   CAD S/P percutaneous coronary angioplasty 06/21/2017   Chronic frontal sinusitis 10/23/2017   2019  Formatting of this note might be different from the original. 2019   Chronic lumbar pain 03/19/2016   Chronic pain of both knees 07/12/2016   Chronic right shoulder pain 07/12/2016   Coronary artery disease    stents   Coronary artery disease involving native coronary artery of native heart 03/19/2016   Formatting of this note might be different from the original. 2005: onset sx 2005: cath diffuse disease, dissection/occlusion RCA, stent   DDD (degenerative disc disease), cervical 07/18/2021   DDD (degenerative disc disease), lumbar 07/18/2021   Dizziness    Enlarged prostate with lower urinary tract symptoms (LUTS) 01/08/2022   Formatting of this note might be different from the original. 06/2021: cysto Formatting of this note might be different from the original. 06/2021: cysto   Facet arthropathy, cervical 07/18/2021   Facet arthropathy, lumbar 07/18/2021   Gastroesophageal reflux disease 02/16/2020   Herpes zoster without complication 10/05/2019   Formatting of this note might be different from the original. 2021: right T7, no rash   History of GI bleed 01/07/2018   Formatting of this note might be different from the original. 2019: melena, GI eval, continue ASA  Hyperlipemia    Hypertension    Hypotestosteronism 05/27/2015   Impingement syndrome of left shoulder 06/03/2014   Impotence 05/26/2015   Intolerance of continuous positive airway pressure (CPAP) ventilation 04/18/2021   Lateral epicondylitis of right elbow 01/04/2016   Overview:  2017  Formatting of this note might be different from the original. 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 1998: LDL 187 TG 317   Neck pain 07/18/2021   Neuromuscular disorder (HCC)    Takes Gabapentin   Nonsustained ventricular tachycardia (HCC) 06/14/2017   Obstructive sleep apnea 03/19/2016   Overview:  2012: dx, AHI 69 with desaturation, BiPAP  Formatting of this note might be different from the original. Split-night sleep study 10/25/2017, AHI 63.8, CPAP 20   OSA treated with BiPAP 03/19/2016   Overview:  2012: dx, AHI 69 with desaturation, BiPAP  Formatting of this note might be different from the original. Split-night sleep study 10/25/2017, AHI 63.8, CPAP 20  Formatting of this note might be different from the original. Titration 01/13/2020 BiPAP 25/20 Split-night sleep study 10/25/2017, AHI 63.8, CPAP 20   Peripheral arterial disease (HCC) 03/19/2016   Pinched nerve    in back   Pre-diabetes 06/18/2016   S/P arthroscopy of right shoulder 06/28/2014   S/P total knee arthroplasty 05/11/2013   S/P total knee replacement using cement 11/30/2013   Screening for colon cancer 03/19/2016   Screening for prostate cancer 03/19/2016   Sebaceous cyst 05/26/2019   Formatting of this note might be different from the original. 2021: post neck excision   Shortness of breath    with exertion   Sinusitis 09/02/2015   Overview:  2018:   Overview:  2018: eth   Sinusitis, acute ethmoidal 09/02/2015   Formatting of this note might be different from the original. 2018: eth 03/28/2020   Skin abrasion    under both arms; uses Lotrisone cream twice a day   Sleep apnea    cpap doesnt use reg   Slow heart rate 06/14/2017   Stage 3a chronic kidney disease (HCC) 03/28/2020   Subacromial bursitis of right shoulder joint 01/04/2016    Overview:  2017  Formatting of this note might be different from the original. 2017   Trigeminal neuralgia 03/19/2016   Trigger ring finger of left hand 05/29/2022   Formatting of this note might be different from the original. 05/29/2022 Formatting of this note might be different from the original. 05/29/2022   Type 2 diabetes mellitus without complication, without long-term current use of insulin (HCC) 06/18/2016   Formatting of this note might be different from the original. 06/23/2020 : 132/6.9   Urinary incontinence 04/21/2021   Formatting of this note might be different from the original. 04/21/2021:   Verrucous skin lesion 03/28/2020   Formatting of this note might be different from the original. 03/28/2020 : right deltoid region      PAST SURGICAL HISTORY: Past Surgical History:  Procedure Laterality Date   APPENDECTOMY     CARDIAC CATHETERIZATION     X 3 stents   CARPAL TUNNEL RELEASE Bilateral    COLONOSCOPY W/ POLYPECTOMY     CORONARY BALLOON ANGIOPLASTY N/A 06/21/2017  Procedure: CORONARY BALLOON ANGIOPLASTY;  Surgeon: Corky Crafts, MD;  Location: [facility] INVASIVE CV LAB;  Service: Cardiovascular;  Laterality: N/A;   ELBOW ARTHROSCOPY Bilateral    ulner nerve   2 surgeries   INGUINAL HERNIA REPAIR Right    KNEE ARTHROSCOPY Bilateral    Left x 1 Right X 2   LEFT HEART CATH AND CORONARY ANGIOGRAPHY N/A 06/21/2017   Procedure: LEFT HEART CATH AND CORONARY ANGIOGRAPHY;  Surgeon: Corky Crafts, MD;  Location: [facility] INVASIVE CV LAB;  Service: Cardiovascular;  Laterality: N/A;   SHOULDER ARTHROSCOPY Left    TONSILLECTOMY     TOTAL KNEE ARTHROPLASTY Left 05/11/2013   Procedure: LEFT TOTAL KNEE ARTHROPLASTY;  Surgeon: Dannielle Huh, MD;  Location: [facility] OR;  Service: Orthopedics;  Laterality: Left;   TOTAL KNEE ARTHROPLASTY Right 11/30/2013   dr Sherlean Foot   TOTAL KNEE ARTHROPLASTY Right 11/30/2013   Procedure: TOTAL KNEE ARTHROPLASTY;  Surgeon: Dannielle Huh, MD;  Location: [facility] OR;  Service:  Orthopedics;  Laterality: Right;    FAMILY HISTORY:  Family History  Problem Relation Age of Onset   Heart attack Brother    COPD Brother    Hypertension Mother    Hypertension Father    Diabetes Father    Hyperlipidemia Father    Cancer Father    Cancer Brother    Prostate cancer Brother     SOCIAL HISTORY:  Social History   Socioeconomic History   Marital status: Married    Spouse name: Not on file   Number of children: Not on file   Years of education: Not on file   Highest education level: Not on file  Occupational History   Not on file  Tobacco Use   Smoking status: Never   Smokeless tobacco: Never  Vaping Use   Vaping status: Never Used  Substance and Sexual Activity   Alcohol use: No   Drug use: No   Sexual activity: Not on file  Other Topics Concern   Not on file  Social History Narrative   Not on file   Social Determinants of Health   Financial Resource Strain: Low Risk  (01/07/2018)   Received from [facility] visits prior to 06/16/2022., [facility] visits prior to 06/16/2022.   Overall Financial Resource Strain (CARDIA)    Difficulty of Paying Living Expenses: Not hard at all  Food Insecurity: No Food Insecurity (01/29/2023)   Hunger Vital Sign    Worried About Running Out of Food in the Last Year: Never true    Ran Out of Food in the Last Year: Never true  Transportation Needs: No Transportation Needs (01/29/2023)   PRAPARE - Administrator, Civil Service (Medical): No    Lack of Transportation (Non-Medical): No  Physical Activity: Sufficiently Active (01/07/2018)   Received from [facility] visits prior to 06/16/2022., [facility] visits prior to 06/16/2022.   Exercise Vital Sign    Days of Exercise per Week: 3 days    Minutes of Exercise per Session: 60 min  Stress: No Stress Concern Present (01/07/2018)   Received from [facility]  visits prior to 06/16/2022., [facility] visits prior to 06/16/2022.   [employer] of Occupational Health - Occupational Stress Questionnaire    Feeling of Stress : Not at all  Social Connections: Socially Integrated (01/07/2018)   [facility] prior to  06/16/2022., [facility] visits prior to 06/16/2022.   Social Advertising account executive [NHANES]    Frequency of Communication with Friends and Family: Twice a week    Frequency of Social Gatherings with Friends and Family: Twice a week    Attends Religious Services: 1 to 4 times per year    Active Member of [employer] or Organizations: Yes    Attends Banker Meetings: 1 to 4 times per year    Marital Status: Married  Catering manager Violence: Not At Risk (01/29/2023)   Humiliation, Afraid, Rape, and Kick questionnaire    Fear of Current or Ex-Partner: No    Emotionally Abused: No    Physically Abused: No    Sexually Abused: No    ALLERGIES: Oxycontin [oxycodone hcl], Metoclopramide, Oxycodone, and Tape  MEDICATIONS:  Current Outpatient Medications  Medication Sig Dispense Refill   ascorbic acid (VITAMIN C) 1000 MG tablet Take 1,000 mg by mouth daily.     aspirin 81 MG EC tablet Take 81 mg by mouth daily.      carvedilol (COREG) 12.5 MG tablet Take 1 tablet (12.5 mg total) by mouth 2 (two) times daily. 180 tablet 3   clopidogrel (PLAVIX) 75 MG tablet Take 1 tablet (75 mg total) by mouth daily. 90 tablet 3   cycloSPORINE (RESTASIS) 0.05 % ophthalmic emulsion Place 1 drop into both eyes daily.     ezetimibe (ZETIA) 10 MG tablet Take 1 tablet (10 mg total) by mouth daily. 90 tablet 3   FARXIGA 10 MG TABS tablet Take 10 mg by mouth daily.     fenofibrate 160 MG tablet TAKE 1 TABLET BY MOUTH EVERY EVENING. 90 tablet 0   fexofenadine (ALLEGRA) 180 MG tablet Take 180 mg by mouth daily.     gabapentin (NEURONTIN) 300 MG capsule Take 300 mg by  mouth 3 (three) times daily as needed for pain.     hydrochlorothiazide (MICROZIDE) 12.5 MG capsule Take 1 capsule (12.5 mg total) by mouth daily. 90 capsule 3   montelukast (SINGULAIR) 10 MG tablet Take 10 mg by mouth daily.     Multiple Vitamins-Minerals (MULTIVITAMIN WITH MINERALS) tablet Take 1 tablet by mouth daily.     nitroGLYCERIN (NITROSTAT) 0.4 MG SL tablet Place 1 tablet (0.4 mg total) under the tongue every 5 (five) minutes as needed for chest pain. 25 tablet 3   omega-3 acid ethyl esters (LOVAZA) 1 g capsule Take 2 capsules (2 g total) by mouth 2 (two) times daily. 360 capsule 3   pyridoxine (B-6) 200 MG tablet Take 200 mg by mouth daily.     rosuvastatin (CRESTOR) 40 MG tablet Take 1 tablet (40 mg total) by mouth daily. 90 tablet 3   sildenafil (REVATIO) 20 MG tablet Take 20 mg by mouth as needed for erectile dysfunction.     tamsulosin (FLOMAX) 0.4 MG CAPS capsule Take 0.4 mg by mouth every evening.     telmisartan (MICARDIS) 80 MG tablet TAKE 1 TABLET BY MOUTH DAILY. 90 tablet 0   testosterone cypionate (DEPOTESTOSTERONE CYPIONATE) 200 MG/ML injection Inject 1.25 mLs into the muscle every 14 (fourteen) days. (Patient not taking: Reported on 01/29/2023)     Tetrahydrozoline-Zn Sulfate (EYE DROPS RELIEF OP) Place 1 drop into both eyes 2 (two) times daily.     triamcinolone (NASACORT) 55 MCG/ACT AERO nasal inhaler Place 2 sprays into the nose daily.     vitamin B-12 (CYANOCOBALAMIN) 1000 MCG tablet Take 1,000 mcg by mouth daily.  No current facility-administered medications for this encounter.    REVIEW OF SYSTEMS:  On review of systems, the patient reports that he is doing well overall. He denies any chest pain, shortness of breath, cough, fevers, chills, night sweats, unintended weight changes. He denies any bowel disturbances, and denies abdominal pain, nausea or vomiting. He denies any new musculoskeletal or joint aches or pains. His IPSS was 5, indicating mild urinary  symptoms. His SHIM was 21, indicating he has mild erectile dysfunction. A complete review of systems is obtained and is otherwise negative.    PHYSICAL EXAM:  Wt Readings from Last 3 Encounters:  01/29/23 206 lb 8 oz (93.7 kg)  10/25/22 209 lb (94.8 kg)  09/18/21 213 lb 6.4 oz (96.8 kg)   Temp Readings from Last 3 Encounters:  01/29/23 (!) 97.3 F (36.3 C) (Temporal)  06/21/17 98 F (36.7 C) (Oral)  12/01/13 99.8 F (37.7 C) (Oral)   BP Readings from Last 3 Encounters:  01/29/23 132/81  10/25/22 104/80  09/18/21 136/84   Pulse Readings from Last 3 Encounters:  01/29/23 (!) 50  10/25/22 (!) 46  09/18/21 (!) 58   Pain Assessment Pain Score: 0-No pain/10  In general this is a well appearing African American male in no acute distress. He's alert and oriented x4 and appropriate throughout the examination. Cardiopulmonary assessment is negative for acute distress, and he exhibits normal effort.     KPS = 100  100 - Normal; no complaints; no evidence of disease. 90   - Able to carry on normal activity; minor signs or symptoms of disease. 80   - Normal activity with effort; some signs or symptoms of disease. 75   - Cares for self; unable to carry on normal activity or to do active work. 60   - Requires occasional assistance, but is able to care for most of his personal needs. 50   - Requires considerable assistance and frequent medical care. 40   - Disabled; requires special care and assistance. 30   - Severely disabled; hospital admission is indicated although death not imminent. 20   - Very sick; hospital admission necessary; active supportive treatment necessary. 10   - Moribund; fatal processes progressing rapidly. 0     - Dead  Karnofsky DA, Abelmann WH, Craver LS and Burchenal [facility] [phone]) The use of the nitrogen mustards in the palliative treatment of carcinoma: with particular reference to bronchogenic carcinoma Cancer 1 634-56  LABORATORY DATA:  Lab Results  Component  Value Date   WBC 3.5 10/25/2022   HGB 17.7 10/25/2022   HCT 51.1 (H) 10/25/2022   MCV 90 10/25/2022   PLT 179 10/25/2022   Lab Results  Component Value Date   NA 139 10/25/2022   K 4.1 10/25/2022   CL 100 10/25/2022   CO2 27 10/25/2022   Lab Results  Component Value Date   ALT 36 10/25/2022   AST 36 10/25/2022   ALKPHOS 71 10/25/2022   BILITOT 0.7 10/25/2022     RADIOGRAPHY: No results found.    IMPRESSION/PLAN: 1. 72 y.o. gentleman with Stage T1c adenocarcinoma of the prostate with Gleason Score of 3+4, and PSA of 6.4. We discussed the patient's workup and outlined the nature of prostate cancer in this setting. The patient's T stage, Gleason's score, and PSA put him into the favorable intermediate risk group. Accordingly, he is eligible for a variety of potential treatment options including brachytherapy, 5.5 weeks of external radiation, or prostatectomy. We discussed the available  radiation techniques, and focused on the details and logistics of delivery. We discussed and outlined the risks, benefits, short and long-term effects associated with radiotherapy and compared and contrasted these with prostatectomy. We discussed the role of SpaceOAR gel in reducing the rectal toxicity associated with radiotherapy.  He appears to have a good understanding of his disease and our treatment recommendations which are of curative intent.  He was encouraged to ask questions that were answered to his stated satisfaction.  At the conclusion of our conversation, the patient is interested in moving forward with brachytherapy, first available.  We will share our discussion with Dr. Saddie Benders and move forward with scheduling his CT [facility] planning appointment in the near future.  The patient met briefly with Darryl Nestle in our office who will be working closely with him to coordinate OR scheduling and pre and post procedure appointments.  We enjoyed meeting him and him wife today and look forward to continuing  to participate in his care.  We personally spent 70 minutes in this encounter including chart review, reviewing radiological studies, meeting face-to-face with the patient, entering orders and completing documentation.    Marguarite Arbour, PA-C    Margaretmary Dys, MD  [facility]  Radiation Oncology Direct Dial: [phone]  Fax: [phone] [email or web address]  Skype  LinkedIn   This document serves as a record of services personally performed by Margaretmary Dys, MD and Marcello Fennel, PA-C. It was created on their behalf by Mickie Bail, a trained medical scribe. The creation of this record is based on the scribe's personal observations and the provider's statements to them. This document has been checked and approved by the attending provider.

## 2023-01-29 NOTE — Progress Notes (Signed)
Introduced myself to the patient, and his wife, as the prostate nurse navigator.  No barriers to care identified at this time.  He is here to discuss his radiation treatment options and will plan to proceed with brachytherapy.  I gave him my business card and asked him to call me with questions or concerns.  Verbalized understanding.

## 2023-01-30 ENCOUNTER — Telehealth: Payer: Self-pay | Admitting: *Deleted

## 2023-01-30 NOTE — Telephone Encounter (Signed)
CALLED PATIENT TO INFORM OF PRE-SEED APPTS. AND IMPLANT DATE, SPOKE WITH PATIENT'S WIFE- JUDY AND SHE IS AWARE OF THESE APPTS.

## 2023-01-31 ENCOUNTER — Telehealth: Payer: Self-pay | Admitting: *Deleted

## 2023-01-31 NOTE — Telephone Encounter (Signed)
RETURNED PATIENT'S PHONE CALL, SPOKE WITH PATIENT. ?

## 2023-03-01 NOTE — Progress Notes (Signed)
RN spoke with patient's wife and provided my contact information for patient to follow up with me if he had any questions or concerns prior to upcoming brachytherapy.

## 2023-03-05 ENCOUNTER — Telehealth: Payer: Self-pay | Admitting: *Deleted

## 2023-03-05 NOTE — Telephone Encounter (Signed)
CALLED PATIENT TO REMIND OF PRE-SEED APPTS. FOR 03-07-23, SPOKE WITH PATIENT'S WIFE- JUDY AND SHE IS AWARE OF THESE APPTS.

## 2023-03-06 NOTE — Progress Notes (Signed)
Radiation Oncology         [phone] ________________________________  Outpatient Follow up- Pre-seed visit  Name: Clarence Schneider MRN: [id number]  Date: 03/07/2023  DOB: Feb 02, 1951  DG:[id number], Clarence Cheadle, MD  [name], MD   REFERRING PHYSICIAN: [name], MD  DIAGNOSIS: 72 y.o. gentleman with Stage T1c adenocarcinoma of the prostate with Gleason score of 3+4, and PSA of 6.4.     ICD-10-CM   1. Malignant neoplasm of prostate (HCC)  C61       HISTORY OF PRESENT ILLNESS: Clarence Schneider is a 72 y.o. male with a diagnosis of prostate cancer.  He has been followed by Dr. Saddie Benders in urology for a history of BPH with LUTS and glucosuria. At follow up on 10/30/22, he was found to have an elevated PSA of 6.4, digital rectal examination performed at that time showed no nodules or induration. His previous PSA from 06/2021 was WNL at 2.16. The patient proceeded to transrectal ultrasound with 12 biopsies of the prostate on 11/22/22.  The prostate volume measured 45 cc.  Out of 12 core biopsies, 2 were positive.  The maximum Gleason score was 3+4, and this was seen in the left lateral apex. Additionally, Gleason 3+3 was seen in the left lateral mid.   He underwent staging CT A/P on 12/05/22 showing a prominent, but not pathologically enlarged, right external iliac node without evidence of metastatic disease.  The patient reviewed the biopsy results with his urologist and was kindly referred to [place] for discussion of potential radiation treatment options. We initially met the patient on 01/29/23 and he was most interested in proceeding with brachytherapy for treatment of his disease. He is here today for his pre-procedure imaging for planning and to answer any additional questions he may have about this treatment.   PREVIOUS RADIATION THERAPY: No  PAST MEDICAL HISTORY:  Past Medical History:  Diagnosis Date   Acute non-recurrent sinusitis 09/02/2015   Formatting of this note might be  different from the original. 2018: eth 03/28/2020   Adenomatous polyp of colon 03/20/2016   Overview:  2007  Formatting of this note might be different from the original. 2007   Allergic rhinitis 03/19/2016   Arthritis    Arthritis of right knee 03/26/2013   Back strain 07/05/2017   2019: right   Benign essential hypertension 12/27/2014   Formatting of this note might be different from the original. 1997: dx 2002: rx   Bladder disorder 05/21/2016   CAD S/P percutaneous coronary angioplasty 06/21/2017   Chronic frontal sinusitis 10/23/2017   2019  Formatting of this note might be different from the original. 2019   Chronic lumbar pain 03/19/2016   Chronic pain of both knees 07/12/2016   Chronic right shoulder pain 07/12/2016   Coronary artery disease    stents   Coronary artery disease involving native coronary artery of native heart 03/19/2016   Formatting of this note might be different from the original. 2005: onset sx 2005: cath diffuse disease, dissection/occlusion RCA, stent   DDD (degenerative disc disease), cervical 07/18/2021   DDD (degenerative disc disease), lumbar 07/18/2021   Dizziness    Enlarged prostate with lower urinary tract symptoms (LUTS) 01/08/2022   Formatting of this note might be different from the original. 06/2021: cysto Formatting of this note might be different from the original. 06/2021: cysto   Facet arthropathy, cervical 07/18/2021   Facet arthropathy, lumbar 07/18/2021   Gastroesophageal reflux disease 02/16/2020   Herpes zoster without complication  10/05/2019   Formatting of this note might be different from the original. 2021: right T7, no rash   History of GI bleed 01/07/2018   Formatting of this note might be different from the original. 2019: melena, GI eval, continue ASA   Hyperlipemia    Hypertension    Hypotestosteronism 05/27/2015   Impingement syndrome of left shoulder 06/03/2014   Impotence 05/26/2015   Intolerance of continuous positive  airway pressure (CPAP) ventilation 04/18/2021   Lateral epicondylitis of right elbow 01/04/2016   Overview:  2017  Formatting of this note might be different from the original. 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 1998: LDL 187 TG 317   Neck pain 07/18/2021   Neuromuscular disorder (HCC)    Takes Gabapentin   Nonsustained ventricular tachycardia (HCC) 06/14/2017   Obstructive sleep apnea 03/19/2016   Overview:  2012: dx, AHI 69 with desaturation, BiPAP  Formatting of this note might be different from the original. Split-night sleep study 10/25/2017, AHI 63.8, CPAP 20   OSA treated with BiPAP 03/19/2016   Overview:  2012: dx, AHI 69 with desaturation, BiPAP  Formatting of this note might be different from the original. Split-night sleep study 10/25/2017, AHI 63.8, CPAP 20  Formatting of this note might be different from the original. Titration 01/13/2020 BiPAP 25/20 Split-night sleep study 10/25/2017, AHI 63.8, CPAP 20   Peripheral arterial disease (HCC) 03/19/2016   Pinched nerve    in back   Pre-diabetes 06/18/2016   S/P arthroscopy of right shoulder 06/28/2014   S/P total knee arthroplasty 05/11/2013   S/P total knee replacement using cement 11/30/2013   Screening for colon cancer 03/19/2016   Screening for prostate cancer 03/19/2016   Sebaceous cyst 05/26/2019   Formatting of this note might be different from the original. 2021: post neck excision   Shortness of breath    with exertion   Sinusitis 09/02/2015   Overview:  2018:   Overview:  2018: eth   Sinusitis, acute ethmoidal 09/02/2015   Formatting of this note might be different from the original. 2018: eth 03/28/2020   Skin  abrasion    under both arms; uses Lotrisone cream twice a day   Sleep apnea    cpap doesnt use reg   Slow heart rate 06/14/2017   Stage 3a chronic kidney disease (HCC) 03/28/2020   Subacromial bursitis of right shoulder joint 01/04/2016   Overview:  2017  Formatting of this note might be different from the original. 2017   Trigeminal neuralgia 03/19/2016   Trigger ring finger of left hand 05/29/2022   Formatting of this note might be different from the original. 05/29/2022 Formatting of this note might be different from the original. 05/29/2022   Type 2 diabetes mellitus without complication, without long-term current use of insulin (HCC) 06/18/2016   Formatting of this note might be different from the original. 06/23/2020 : 132/6.9   Urinary incontinence 04/21/2021   Formatting of this note might be different from the original. 04/21/2021:   Verrucous skin lesion 03/28/2020   Formatting of this note might be different from the original. 03/28/2020 : right deltoid region      PAST SURGICAL HISTORY: Past  Surgical History:  Procedure Laterality Date   APPENDECTOMY     CARDIAC CATHETERIZATION     X 3 stents   CARPAL TUNNEL RELEASE Bilateral    COLONOSCOPY W/ POLYPECTOMY     CORONARY BALLOON ANGIOPLASTY N/A 06/21/2017   Procedure: CORONARY BALLOON ANGIOPLASTY;  Surgeon: Corky Crafts, MD;  Location: [facility] INVASIVE CV LAB;  Service: Cardiovascular;  Laterality: N/A;   ELBOW ARTHROSCOPY Bilateral    ulner nerve   2 surgeries   INGUINAL HERNIA REPAIR Right    KNEE ARTHROSCOPY Bilateral    Left x 1 Right X 2   LEFT HEART CATH AND CORONARY ANGIOGRAPHY N/A 06/21/2017   Procedure: LEFT HEART CATH AND CORONARY ANGIOGRAPHY;  Surgeon: Corky Crafts, MD;  Location: [facility] INVASIVE CV LAB;  Service: Cardiovascular;  Laterality: N/A;   SHOULDER ARTHROSCOPY Left    TONSILLECTOMY     TOTAL KNEE ARTHROPLASTY Left 05/11/2013   Procedure: LEFT TOTAL KNEE ARTHROPLASTY;  Surgeon: Dannielle Huh, MD;   Location: [facility] OR;  Service: Orthopedics;  Laterality: Left;   TOTAL KNEE ARTHROPLASTY Right 11/30/2013   dr Sherlean Foot   TOTAL KNEE ARTHROPLASTY Right 11/30/2013   Procedure: TOTAL KNEE ARTHROPLASTY;  Surgeon: Dannielle Huh, MD;  Location: [facility] OR;  Service: Orthopedics;  Laterality: Right;    FAMILY HISTORY:  Family History  Problem Relation Age of Onset   Heart attack Brother    COPD Brother    Hypertension Mother    Hypertension Father    Diabetes Father    Hyperlipidemia Father    Cancer Father    Cancer Brother    Prostate cancer Brother     SOCIAL HISTORY:  Social History   Socioeconomic History   Marital status: Married    Spouse name: Not on file   Number of children: Not on file   Years of education: Not on file   Highest education level: Not on file  Occupational History   Not on file  Tobacco Use   Smoking status: Never   Smokeless tobacco: Never  Vaping Use   Vaping status: Never Used  Substance and Sexual Activity   Alcohol use: No   Drug use: No   Sexual activity: Not on file  Other Topics Concern   Not on file  Social History Narrative   Not on file   Social Determinants of Health   Financial Resource Strain: Low Risk  (01/07/2018)   [facility] from [facility] prior to 06/16/2022., [facility] visits prior to 06/16/2022.   Overall Financial Resource Strain (CARDIA)    Difficulty of Paying Living Expenses: Not hard at all  Food Insecurity: No Food Insecurity (01/29/2023)   Hunger Vital Sign    Worried About Running Out of Food in the Last Year: Never true    Ran Out of Food in the Last Year: Never true  Transportation Needs: No Transportation Needs (01/29/2023)   PRAPARE - Administrator, Civil Service (Medical): No    Lack of Transportation (Non-Medical): No  Physical Activity: Sufficiently Active (01/07/2018)   [facility] visits prior to 06/16/2022., [facility] visits prior to 06/16/2022.   Exercise Vital Sign    Days of Exercise per Week: 3 days    Minutes of Exercise per Session: 60 min  Stress: No Stress Concern Present (01/07/2018)   [facility] visits prior to 06/16/2022., [facility] visits prior to 06/16/2022.   [employer] of Occupational Health - Occupational Stress Questionnaire    Feeling of Stress : Not at all  Social Connections: Socially Integrated (01/07/2018)   Received from [facility] visits prior to 06/16/2022., [facility] visits prior to 06/16/2022.   Social Advertising account executive [NHANES]    Frequency of Communication with Friends and Family: Twice a week    Frequency of Social Gatherings with Friends and Family: Twice a week    Attends Religious Services: 1 to 4 times per year    Active Member of [employer] or Organizations: Yes    Attends Banker Meetings: 1 to 4 times per year    Marital Status: Married  Catering manager Violence: Not At Risk (01/29/2023)   Humiliation, Afraid, Rape, and Kick questionnaire    Fear of Current or Ex-Partner: No    Emotionally Abused: No    Physically Abused: No    Sexually Abused: No    ALLERGIES: Oxycontin [oxycodone hcl], Metoclopramide, Oxycodone, and Tape  MEDICATIONS:  Current Outpatient Medications  Medication Sig Dispense Refill   ascorbic acid (VITAMIN C) 1000 MG tablet Take 1,000 mg by mouth daily.     aspirin 81 MG EC tablet Take 81 mg by mouth daily.      carvedilol (COREG) 12.5 MG tablet Take 1 tablet (12.5 mg total) by mouth 2 (two) times daily. 180 tablet 3   clopidogrel (PLAVIX) 75 MG tablet Take 1 tablet (75 mg total) by mouth daily. 90 tablet 3   cycloSPORINE (RESTASIS) 0.05 % ophthalmic emulsion Place 1 drop into both eyes daily.     ezetimibe (ZETIA) 10 MG tablet Take 1 tablet (10 mg total) by mouth daily. 90 tablet 3    FARXIGA 10 MG TABS tablet Take 10 mg by mouth daily.     fenofibrate 160 MG tablet TAKE 1 TABLET BY MOUTH EVERY EVENING. 90 tablet 0   fexofenadine (ALLEGRA) 180 MG tablet Take 180 mg by mouth daily.     gabapentin (NEURONTIN) 300 MG capsule Take 300 mg by mouth 3 (three) times daily as needed for pain.     hydrochlorothiazide (MICROZIDE) 12.5 MG capsule Take 1 capsule (12.5 mg total) by mouth daily. 90 capsule 3   montelukast (SINGULAIR) 10 MG tablet Take 10 mg by mouth daily.     Multiple Vitamins-Minerals (MULTIVITAMIN WITH MINERALS) tablet Take 1 tablet by mouth daily.     nitroGLYCERIN (NITROSTAT) 0.4 MG SL tablet Place 1 tablet (0.4 mg total) under the tongue every 5 (five) minutes as needed for chest pain. 25 tablet 3   omega-3 acid ethyl esters (LOVAZA) 1 g capsule Take 2 capsules (2 g total) by mouth 2 (two) times daily. 360 capsule 3   pyridoxine (B-6) 200 MG tablet Take 200 mg by mouth daily.     rosuvastatin (CRESTOR) 40 MG tablet Take 1 tablet (40 mg total) by mouth daily. 90 tablet 3   sildenafil (REVATIO) 20 MG tablet Take 20 mg by mouth as needed for erectile dysfunction.     tamsulosin (FLOMAX) 0.4 MG CAPS capsule Take 0.4 mg by mouth every evening.     telmisartan (MICARDIS) 80 MG tablet TAKE 1 TABLET BY MOUTH DAILY. 90 tablet 0   testosterone cypionate (DEPOTESTOSTERONE CYPIONATE) 200 MG/ML injection Inject 1.25 mLs into the muscle every 14 (fourteen) days. (Patient not taking: Reported on 01/29/2023)     Tetrahydrozoline-Zn Sulfate (EYE DROPS RELIEF  OP) Place 1 drop into both eyes 2 (two) times daily.     triamcinolone (NASACORT) 55 MCG/ACT AERO nasal inhaler Place 2 sprays into the nose daily.     vitamin B-12 (CYANOCOBALAMIN) 1000 MCG tablet Take 1,000 mcg by mouth daily.     No current facility-administered medications for this visit.    REVIEW OF SYSTEMS:  On review of systems, the patient reports that he is doing well overall. He denies any chest pain, shortness of  breath, cough, fevers, chills, night sweats, unintended weight changes. He denies any bowel disturbances, and denies abdominal pain, nausea or vomiting. He denies any new musculoskeletal or joint aches or pains. His IPSS was 5, indicating mild urinary symptoms. His SHIM was 21, indicating he has mild erectile dysfunction. A complete review of systems is obtained and is otherwise negative.    PHYSICAL EXAM:  Wt Readings from Last 3 Encounters:  01/29/23 206 lb 8 oz (93.7 kg)  10/25/22 209 lb (94.8 kg)  09/18/21 213 lb 6.4 oz (96.8 kg)   Temp Readings from Last 3 Encounters:  01/29/23 (!) 97.3 F (36.3 C) (Temporal)  06/21/17 98 F (36.7 C) (Oral)  12/01/13 99.8 F (37.7 C) (Oral)   BP Readings from Last 3 Encounters:  01/29/23 132/81  10/25/22 104/80  09/18/21 136/84   Pulse Readings from Last 3 Encounters:  01/29/23 (!) 50  10/25/22 (!) 46  09/18/21 (!) 58    /10  In general this is a well appearing African American male in no acute distress. He's alert and oriented x4 and appropriate throughout the examination. Cardiopulmonary assessment is negative for acute distress, and he exhibits normal effort.     KPS = 100  100 - Normal; no complaints; no evidence of disease. 90   - Able to carry on normal activity; minor signs or symptoms of disease. 80   - Normal activity with effort; some signs or symptoms of disease. 51   - Cares for self; unable to carry on normal activity or to do active work. 60   - Requires occasional assistance, but is able to care for most of his personal needs. 50   - Requires considerable assistance and frequent medical care. 40   - Disabled; requires special care and assistance. 30   - Severely disabled; hospital admission is indicated although death not imminent. 20   - Very sick; hospital admission necessary; active supportive treatment necessary. 10   - Moribund; fatal processes progressing rapidly. 0     - Dead  Karnofsky DA, Abelmann WH, Craver  LS and Burchenal [employer] [phone]) The use of the nitrogen mustards in the palliative treatment of carcinoma: with particular reference to bronchogenic carcinoma Cancer 1 634-56  LABORATORY DATA:  Lab Results  Component Value Date   WBC 3.5 10/25/2022   HGB 17.7 10/25/2022   HCT 51.1 (H) 10/25/2022   MCV 90 10/25/2022   PLT 179 10/25/2022   Lab Results  Component Value Date   NA 139 10/25/2022   K 4.1 10/25/2022   CL 100 10/25/2022   CO2 27 10/25/2022   Lab Results  Component Value Date   ALT 36 10/25/2022   AST 36 10/25/2022   ALKPHOS 71 10/25/2022   BILITOT 0.7 10/25/2022     RADIOGRAPHY: No results found.    IMPRESSION/PLAN: 1. 72 y.o. gentleman with Stage T1c adenocarcinoma of the prostate with Gleason score of 3+4, and PSA of 6.4.  The patient has elected to proceed with seed implant  for treatment of his disease. We reviewed the risks, benefits, short and long-term effects associated with brachytherapy.  He appears to have a good understanding of his disease and our treatment recommendations which are of curative intent.  He was encouraged to ask questions that were answered to his stated satisfaction. He is interested in having a PSA drawn today to have a more current baseline value prior to treatment so we have placed the order and will call him with the results as soon as they are available. He has freely signed written consent to proceed today in the office and a copy of this document will be placed in his medical record. His procedure is tentatively scheduled for 04/04/23 in collaboration with Dr. Saddie Benders and we will see him back for his post-procedure visit approximately 3 weeks thereafter. We look forward to continuing to participate in his care. He knows that he is welcome to call with any questions or concerns at any time in the interim.  I personally spent 30 minutes in this encounter including chart review, reviewing radiological studies, meeting face-to-face with the patient,  entering orders and completing documentation.    Marguarite Arbour, MMS, PA-C [facility] Radiation Oncology Physician Assistant Direct Dial: [phone]  Fax: [phone]

## 2023-03-06 NOTE — Progress Notes (Signed)
  Radiation Oncology         [phone] ________________________________  Name: CHRISTOFER CARRISALEZ MRN: [id number]  Date: 03/07/2023  DOB: Apr 22, 1950  SIMULATION AND TREATMENT PLANNING NOTE PUBIC ARCH STUDY  [id number], [name], MD  [name], MD  DIAGNOSIS: 72 y.o. gentleman with Stage T1c adenocarcinoma of the prostate with Gleason score of 3+4, and PSA of 6.4.   Oncology History  Malignant neoplasm of prostate (HCC)  11/22/2022 Cancer Staging   Staging form: Prostate, AJCC 8th Edition - Clinical stage from 11/22/2022: Stage IIB (cT1c, cN0, cM0, PSA: 6.4, Grade Group: 2) - Signed by Marcello Fennel, PA-C on 01/28/2023 Histopathologic type: Adenocarcinoma, NOS Stage prefix: Initial diagnosis Prostate specific antigen (PSA) range: Less than 10 Gleason primary pattern: 3 Gleason secondary pattern: 4 Gleason score: 7 Histologic grading system: 5 grade system Number of biopsy cores examined: 12 Number of biopsy cores positive: 2 Location of positive needle core biopsies: One side   01/28/2023 Initial Diagnosis   Malignant neoplasm of prostate (HCC)       ICD-10-CM   1. Malignant neoplasm of prostate (HCC)  C61       COMPLEX SIMULATION:  The patient presented today for evaluation for possible prostate seed implant. He was brought to the radiation planning suite and placed supine on the CT couch. A 3-dimensional image study set was obtained in upload to the planning computer. There, on each axial slice, I contoured the prostate gland. Then, using three-dimensional radiation planning tools I reconstructed the prostate in view of the structures from the transperineal needle pathway to assess for possible pubic arch interference. In doing so, I did not appreciate any pubic arch interference. Also, the patient's prostate volume was estimated based on the drawn structure. The volume was 45 cc.  Given the pubic arch appearance and prostate volume, patient remains a good candidate to  proceed with prostate seed implant. Today, he freely provided informed written consent to proceed.    PLAN: The patient will undergo prostate seed implant.   ________________________________  Artist Pais. Kathrynn Running, M.D.

## 2023-03-07 ENCOUNTER — Ambulatory Visit
Admission: RE | Admit: 2023-03-07 | Discharge: 2023-03-07 | Disposition: A | Discharge status: Home / Self Care | Payer: Medicare Other | Source: Ambulatory Visit | Attending: Urology | Admitting: Urology

## 2023-03-07 ENCOUNTER — Encounter: Payer: Self-pay | Admitting: Urology

## 2023-03-07 ENCOUNTER — Ambulatory Visit
Admission: RE | Admit: 2023-03-07 | Discharge: 2023-03-07 | Disposition: A | Discharge status: Home / Self Care | Payer: Medicare Other | Source: Ambulatory Visit | Attending: Radiation Oncology | Admitting: Radiation Oncology

## 2023-03-07 VITALS — Resp 19 | Ht 71.0 in | Wt 210.0 lb

## 2023-03-07 DIAGNOSIS — C61 Malignant neoplasm of prostate: Secondary | ICD-10-CM | POA: Diagnosis present

## 2023-03-07 DIAGNOSIS — Z510 Encounter for antineoplastic radiation therapy: Secondary | ICD-10-CM | POA: Diagnosis not present

## 2023-03-07 NOTE — Progress Notes (Addendum)
Pre-seed nursing interview for a diagnosis of Stage T1c adenocarcinoma of the prostate with Gleason score of 3+4, and PSA of 6.4. .  Patient identity verified x2.   Patient reports RT hip pain 9/10, and occasional chest pain due to multiple stents in place.   No prostate RELATED issues conveyed at this time.  Meaningful use complete.  Urinary Management medication(s)- None Urology appointment date- Pending, with Dr. Betsy Coder at [facility]  Resp 19   Ht 5\' 11"  (1.803 m)   Wt 210 lb (95.3 kg)   BMI 29.29 kg/m   This concludes the interaction.  Ruel Favors, LPN

## 2023-03-08 LAB — PROSTATE-SPECIFIC AG, SERUM (LABCORP): Prostate Specific Ag, Serum: 2.2 ng/mL (ref 0.0–4.0)

## 2023-03-12 NOTE — Progress Notes (Signed)
RN left voicemail for call back to review recent PSA results.

## 2023-03-12 NOTE — Progress Notes (Signed)
Patient's wife notified of recent PSA results.  RN provided education on post treatment PSA monitoring.  All questions answered, no additional needs at this time.

## 2023-03-20 ENCOUNTER — Other Ambulatory Visit: Payer: Self-pay | Admitting: Cardiology

## 2023-03-25 ENCOUNTER — Telehealth: Payer: Self-pay | Admitting: *Deleted

## 2023-03-25 NOTE — Telephone Encounter (Signed)
RETURNED PATIENT'S PHONE CALL, SPOKE WITH PATIENT. ?

## 2023-04-01 ENCOUNTER — Encounter (HOSPITAL_BASED_OUTPATIENT_CLINIC_OR_DEPARTMENT_OTHER): Payer: Self-pay | Admitting: Urology

## 2023-04-01 NOTE — Progress Notes (Signed)
Called Dr Saddie Benders office and spoke w/ office staff inquired about if patient had received or have request for clearance from his cardiologist for asa/ plavix and need pre-op orders for patient scheduled surgery @ [facility] by Dr Saddie Benders this Thursday 04-04-2023.  Was told will send urgent message to Dr Saddie Benders and his nurse.

## 2023-04-02 ENCOUNTER — Telehealth: Payer: Self-pay | Admitting: Cardiology

## 2023-04-02 ENCOUNTER — Encounter (HOSPITAL_BASED_OUTPATIENT_CLINIC_OR_DEPARTMENT_OTHER): Payer: Self-pay | Admitting: Urology

## 2023-04-02 NOTE — Telephone Encounter (Signed)
I have left a message for the requesting office that I will fax notes from [name], NP to review. Procedure may need to be post poned as pt has not held ASA or Plavix.

## 2023-04-02 NOTE — Telephone Encounter (Signed)
   Pre-operative Risk Assessment  Last visit: 10/25/2022 Next visit: none   Patient Name: Clarence Schneider  DOB: 1950-04-24 MRN: [id number]      Request for Surgical Clearance    Procedure:   Brachytherapy-Seeds placement { 2. When is this surgery scheduled? Press F2  04/04/2023                                Surgeon:  Drs. Saddie Benders and [employer]  Surgeon's Group or Practice Name:  [facility] Phone number:  [phone] Fax number:  [phone]   Type of Clearance Requested:   - Pharmacy:  Hold Aspirin and Clopidogrel (Plavix) , Aspirin     Type of Anesthesia:  General    Additional requests/questions:   Request is to hold Aspirin 3 days prior and hold Plavix 5 days prior  Signed, Royann Shivers   04/02/2023, 3:38 PM

## 2023-04-02 NOTE — Telephone Encounter (Signed)
Pre op APP states pt called back and stated that he has been holding ASA and Plavix. I explained that I had called the surgeon's office and with the information given the first time that he had not held meds.   I called the surgeon office and s/w Morrie Sheldon and stated to just disregard the message I left a few minutes ago. Clarence Schneider will fax over her notes once completed.

## 2023-04-02 NOTE — Progress Notes (Signed)
Spoke w/ via phone for pre-op interview--- pt Lab needs dos----  [employer] results------ current EKG in epic/ chart COVID test -----patient states asymptomatic no test needed Arrive at -------  1200 on 04-04-2023 NPO after MN NO Solid Food.  Clear liquids from MN until--- 1100 Med rec completed Medications to take morning of surgery ----- coreg Diabetic medication ----- do not take farxiga morning of surgery Patient instructed no nail polish to be worn day of surgery Patient instructed to bring photo id and insurance card day of surgery Patient aware to have Driver (ride ) / caregiver    for 24 hours after surgery - wife, judy Patient Special Instructions ----- will do one fleet enema morning surgery. Pt asked to bring his INSPIRE device control with him dos.  Pre-Op special Instructions ----- on 04-01-2023  called and left messge for Tresa Endo , office staff for Dr Saddie Benders requested cardiac clearance for pt's asa/ plavix and for Dr Saddie Benders to place pre-op orders. Spoke w Tresa Endo today 04-02-2023 , she stated she has put in a urgent request for pt's cardiology for clearance and has let Dr Saddie Benders aware to put in orders. Telephone cardiac clearance in epic by [name] NP on 04-02-2023 for ASA / Plavix, printed copy and placed in chart.  Patient verbalized understanding of instructions that were given at this phone interview. Patient denies chest pain, sob, fever, cough at the interview.    Anesthesia Review:  HTN;  hx NSVtach;  CAD s/p  BMS 1995 x2 RCA dissection/ occlusion and x1 BMS 1996 then 06-21-2017  Balloon angioplasty restenosis distal RCA;  severe OSA intolerant cpap/ bipap  s/p INSPIRE placement 05/ 2023;  DM2;  CKD 3;   Pt denies cardiac s&s, sob, no peripheral swelling.   Pt also stated has not taken a nitroglycerin since having prescription.   PCP: Dr Lorenso Courier Cardiologist :  Dr Consuello Bossier (lov 10-25-2022) Chest x-ray : 06-14-2017 EKG :  10-25-2022 Echo : 10-30-2022 Stress test:    stress echo 06-27-2018/  NUC 06-03-2017 Cardiac Cath : 06-21-2017 Activity level:  denies sob w/ any activity  Sleep Study/ CPAP :  yes/  PT HAS INSPIRE DEVICE /  BRING CONTROL DOS Fasting Blood Sugar : / Checks Blood Sugar -- times a day:  does not check Blood Thinner/ Instructions /Last Dose:  Plavix ASA / Instructions/ Last Dose : ASA 81mg  Pt stated was given instructions by Dr Saddie Benders office to stop ASA/ Plavix prior to surgery, stated last dose for both 03-29-2023.

## 2023-04-02 NOTE — Telephone Encounter (Addendum)
   Name: Clarence Schneider  DOB: May 27, 1950  MRN: [id number]   Primary Cardiologist: Garwin Brothers, MD  Chart reviewed as part of pre-operative protocol coverage. Patient was contacted 04/02/2023 in reference to pre-operative risk assessment for pending surgery as outlined below.  Clarence Schneider was last seen on 10/25/2022 by Dr. Tomie China.  Since that day, Clarence Schneider has done well.  He denies any new symptoms or concerns.  He is able to complete greater than 4 METS without difficulty.  Therefore, based on ACC/AHA guidelines, the patient would be at acceptable risk for the planned procedure without further cardiovascular testing.   Per office protocol, he may hold Plavix for 5 days and Aspirin for 5-7 days prior to procedure. Please resume Plavix and Aspirin as soon as possible postprocedure, at the discretion of the surgeon.   The patient was advised that if he develops new symptoms prior to surgery to contact our office to arrange for a follow-up visit, and he verbalized understanding.  I will route this recommendation to the requesting party via Epic fax function and remove from pre-op pool. Please call with questions.  Joylene Grapes, NP 04/02/2023, 4:16 PM

## 2023-04-03 ENCOUNTER — Telehealth: Payer: Self-pay | Admitting: *Deleted

## 2023-04-03 NOTE — Telephone Encounter (Signed)
CALLED PATIENT TO REMIND OF PROCEDURE FOR 04-04-23, SPOKE WITH PATIENT'S WIFE- JUDY AND SHE IS AWARE OF THIS PROCEDURE

## 2023-04-04 ENCOUNTER — Ambulatory Visit (HOSPITAL_BASED_OUTPATIENT_CLINIC_OR_DEPARTMENT_OTHER): Payer: Medicare Other | Admitting: Anesthesiology

## 2023-04-04 ENCOUNTER — Encounter (HOSPITAL_BASED_OUTPATIENT_CLINIC_OR_DEPARTMENT_OTHER)
Admission: RE | Disposition: A | Discharge status: Home / Self Care | Payer: Self-pay | Source: Home / Self Care | Attending: Urology

## 2023-04-04 ENCOUNTER — Encounter (HOSPITAL_BASED_OUTPATIENT_CLINIC_OR_DEPARTMENT_OTHER): Payer: Self-pay | Admitting: Urology

## 2023-04-04 ENCOUNTER — Ambulatory Visit (HOSPITAL_BASED_OUTPATIENT_CLINIC_OR_DEPARTMENT_OTHER)
Admission: RE | Admit: 2023-04-04 | Discharge: 2023-04-04 | Disposition: A | Discharge status: Home / Self Care | Payer: Medicare Other | Attending: Urology | Admitting: Urology

## 2023-04-04 ENCOUNTER — Ambulatory Visit (HOSPITAL_COMMUNITY): Payer: Medicare Other

## 2023-04-04 ENCOUNTER — Other Ambulatory Visit: Payer: Self-pay

## 2023-04-04 DIAGNOSIS — Z01818 Encounter for other preprocedural examination: Secondary | ICD-10-CM

## 2023-04-04 DIAGNOSIS — I10 Essential (primary) hypertension: Secondary | ICD-10-CM | POA: Diagnosis not present

## 2023-04-04 DIAGNOSIS — J449 Chronic obstructive pulmonary disease, unspecified: Secondary | ICD-10-CM | POA: Diagnosis not present

## 2023-04-04 DIAGNOSIS — Z955 Presence of coronary angioplasty implant and graft: Secondary | ICD-10-CM | POA: Diagnosis not present

## 2023-04-04 DIAGNOSIS — I2510 Atherosclerotic heart disease of native coronary artery without angina pectoris: Secondary | ICD-10-CM | POA: Diagnosis not present

## 2023-04-04 DIAGNOSIS — G4730 Sleep apnea, unspecified: Secondary | ICD-10-CM | POA: Insufficient documentation

## 2023-04-04 DIAGNOSIS — C61 Malignant neoplasm of prostate: Secondary | ICD-10-CM | POA: Insufficient documentation

## 2023-04-04 DIAGNOSIS — E119 Type 2 diabetes mellitus without complications: Secondary | ICD-10-CM | POA: Diagnosis not present

## 2023-04-04 HISTORY — DX: Personal history of adenomatous and serrated colon polyps: Z86.0101

## 2023-04-04 HISTORY — PX: CYSTOSCOPY: SHX5120

## 2023-04-04 HISTORY — DX: Presence of spectacles and contact lenses: Z97.3

## 2023-04-04 HISTORY — DX: Dizziness and giddiness: R42

## 2023-04-04 HISTORY — DX: Gastro-esophageal reflux disease without esophagitis: K21.9

## 2023-04-04 HISTORY — DX: Benign prostatic hyperplasia with lower urinary tract symptoms: N40.1

## 2023-04-04 HISTORY — DX: Long term (current) use of anticoagulants: Z79.01

## 2023-04-04 HISTORY — PX: RADIOACTIVE SEED IMPLANT: SHX5150

## 2023-04-04 HISTORY — DX: Obstructive sleep apnea (adult) (pediatric): G47.33

## 2023-04-04 HISTORY — DX: Other allergy status, other than to drugs and biological substances: Z91.09

## 2023-04-04 HISTORY — DX: Type 2 diabetes mellitus without complications: E11.9

## 2023-04-04 HISTORY — DX: Unspecified osteoarthritis, unspecified site: M19.90

## 2023-04-04 HISTORY — DX: Personal history of other diseases of the nervous system and sense organs: Z86.69

## 2023-04-04 HISTORY — DX: Male erectile dysfunction, unspecified: N52.9

## 2023-04-04 HISTORY — DX: Glycosuria: R81

## 2023-04-04 HISTORY — DX: Other forms of dyspnea: R06.09

## 2023-04-04 LAB — POCT I-STAT, CHEM 8
BUN: 19 mg/dL (ref 8–23)
Calcium, Ion: 1.17 mmol/L (ref 1.15–1.40)
Chloride: 103 mmol/L (ref 98–111)
Creatinine, Ser: 1.3 mg/dL — ABNORMAL HIGH (ref 0.61–1.24)
Glucose, Bld: 152 mg/dL — ABNORMAL HIGH (ref 70–99)
HCT: 40 % (ref 39.0–52.0)
Hemoglobin: 13.6 g/dL (ref 13.0–17.0)
Potassium: 4.1 mmol/L (ref 3.5–5.1)
Sodium: 140 mmol/L (ref 135–145)
TCO2: 28 mmol/L (ref 22–32)

## 2023-04-04 LAB — GLUCOSE, CAPILLARY: Glucose-Capillary: 122 mg/dL — ABNORMAL HIGH (ref 70–99)

## 2023-04-04 SURGERY — INSERTION, RADIATION SOURCE, PROSTATE
Anesthesia: General | Site: Prostate

## 2023-04-04 MED ORDER — EPHEDRINE SULFATE (PRESSORS) 50 MG/ML IJ SOLN
INTRAMUSCULAR | Status: DC | PRN
  Administered 2023-04-04 (×2): 5 mg via INTRAVENOUS

## 2023-04-04 MED ORDER — FENTANYL CITRATE (PF) 100 MCG/2ML IJ SOLN
INTRAMUSCULAR | Status: DC | PRN
  Administered 2023-04-04 (×2): 25 ug via INTRAVENOUS
  Administered 2023-04-04: 50 ug via INTRAVENOUS

## 2023-04-04 MED ORDER — GLYCOPYRROLATE 0.2 MG/ML IJ SOLN
INTRAMUSCULAR | Status: DC | PRN
  Administered 2023-04-04: .1 mg via INTRAVENOUS

## 2023-04-04 MED ORDER — HYDROMORPHONE HCL 1 MG/ML IJ SOLN: 0.2500 mg | INTRAMUSCULAR | Status: DC | PRN

## 2023-04-04 MED ORDER — ONDANSETRON HCL 4 MG/2ML IJ SOLN
INTRAMUSCULAR | Status: DC | PRN
  Administered 2023-04-04: 4 mg via INTRAVENOUS

## 2023-04-04 MED ORDER — PROPOFOL 10 MG/ML IV BOLUS
INTRAVENOUS | Status: AC
  Filled 2023-04-04: qty 20

## 2023-04-04 MED ORDER — LIDOCAINE HCL (CARDIAC) PF 100 MG/5ML IV SOSY
PREFILLED_SYRINGE | INTRAVENOUS | Status: DC | PRN
  Administered 2023-04-04: 50 mg via INTRAVENOUS

## 2023-04-04 MED ORDER — ONDANSETRON HCL 4 MG/2ML IJ SOLN
INTRAMUSCULAR | Status: AC
  Filled 2023-04-04: qty 8

## 2023-04-04 MED ORDER — LIDOCAINE HCL (PF) 2 % IJ SOLN
INTRAMUSCULAR | Status: AC
  Filled 2023-04-04: qty 10

## 2023-04-04 MED ORDER — ROCURONIUM BROMIDE 10 MG/ML (PF) SYRINGE
PREFILLED_SYRINGE | INTRAVENOUS | Status: AC
  Filled 2023-04-04: qty 30

## 2023-04-04 MED ORDER — FENTANYL CITRATE (PF) 100 MCG/2ML IJ SOLN
INTRAMUSCULAR | Status: AC
  Filled 2023-04-04: qty 2

## 2023-04-04 MED ORDER — MIDAZOLAM HCL 2 MG/2ML IJ SOLN
0.5000 mg | Freq: Once | INTRAMUSCULAR | Status: DC | PRN
Start: 2023-04-04 — End: 2023-04-04

## 2023-04-04 MED ORDER — ACETAMINOPHEN 500 MG PO TABS
ORAL_TABLET | ORAL | Status: AC
  Filled 2023-04-04: qty 2

## 2023-04-04 MED ORDER — SODIUM CHLORIDE 0.9 % IV SOLN
INTRAVENOUS | Status: DC
  Administered 2023-04-04: 1000 mL via INTRAVENOUS

## 2023-04-04 MED ORDER — MEPERIDINE HCL 25 MG/ML IJ SOLN: 6.2500 mg | INTRAMUSCULAR | Status: DC | PRN

## 2023-04-04 MED ORDER — CIPROFLOXACIN IN D5W 400 MG/200ML IV SOLN
400.0000 mg | INTRAVENOUS | Status: AC
  Administered 2023-04-04: 400 mg via INTRAVENOUS

## 2023-04-04 MED ORDER — STERILE WATER FOR IRRIGATION IR SOLN
Status: DC | PRN
  Administered 2023-04-04: 500 mL

## 2023-04-04 MED ORDER — ACETAMINOPHEN 500 MG PO TABS
1000.0000 mg | ORAL_TABLET | Freq: Once | ORAL | Status: AC
Start: 2023-04-04 — End: 2023-04-04
  Administered 2023-04-04: 1000 mg via ORAL

## 2023-04-04 MED ORDER — ROCURONIUM BROMIDE 100 MG/10ML IV SOLN
INTRAVENOUS | Status: DC | PRN
  Administered 2023-04-04: 70 mg via INTRAVENOUS

## 2023-04-04 MED ORDER — SODIUM CHLORIDE 0.9 % IR SOLN
Status: DC | PRN
  Administered 2023-04-04: 1000 mL via INTRAVESICAL

## 2023-04-04 MED ORDER — IOHEXOL 300 MG/ML  SOLN
INTRAMUSCULAR | Status: DC | PRN
  Administered 2023-04-04: 7 mL

## 2023-04-04 MED ORDER — EPHEDRINE 5 MG/ML INJ
INTRAVENOUS | Status: AC
  Filled 2023-04-04: qty 5

## 2023-04-04 MED ORDER — FLUORESCEIN SODIUM 10 % IV SOLN
INTRAVENOUS | Status: AC
  Filled 2023-04-04: qty 10

## 2023-04-04 MED ORDER — PROPOFOL 10 MG/ML IV BOLUS
INTRAVENOUS | Status: DC | PRN
  Administered 2023-04-04: 200 mg via INTRAVENOUS
  Administered 2023-04-04: 50 mg via INTRAVENOUS

## 2023-04-04 MED ORDER — KETOROLAC TROMETHAMINE 30 MG/ML IJ SOLN
INTRAMUSCULAR | Status: AC
  Filled 2023-04-04: qty 3

## 2023-04-04 MED ORDER — DEXMEDETOMIDINE HCL IN NACL 80 MCG/20ML IV SOLN
INTRAVENOUS | Status: AC
  Filled 2023-04-04: qty 20

## 2023-04-04 MED ORDER — SUGAMMADEX SODIUM 200 MG/2ML IV SOLN
INTRAVENOUS | Status: DC | PRN
  Administered 2023-04-04: 200 mg via INTRAVENOUS

## 2023-04-04 MED ORDER — PHENYLEPHRINE 80 MCG/ML (10ML) SYRINGE FOR IV PUSH (FOR BLOOD PRESSURE SUPPORT)
PREFILLED_SYRINGE | INTRAVENOUS | Status: AC
  Filled 2023-04-04: qty 10

## 2023-04-04 MED ORDER — CIPROFLOXACIN IN D5W 400 MG/200ML IV SOLN
INTRAVENOUS | Status: AC
  Filled 2023-04-04: qty 200

## 2023-04-04 SURGICAL SUPPLY — 35 items
BAG URINE DRAIN 2000ML AR STRL (UROLOGICAL SUPPLIES) IMPLANT
BLADE CLIPPER SENSICLIP SURGIC (BLADE) ×2 IMPLANT
CATH FOLEY 2WAY SLVR 5CC 16FR (CATHETERS) ×4 IMPLANT
CATH ROBINSON RED A/P 20FR (CATHETERS) ×2 IMPLANT
COVER BACK TABLE 60X90IN (DRAPES) ×2 IMPLANT
COVER MAYO STAND STRL (DRAPES) ×2 IMPLANT
DRSG TEGADERM 4X4.75 (GAUZE/BANDAGES/DRESSINGS) ×2 IMPLANT
DRSG TEGADERM 8X12 (GAUZE/BANDAGES/DRESSINGS) ×2 IMPLANT
GAUZE SPONGE 4X4 3PLY NS LF (GAUZE/BANDAGES/DRESSINGS) IMPLANT
GLOVE BIO SURGEON STRL SZ 6.5 (GLOVE) ×2 IMPLANT
GLOVE BIO SURGEON STRL SZ7.5 (GLOVE) ×4 IMPLANT
GLOVE BIO SURGEON STRL SZ8 (GLOVE) IMPLANT
GLOVE BIOGEL PI IND STRL 6.5 (GLOVE) IMPLANT
GLOVE SURG ORTHO 8.5 STRL (GLOVE) IMPLANT
GOWN STRL REUS W/ TWL LRG LVL3 (GOWN DISPOSABLE) IMPLANT
GOWN STRL REUS W/TWL LRG LVL3 (GOWN DISPOSABLE) ×2 IMPLANT
GRID BRACH TEMP 18GA 2.8X3X.75 (MISCELLANEOUS) ×2 IMPLANT
HOLDER FOLEY CATH W/STRAP (MISCELLANEOUS) ×2 IMPLANT
I-125 seed IMPLANT
IV NS 1000ML BAXH (IV SOLUTION) ×2 IMPLANT
KIT TURNOVER CYSTO (KITS) ×2 IMPLANT
NDL BRACHY 18G 5PK (NEEDLE) ×8 IMPLANT
NDL BRACHY 18G SINGLE (NEEDLE) IMPLANT
NDL PK MORGANSTERN STABILIZ (NEEDLE) ×2 IMPLANT
NEEDLE BRACHY 18G 5PK (NEEDLE) ×8
NEEDLE BRACHY 18G SINGLE (NEEDLE)
NEEDLE PK MORGANSTERN STABILIZ (NEEDLE) ×2
PACK CYSTO (CUSTOM PROCEDURE TRAY) ×2 IMPLANT
SHEATH ULTRASOUND LF (SHEATH) IMPLANT
SHEATH ULTRASOUND LTX NONSTRL (SHEATH) IMPLANT
SLEEVE SCD COMPRESS KNEE MED (STOCKING) ×2 IMPLANT
SYR 10ML LL (SYRINGE) ×2 IMPLANT
TOWEL OR 17X24 6PK STRL BLUE (TOWEL DISPOSABLE) ×2 IMPLANT
UNDERPAD 30X36 HEAVY ABSORB (UNDERPADS AND DIAPERS) ×4 IMPLANT
WATER STERILE IRR 500ML POUR (IV SOLUTION) ×2 IMPLANT

## 2023-04-04 NOTE — Anesthesia Preprocedure Evaluation (Addendum)
Anesthesia Evaluation  Patient identified by MRN, date of birth, ID band Patient awake    Reviewed: Allergy & Precautions, NPO status , Patient's Chart, lab work & pertinent test results, reviewed documented beta blocker date and time   History of Anesthesia Complications Negative for: history of anesthetic complications  Airway Mallampati: II  TM Distance: >3 FB Neck ROM: Full    Dental  (+) Dental Advisory Given   Pulmonary sleep apnea (Inspire implant) , COPD   breath sounds clear to auscultation       Cardiovascular hypertension, Pt. on medications and Pt. on home beta blockers (-) angina + CAD, + Cardiac Stents and + Peripheral Vascular Disease   Rhythm:Regular Rate:Normal  10/2022 ECHO: EF 60 to 65%.  1. The LV has normal function. The left ventricle has no regional wall motion abnormalities. There is moderate LVH. Left ventricular diastolic parameters were normal. The average left ventricular global longitudinal  strain is -20.5 %. The global longitudinal strain is normal.   2. RVF is normal. The right ventricular size is normal. There is normal pulmonary artery systolic pressure.   3. The mitral valve is normal in structure. No evidence of mitral valve regurgitation. No evidence of mitral stenosis.   4. The aortic valve is calcified. Aortic valve regurgitation is not visualized. No aortic stenosis is present.     Neuro/Psych negative neurological ROS     GI/Hepatic negative GI ROS, Neg liver ROS,,,  Endo/Other  diabetes (glu 152)    Renal/GU Renal InsufficiencyRenal disease     Musculoskeletal   Abdominal   Peds  Hematology plavix   Anesthesia Other Findings   Reproductive/Obstetrics                             Anesthesia Physical Anesthesia Plan  ASA: 3  Anesthesia Plan: General   Post-op Pain Management: Tylenol PO (pre-op)*   Induction: Intravenous  PONV Risk Score and  Plan: 2 and Ondansetron and Dexamethasone  Airway Management Planned: Oral ETT  Additional Equipment: None  Intra-op Plan:   Post-operative Plan: Extubation in OR  Informed Consent: I have reviewed the patients History and Physical, chart, labs and discussed the procedure including the risks, benefits and alternatives for the proposed anesthesia with the patient or authorized representative who has indicated his/her understanding and acceptance.     Dental advisory given  Plan Discussed with: CRNA and Surgeon  Anesthesia Plan Comments:        Anesthesia Quick Evaluation

## 2023-04-04 NOTE — Transfer of Care (Signed)
Immediate Anesthesia Transfer of Care Note  Patient: Clarence Schneider  Procedure(s) Performed: RADIOACTIVE SEED IMPLANT/BRACHYTHERAPY IMPLANT (Prostate) CYSTOSCOPY FLEXIBLE (Bladder)  Patient Location: PACU  Anesthesia Type:General  Level of Consciousness: awake, alert , oriented, and patient cooperative  Airway & Oxygen Therapy: Patient Spontanous Breathing and Patient connected to nasal cannula oxygen  Post-op Assessment: Report given to RN and Post -op Vital signs reviewed and stable  Post vital signs: Reviewed and stable  Last Vitals:  Vitals Value Taken Time  BP    Temp    Pulse 57 04/04/23 1618  Resp    SpO2 100 % 04/04/23 1618  Vitals shown include unfiled device data.  Last Pain:  Vitals:   04/04/23 1241  PainSc: 0-No pain      Patients Stated Pain Goal: 8 (04/04/23 1241)  Complications: No notable events documented.

## 2023-04-04 NOTE — Anesthesia Procedure Notes (Signed)
Procedure Name: Intubation Date/Time: 04/04/2023 2:56 PM  Performed by: Earmon Phoenix, CRNAPre-anesthesia Checklist: Patient identified, Emergency Drugs available, Suction available, Patient being monitored and Timeout performed Patient Re-evaluated:Patient Re-evaluated prior to induction Preoxygenation: Pre-oxygenation with 100% oxygen Induction Type: IV induction Ventilation: Mask ventilation without difficulty Laryngoscope Size: Mac, 4 and Glidescope Grade View: Grade I Tube type: Oral Number of attempts: 2 Airway Equipment and Method: Stylet Placement Confirmation: ETT inserted through vocal cords under direct vision, positive ETCO2 and breath sounds checked- equal and bilateral Secured at: 23 cm Tube secured with: Tape Dental Injury: Teeth and Oropharynx as per pre-operative assessment  Comments: Large tongue, small mouth, short neck difficult to get larynoscope blade in mouth called for glide immediately with easy mask ventilation with airway;after tube placed recognized immediately cuff had a tear with small leak;exchanged over bougoe without problem

## 2023-04-04 NOTE — Op Note (Unsigned)
NAME: PANAGIOTIS, MACINNES MEDICAL RECORD NO: [id number] ACCOUNT NO: [id number] DATE OF BIRTH: Jan 05, 1951 FACILITY: [facility] LOCATION: [facility] PHYSICIAN: [name], MD  Operative Report   DATE OF PROCEDURE: 04/04/2023  PREOPERATIVE DIAGNOSIS:  Stage T1c adenocarcinoma of the prostate.  POSTOPERATIVE DIAGNOSIS:  Stage T1c adenocarcinoma of the prostate.  PROCEDURE PERFORMED:  Transrectal ultrasound of the prostate, radioactive intraprostatic seed implantation, and cystoscopy.  SURGEONS OF RECORD:  [name], MD and Dr. Margaretmary Dys.  INDICATIONS:  The patient is a 72 year old black male with diagnosis of prostate cancer.  He has been presented his options and he has opted for definitive therapy with radioactive seed implantation.  DESCRIPTION OF PROCEDURE:  The patient was brought into the operating room and placed on the table in the supine position.  After adequate general anesthesia was achieved, a Foley catheter was placed to drain the bladder.  The patient was then carefully  positioned in exaggerated lithotomy with the Yellofin stirrups.  The scrotum was then shaved, elevated, and taped in place onto the lower abdomen.  The perineum was shaved, prepped, and draped for the performance of the procedure.  A red rubber catheter  was then placed into the rectum to relieve excess air.  Transrectal ultrasound probe was placed and prostatic anchors placed.  The prostate was then scanned from apex to seminal vesicles.  The scanned images went into the computer program [employer].  The  individual images were then reviewed by myself and Dr. Kathrynn Running and prostatic borders, urethra, and rectum were appropriately marked.  Computer program calculated seed placement.  All of the individual images were then reviewed by myself and Dr. Kathrynn Running  to ascertain seed placement.  Once we had good coverage of the prostate with sparing of the urethra and rectum from the radiation effect, seed implantation was  begun.  The bladder neck was identified with contrast in the Foley balloon and noted to be in  place.  Needles were then placed from anterior to posterior on the prostate to encompass the prostate in its entirety.  No portion of the prostate was left untreated.  Once the final seeds were placed, the anchor needles were removed and pelvic x-ray was  obtained, showing good distribution of the seeds.  Foley catheter was then removed.  The patient was then re-prepped and draped and flexible cystoscopy performed, which showed no seeds within the bladder.  Under sterile conditions, the Foley was  replaced.  Rectal exam was then performed and this did not reveal any foreign bodies in the rectum.  A total of 59 seeds were implanted into the prostate.  The patient tolerated all this well.  He was then awakened and taken to the recovery room in good  condition.   PUS D: 04/04/2023 4:28:09 pm T: 04/04/2023 7:03:00 pm  JOB: [id number]/ [id number]

## 2023-04-04 NOTE — Interval H&P Note (Signed)
History and Physical Interval Note:  04/04/2023 2:33 PM  Clarence Schneider  has presented today for surgery, with the diagnosis of PROSTATE CANCER.  The various methods of treatment have been discussed with the patient and family. After consideration of risks, benefits and other options for treatment, the patient has consented to  Procedure(s): RADIOACTIVE SEED IMPLANT/BRACHYTHERAPY IMPLANT (N/A) as a surgical intervention.  The patient's history has been reviewed, patient examined, no change in status, stable for surgery.  I have reviewed the patient's chart and labs.  Questions were answered to the patient's satisfaction.     Clarence Schneider

## 2023-04-04 NOTE — Brief Op Note (Signed)
04/04/2023  4:20 PM  PATIENT:  Clarence Schneider  72 y.o. male  PRE-OPERATIVE DIAGNOSIS:  PROSTATE CANCER  POST-OPERATIVE DIAGNOSIS:  PROSTATE CANCER  PROCEDURE:  Procedure(s): RADIOACTIVE SEED IMPLANT/BRACHYTHERAPY IMPLANT (N/A) CYSTOSCOPY FLEXIBLE  SURGEON:  Surgeons and Role:    [name], MD - Primary    * Margaretmary Dys, MD - Assisting  PHYSICIAN ASSISTANT:   ASSISTANTS: none   ANESTHESIA:   general  EBL:  2 cc   BLOOD ADMINISTERED:none  DRAINS: Urinary Catheter (Foley)   LOCAL MEDICATIONS USED:  NONE  SPECIMEN:  No Specimen  DISPOSITION OF SPECIMEN:  N/A  COUNTS:  YES  TOURNIQUET:  * No tourniquets in log *  DICTATION: .Other Dictation: Dictation Number [id number]  PLAN OF CARE: Discharge to home after PACU  PATIENT DISPOSITION:  PACU - hemodynamically stable.   Delay start of Pharmacological VTE agent (>24hrs) due to surgical blood loss or risk of bleeding: not applicable

## 2023-04-04 NOTE — Discharge Instructions (Addendum)
  Post Anesthesia Home Care Instructions  Activity: Get plenty of rest for the remainder of the day. A responsible individual must stay with you for 24 hours following the procedure.  For the next 24 hours, DO NOT: -Drive a car -Advertising copywriter -Drink alcoholic beverages -Take any medication unless instructed by your physician -Make any legal decisions or sign important papers.  Meals: Start with liquid foods such as gelatin or soup. Progress to regular foods as tolerated. Avoid greasy, spicy, heavy foods. If nausea and/or vomiting occur, drink only clear liquids until the nausea and/or vomiting subsides. Call your physician if vomiting continues.  Special Instructions/Symptoms: Your throat may feel dry or sore from the anesthesia or the breathing tube placed in your throat during surgery. If this causes discomfort, gargle with warm salt water. The discomfort should disappear within 24 hours.      No acetaminophen/Tylenol until after 7:10 pm today if needed.

## 2023-04-04 NOTE — Progress Notes (Signed)
Radiation Oncology         [phone] ________________________________  Name: Clarence Schneider MRN: [id number]  Date: 04/04/2023  DOB: 13-Mar-1951       Prostate Seed Implant  [id number], [name], MD  No ref. provider found  DIAGNOSIS:  72 y.o. gentleman with Stage T1c adenocarcinoma of the prostate with Gleason score of 3+4, and PSA of 6.4.   Oncology History  Malignant neoplasm of prostate (HCC)  11/22/2022 Cancer Staging   Staging form: Prostate, AJCC 8th Edition - Clinical stage from 11/22/2022: Stage IIB (cT1c, cN0, cM0, PSA: 6.4, Grade Group: 2) - Signed by Marcello Fennel, PA-C on 01/28/2023 Histopathologic type: Adenocarcinoma, NOS Stage prefix: Initial diagnosis Prostate specific antigen (PSA) range: Less than 10 Gleason primary pattern: 3 Gleason secondary pattern: 4 Gleason score: 7 Histologic grading system: 5 grade system Number of biopsy cores examined: 12 Number of biopsy cores positive: 2 Location of positive needle core biopsies: One side   01/28/2023 Initial Diagnosis   Malignant neoplasm of prostate (HCC)       ICD-10-CM   1. Pre-op testing  Z01.818 CBG per Guidelines for Diabetes Management for Patients Undergoing Surgery ([facility], [facility], and [facility] only)    CBG per protocol    I-Stat, Chem 8 on day of surgery per protocol    CBG per Guidelines for Diabetes Management for Patients Undergoing Surgery ([facility], [facility], and [facility] only)    CBG per protocol    I-Stat, Chem 8 on day of surgery per protocol      PROCEDURE: Insertion of radioactive I-125 seeds into the prostate gland.  RADIATION DOSE: 145 Gy, definitive therapy.  TECHNIQUE: KHALIN MANTIONE was brought to the operating room with the urologist. He was placed in the dorsolithotomy position. He was catheterized and a rectal tube was inserted. The perineum was shaved, prepped and draped. The ultrasound probe was then introduced by me into the rectum to see the prostate gland.  TREATMENT DEVICE: I attached the needle  grid to the ultrasound probe stand and anchor needles were placed.  3D PLANNING: The prostate was imaged in 3D using a sagittal sweep of the prostate probe. These images were transferred to the planning computer. There, the prostate, urethra and rectum were defined on each axial reconstructed image. Then, the software created an optimized 3D plan and a few seed positions were adjusted. The quality of the plan was reviewed using [facility] information for the target and the following two organs at risk:  Urethra and Rectum.  Then the accepted plan was printed and handed off to the radiation therapist.  Under my supervision, the custom loading of the seeds and spacers was carried out using the quick loader.  These pre-loaded needles were then placed into the needle holder.Marland Kitchen  PROSTATE VOLUME STUDY:  Using transrectal ultrasound the volume of the prostate was verified to be 42.4 cc.  SPECIAL TREATMENT PROCEDURE/SUPERVISION AND HANDLING: The pre-loaded needles were then delivered by the urologist under sagittal guidance. A total of 18 needles were used to deposit 59 seeds in the prostate gland. The individual seed activity was 0.511 mCi.  SpaceOAR:  No  COMPLEX SIMULATION: At the end of the procedure, an anterior radiograph of the pelvis was obtained to document seed positioning and count. Cystoscopy was performed by the urologist to check the urethra and bladder.  MICRODOSIMETRY: At the end of the procedure, the patient was emitting 0.005 mR/hr at 1 meter. Accordingly, he was considered safe for hospital discharge.  PLAN:  The patient will return to the radiation oncology clinic for post implant CT dosimetry in three weeks.   ________________________________  Artist Pais Kathrynn Running, M.D.

## 2023-04-04 NOTE — H&P (Signed)
  72 yo Black gentleman with adenoca Gl 6 in LLM and Gl & in LLA; prostate volume 45 gm.  He has reviewed his options and is desirous of radioactive seed implantation for definitive therapy.    PM Hx:  CAD- s/p angioplasty and stents, CTR, Appy, Rt IH repair, elevated cholesterol and PVD  Meds:  listed in chart  Allergy:  codeine  RoS:  negative for CP, SOB, cough, constipation;  voiding adequately  PE:  VSS, afebrile  WD WN Male, NAD  Lungs- clear Heart- RRR Abdomen- obese, nontender Ext- without calf tenderness Neuro- non-focal  IMP:  Stage T1 C Prostate cancer  Plan:  Radioactive seed implantation for definitive therapy  Full H&P is dictation #: [id number]

## 2023-04-05 ENCOUNTER — Encounter (HOSPITAL_BASED_OUTPATIENT_CLINIC_OR_DEPARTMENT_OTHER): Payer: Self-pay | Admitting: Urology

## 2023-04-05 NOTE — H&P (Unsigned)
NAME: Clarence Schneider, Clarence Schneider MEDICAL RECORD NO: [id number] ACCOUNT NO: [id number] DATE OF BIRTH: Feb 14, 1951 FACILITY: [facility] LOCATION: [facility] PHYSICIAN: [name], MD  History and Physical   DATE OF ADMISSION: 04/04/2023  ADMISSION HISTORY AND PHYSICAL  For procedure on 04/04/2023.  HISTORY OF PRESENT ILLNESS:  The patient is a 72 year old gentleman who presented to the office with an elevated PSA of 6.4.  He underwent transrectal ultrasound and prostate biopsy revealing adenocarcinoma of the prostate, Gleason 6 (3+3) and 10% in the  left lateral mid lobe and Gleason 7 (3+4), 20% in the left lateral apex.  Prostate volume was 45 g.  He underwent staging CT scan, which did not show any evidence of metastatic disease.  The patient has reviewed his options for therapy and wishes to  receive radioactive seed implantation.  PAST MEDICAL HISTORY:  His past medical history is significant for coronary artery disease and hypertension.  He does have adult onset diabetes mellitus.  He has elevated cholesterol, history of degenerative joint disease.  He does have a history of  heart disease and is status post coronary artery stent placement.  PAST SURGICAL HISTORY:  His other past surgeries include appendectomy, cardiac catheterization with three stents, carpal tunnel release, previous colonoscopy, bilateral inguinal hernia repair, knee arthroscopies, total knee replacement on the left in  2015.  FAMILY HISTORY:  Significant for coronary artery disease, diabetes, and hypertension.  He has a brother that had prostate cancer.  SOCIAL HISTORY:  He is married and does not smoke.  He does not use alcohol.  MEDICATIONS:  His medications are multiple and listed in the chart.  ALLERGIES:  PATIENT IS ALLERGIC TO OXYCODONE.  REVIEW OF SYSTEMS:  Negative for any chest pain, shortness of breath, cough, and unintended weight changes.  He states he has regular bowel movements.  He has mild erectile  dysfunction.  PHYSICAL EXAMINATION: GENERAL:  A well-developed obese man, in no acute distress. The general appearance is alert and cooperative. HEENT:  Head normocephalic and atraumatic.  The eyes show that the pupils are round and reactive to light. NECK:  The neck is supple without masses.  Good range of motion. LUNGS:  Chest and lung exam reveal clear breath sounds to auscultation. BACK:  The back is without CVA tenderness. CARDIOVASCULAR:  The heart sounds regular rate and rhythm. ABDOMEN:  Abdomen is obese, nontender.  No midline incisions. GU:  The penis is without lesions.  Both testes descended.  Scrotum without masses.  Rectal shows normal sphincter tone.  The prostate is approximately 40 g in size. EXTREMITIES:  The extremities are without calf tenderness. NEUROLOGIC:  Neurologically, he is nonfocal.  DIAGNOSTIC DATA:  Pertinent laboratories are pending.  ASSESSMENT:  Impression is 72 year old gentleman with stage T1c adenocarcinoma of the prostate.  PLAN:  He has reviewed his options and has opted for radioactive seed implantation for definitive therapy.  The plan will be to proceed with transrectal ultrasound, radioactive seed implantation, and cystoscopy for definitive therapy of his prostate  cancer.  Questions have been answered, the patient verbalizes understanding and wishes to proceed.   MUK D: 04/03/2023 6:39:36 pm T: 04/03/2023 11:55:00 pm  JOB: [id number]/ [id number]

## 2023-04-05 NOTE — Anesthesia Postprocedure Evaluation (Signed)
Anesthesia Post Note  Patient: Clarence Schneider  Procedure(s) Performed: RADIOACTIVE SEED IMPLANT/BRACHYTHERAPY IMPLANT (Prostate) CYSTOSCOPY FLEXIBLE (Bladder)     Patient location during evaluation: PACU Anesthesia Type: General Level of consciousness: awake and alert Pain management: pain level controlled Vital Signs Assessment: post-procedure vital signs reviewed and stable Respiratory status: spontaneous breathing, nonlabored ventilation, respiratory function stable and patient connected to nasal cannula oxygen Cardiovascular status: blood pressure returned to baseline and stable Postop Assessment: no apparent nausea or vomiting Anesthetic complications: no   No notable events documented.  Last Vitals:  Vitals:   04/04/23 1700 04/04/23 1743  BP: (!) 169/85 (!) 152/87  Pulse: (!) 40 (!) 41  Resp: (!) 8 14  Temp:  36.6 C  SpO2: 100% 100%    Last Pain:  Vitals:   04/04/23 1743  TempSrc: Oral  PainSc:                  Clarence Schneider S

## 2023-04-24 ENCOUNTER — Telehealth: Payer: Self-pay | Admitting: *Deleted

## 2023-04-24 NOTE — Progress Notes (Signed)
  Radiation Oncology         [phone] ________________________________  Name: Clarence Schneider MRN: [id number]  Date: 04/25/2023  DOB: 1950/07/19  COMPLEX SIMULATION NOTE  NARRATIVE:  The patient was brought to the CT Simulation planning suite today following prostate seed implantation approximately one month ago.  Identity was confirmed.  All relevant records and images related to the planned course of therapy were reviewed.  Then, the patient was set-up supine.  CT images were obtained.  The CT images were loaded into the planning software.  Then the prostate and rectum were contoured.  Treatment planning then occurred.  The implanted iodine 125 seeds were identified by the physics staff for projection of radiation distribution  I have requested : 3D Simulation  I have requested a DVH of the following structures: Prostate and rectum.    ________________________________  Donnice FELIX Patrcia, M.D.

## 2023-04-24 NOTE — Telephone Encounter (Signed)
 CALLED PATIENT TO REMIND OF POST SEED APPTS. FOR 04-25-23, LVM FOR A RETURN CALL

## 2023-04-24 NOTE — Progress Notes (Signed)
 Radiation Oncology         [phone] ________________________________  Name: Clarence Schneider MRN: [id number]  Date: 04/25/2023  DOB: [birth year]  Post-Seed Follow-Up Visit Note  CC: Clarence Lamar CROME, MD  Clarence Lamar CROME, MD  Diagnosis:   73 y.o. gentleman with Stage T1c adenocarcinoma of the prostate with Gleason score of 3+4, and PSA of 6.4.     ICD-10-CM   1. Malignant neoplasm of prostate (HCC)  C61       Interval Since Last Radiation:  3 weeks 04/04/23:  Insertion of radioactive I-125 seeds into the prostate gland; 145 Gy, definitive therapy.  Narrative:  The patient returns today for routine follow-up.  He is complaining of increased urinary frequency and urinary hesitation symptoms. He filled out a questionnaire regarding urinary function today providing and overall IPSS score of 23 characterizing his symptoms as severe but gradually improving on Flomax daily as prescribed.  His pre-implant score was 5. He denies any abdominal pain or bowel symptoms although he does notice occasional dull, ache in the lower abd/pelvis after sitting an extended period of time and/or after a long walk. The discomfort subsides with rest and is manageable. He has noticed some increased fatigability but is remaining active and overall, is quite pleased with his progress to date.  ALLERGIES:  is allergic to oxycontin  [oxycodone  hcl], metoclopramide , oxycodone , and tape.  Meds: Current Outpatient Medications  Medication Sig Dispense Refill   ascorbic acid (VITAMIN C) 1000 MG tablet Take 1,000 mg by mouth daily.     aspirin  81 MG EC tablet Take 81 mg by mouth daily.      carvedilol  (COREG ) 12.5 MG tablet Take 1 tablet (12.5 mg total) by mouth 2 (two) times daily. 180 tablet 3   [Paused] clopidogrel  (PLAVIX ) 75 MG tablet Take 1 tablet (75 mg total) by mouth daily. 90 tablet 3   cycloSPORINE (RESTASIS) 0.05 % ophthalmic emulsion Place 1 drop into both eyes daily.     ezetimibe  (ZETIA ) 10 MG tablet  Take 1 tablet (10 mg total) by mouth daily. (Patient taking differently: Take 10 mg by mouth daily.) 90 tablet 3   FARXIGA 10 MG TABS tablet Take 10 mg by mouth daily.     fenofibrate  160 MG tablet Take 1 tablet (160 mg total) by mouth every evening. (Patient taking differently: Take 160 mg by mouth every evening.) 90 tablet 1   fexofenadine (ALLEGRA) 180 MG tablet Take 180 mg by mouth daily.     gabapentin  (NEURONTIN ) 300 MG capsule Take 300 mg by mouth 3 (three) times daily as needed for pain.     hydrochlorothiazide  (MICROZIDE ) 12.5 MG capsule Take 1 capsule (12.5 mg total) by mouth daily. (Patient taking differently: Take 12.5 mg by mouth daily.) 90 capsule 3   montelukast (SINGULAIR) 10 MG tablet Take 10 mg by mouth at bedtime.     [Paused] Multiple Vitamins-Minerals (MULTIVITAMIN WITH MINERALS) tablet Take 1 tablet by mouth daily.     nitroGLYCERIN  (NITROSTAT ) 0.4 MG SL tablet Place 1 tablet (0.4 mg total) under the tongue every 5 (five) minutes as needed for chest pain. 25 tablet 3   omega-3 acid ethyl esters (LOVAZA ) 1 g capsule Take 2 capsules (2 g total) by mouth 2 (two) times daily. 360 capsule 3   pyridoxine (B-6) 200 MG tablet Take 200 mg by mouth daily.     rosuvastatin  (CRESTOR ) 40 MG tablet Take 1 tablet (40 mg total) by mouth daily. (Patient taking differently: Take 40  mg by mouth at bedtime.) 90 tablet 3   [Paused] sildenafil (REVATIO) 20 MG tablet Take 20 mg by mouth as needed for erectile dysfunction.     tamsulosin (FLOMAX) 0.4 MG CAPS capsule Take 0.4 mg by mouth every evening. (Patient not taking: Reported on 04/02/2023)     telmisartan  (MICARDIS ) 80 MG tablet TAKE 1 TABLET BY MOUTH DAILY. (Patient taking differently: Take 80 mg by mouth every evening.) 90 tablet 1   [Paused] testosterone  cypionate (DEPOTESTOSTERONE CYPIONATE) 200 MG/ML injection Inject 1.25 mLs into the muscle every 14 (fourteen) days. (Patient not taking: Reported on 01/29/2023)     Tetrahydrozoline-Zn  Sulfate (EYE DROPS RELIEF OP) Place 1 drop into both eyes 2 (two) times daily.     triamcinolone (NASACORT) 55 MCG/ACT AERO nasal inhaler Place 2 sprays into the nose at bedtime.     vitamin B-12 (CYANOCOBALAMIN) 1000 MCG tablet Take 1,000 mcg by mouth daily.     No current facility-administered medications for this visit.    Physical Findings: In general this is a well appearing African American male in no acute distress. He's alert and oriented x4 and appropriate throughout the examination. Cardiopulmonary assessment is negative for acute distress and he exhibits normal effort.   Lab Findings: Lab Results  Component Value Date   WBC 3.5 10/25/2022   HGB 13.6 04/04/2023   HCT 40.0 04/04/2023   MCV 90 10/25/2022   PLT 179 10/25/2022    Radiographic Findings:  Patient underwent CT imaging in our clinic for post implant dosimetry. The CT will be reviewed by Dr. Patrcia to confirm there is an adequate distribution of radioactive seeds throughout the prostate gland and ensure that there are no seeds in or near the rectum.  We suspect the final radiation plan and dosimetry will show appropriate coverage of the prostate gland. He understands that we will call and inform him of any unexpected findings on further review of his imaging and dosimetry.  Impression/Plan: 73 y.o. gentleman with Stage T1c adenocarcinoma of the prostate with Gleason score of 3+4, and PSA of 6.4.  The patient is recovering from the effects of radiation. His urinary symptoms should gradually improve over the next 4-6 months. We talked about this today. He is encouraged by his improvement already and is otherwise pleased with his outcome. We also talked about long-term follow-up for prostate cancer following seed implant. He understands that ongoing PSA determinations and digital rectal exams will help perform surveillance to rule out disease recurrence. He had a follow up visit for catheter removal with Dr. Marda on 04/05/23  and advised to follow up in 3-4 weeks but this appointment has not yet been scheduled to his knowledge. He understands what to expect with his PSA measures. Patient was also educated today about some of the long-term effects from radiation including a small risk for rectal bleeding and possibly erectile dysfunction. We talked about some of the general management approaches to these potential complications. However, I did encourage the patient to contact our office or return at any point if he has questions or concerns related to his previous radiation and prostate cancer.    Sabra MICAEL Rusk, PA-C

## 2023-04-25 ENCOUNTER — Encounter: Payer: Self-pay | Admitting: *Deleted

## 2023-04-25 ENCOUNTER — Ambulatory Visit
Admission: RE | Admit: 2023-04-25 | Discharge: 2023-04-25 | Disposition: A | Discharge status: Home / Self Care | Payer: Medicare Other | Source: Ambulatory Visit | Attending: Urology | Admitting: Urology

## 2023-04-25 ENCOUNTER — Encounter: Payer: Self-pay | Admitting: Urology

## 2023-04-25 ENCOUNTER — Ambulatory Visit
Admission: RE | Admit: 2023-04-25 | Discharge: 2023-04-25 | Disposition: A | Discharge status: Home / Self Care | Payer: Medicare Other | Source: Ambulatory Visit | Attending: Radiation Oncology | Admitting: Radiation Oncology

## 2023-04-25 VITALS — BP 129/87 | HR 51 | Temp 98.4°F | Resp 18 | Ht 71.0 in | Wt 206.0 lb

## 2023-04-25 VITALS — BP 129/87 | HR 51 | Temp 98.4°F | Resp 16 | Ht 71.0 in | Wt 206.0 lb

## 2023-04-25 DIAGNOSIS — Z7902 Long term (current) use of antithrombotics/antiplatelets: Secondary | ICD-10-CM | POA: Insufficient documentation

## 2023-04-25 DIAGNOSIS — R350 Frequency of micturition: Secondary | ICD-10-CM | POA: Insufficient documentation

## 2023-04-25 DIAGNOSIS — Z79899 Other long term (current) drug therapy: Secondary | ICD-10-CM | POA: Insufficient documentation

## 2023-04-25 DIAGNOSIS — Z7984 Long term (current) use of oral hypoglycemic drugs: Secondary | ICD-10-CM | POA: Insufficient documentation

## 2023-04-25 DIAGNOSIS — Z7982 Long term (current) use of aspirin: Secondary | ICD-10-CM | POA: Insufficient documentation

## 2023-04-25 DIAGNOSIS — C61 Malignant neoplasm of prostate: Secondary | ICD-10-CM | POA: Insufficient documentation

## 2023-04-25 DIAGNOSIS — Z510 Encounter for antineoplastic radiation therapy: Secondary | ICD-10-CM | POA: Insufficient documentation

## 2023-04-25 DIAGNOSIS — Z923 Personal history of irradiation: Secondary | ICD-10-CM | POA: Insufficient documentation

## 2023-04-25 NOTE — Progress Notes (Signed)
 Post-seed nursing interview for a diagnosis of Stage T1c adenocarcinoma of the prostate with Gleason score of 3+4, and PSA of 6.4.   Patient identity verified x2.   Patient reports dysuria 8/10, nocturia x5+, fatigue, and urinary incontinence. Patient denies all other related issues at this time.  Meaningful use complete.  I-PSS score- 23 - Severe SHIM score- 15 Urinary Management medication(s) Tamsulosin Urology appointment date- Pending with Dr. Marda at [facility]  Vitals- BP 129/87 (BP Location: Left Arm, Patient Position: Sitting, Cuff Size: Normal)   Pulse (!) 51   Temp 98.4 F (36.9 C) (Temporal)   Resp 18   Ht 5' 11 (1.803 m)   Wt 206 lb (93.4 kg)   SpO2 99%   BMI 28.73 kg/m   This concludes the interaction.  Rosaline Minerva, LPN

## 2023-04-30 ENCOUNTER — Encounter: Payer: Self-pay | Admitting: Radiation Oncology

## 2023-04-30 DIAGNOSIS — Z510 Encounter for antineoplastic radiation therapy: Secondary | ICD-10-CM | POA: Diagnosis not present

## 2023-04-30 NOTE — Progress Notes (Signed)
  Radiation Oncology         [phone] ________________________________  Name: Clarence Schneider MRN: [id number]  Date: 04/30/2023  DOB: 06-01-1950  3D Planning Note   Prostate Brachytherapy Post-Implant Dosimetry  Diagnosis:  73 y.o. gentleman with Stage T1c adenocarcinoma of the prostate with Gleason score of 3+4, and PSA of 6.4.   Narrative: On a previous date, Clarence Schneider returned following prostate seed implantation for post implant planning. He underwent CT scan complex simulation to delineate the three-dimensional structures of the pelvis and demonstrate the radiation distribution.  Since that time, the seed localization, and complex isodose planning with dose volume histograms have now been completed.  Results:   Prostate Coverage - The dose of radiation delivered to the 90% or more of the prostate gland (D90) was 129.46% of the prescription dose. This exceeds our goal of greater than 90%. Rectal Sparing - The volume of rectal tissue receiving the prescription dose or higher was 0.0 cc. This falls under our thresholds tolerance of 1.0 cc.  Impression: The prostate seed implant appears to show adequate target coverage and appropriate rectal sparing.  Plan:  The patient will continue to follow with urology for ongoing PSA determinations. I would anticipate a high likelihood for local tumor control with minimal risk for rectal morbidity.  ________________________________  Clarence Schneider, M.D.

## 2023-04-30 NOTE — Radiation Completion Notes (Signed)
 Patient Name: Clarence Schneider, Clarence Schneider MRN: [id number] Date of Birth: August 04, 1950 Referring Physician: GERMAIN BROTHERS, M.D. Date of Service: 2023-04-30 Radiation Oncologist: Adina Barge, M.D. [facility] -                              RADIATION ONCOLOGY END OF TREATMENT NOTE     Diagnosis: C61 Malignant neoplasm of prostate Staging on 2022-11-22: Malignant neoplasm of prostate (HCC) T=cT1c, N=cN0, M=cM0 Intent: Curative     ==========DELIVERED PLANS==========  Prostate Seed Implant Date: 2023-04-04   Plan Name: Prostate Seed Implant Site: Prostate Technique: Radioactive Seed Implant I-125 Mode: Brachytherapy Dose Per Fraction: 145 Gy Prescribed Dose (Delivered / Prescribed): 145 Gy / 145 Gy Prescribed Fxs (Delivered / Prescribed): 1 / 1     ==========ON TREATMENT VISIT DATES========== 2023-04-04     ==========UPCOMING VISITS==========

## 2023-05-27 ENCOUNTER — Inpatient Hospital Stay: Payer: Medicare Other | Attending: Physician Assistant | Admitting: *Deleted

## 2023-05-27 ENCOUNTER — Encounter: Payer: Self-pay | Admitting: *Deleted

## 2023-05-27 DIAGNOSIS — C61 Malignant neoplasm of prostate: Secondary | ICD-10-CM

## 2023-05-27 NOTE — Progress Notes (Signed)
 SCP reviewed and completed. Last colonoscopy  was 01/31/2018. Vaccines UTD.  Most recent PSA was 2.4 on Jan.28,2025 with Dr. Reginal Capra. I also reached out to Dr. Donavan Fuchs office about when pt is to resume testosterone  injections. Medication was paused when he had radiation. Pt forgot to ask Dr. Reginal Capra about when to resume when he had appt with him last month. Dr. Donavan Fuchs nurse will route message to him and let pt know.

## 2023-05-30 ENCOUNTER — Encounter: Payer: Self-pay | Admitting: *Deleted

## 2023-05-30 NOTE — Progress Notes (Signed)
[facility] called back to inform this RN  Navigator that Dr. Saddie Benders does not want to resume testosterone injections right now. I have called pt to make him aware.

## 2023-06-07 NOTE — Progress Notes (Signed)
RN placed call to Dr. Cindee Salt office, [facility] to schedule patient for urology follow up's.   Staff will reach out to patient to have follow up's scheduled.  No additional needs at this time.

## 2023-06-20 ENCOUNTER — Telehealth: Payer: Self-pay

## 2023-06-20 ENCOUNTER — Ambulatory Visit: Attending: Cardiology

## 2023-06-20 DIAGNOSIS — R002 Palpitations: Secondary | ICD-10-CM

## 2023-06-20 NOTE — Telephone Encounter (Signed)
 Spoke with pt and advised that Dr. Tomie China recommends a 14 day zio for palpations and FU appointment after. Pt verbalized understanding and had no additional questions.

## 2023-07-24 DIAGNOSIS — R002 Palpitations: Secondary | ICD-10-CM

## 2023-07-25 ENCOUNTER — Other Ambulatory Visit: Payer: Self-pay

## 2023-07-25 DIAGNOSIS — R42 Dizziness and giddiness: Secondary | ICD-10-CM | POA: Insufficient documentation

## 2023-07-25 DIAGNOSIS — R81 Glycosuria: Secondary | ICD-10-CM | POA: Insufficient documentation

## 2023-07-25 DIAGNOSIS — Z973 Presence of spectacles and contact lenses: Secondary | ICD-10-CM | POA: Insufficient documentation

## 2023-07-25 DIAGNOSIS — N401 Enlarged prostate with lower urinary tract symptoms: Secondary | ICD-10-CM | POA: Insufficient documentation

## 2023-07-25 DIAGNOSIS — Z8669 Personal history of other diseases of the nervous system and sense organs: Secondary | ICD-10-CM | POA: Insufficient documentation

## 2023-07-25 DIAGNOSIS — N529 Male erectile dysfunction, unspecified: Secondary | ICD-10-CM | POA: Insufficient documentation

## 2023-07-25 DIAGNOSIS — Z860101 Personal history of adenomatous and serrated colon polyps: Secondary | ICD-10-CM | POA: Insufficient documentation

## 2023-07-25 DIAGNOSIS — Z9109 Other allergy status, other than to drugs and biological substances: Secondary | ICD-10-CM | POA: Insufficient documentation

## 2023-07-25 DIAGNOSIS — M1990 Unspecified osteoarthritis, unspecified site: Secondary | ICD-10-CM | POA: Insufficient documentation

## 2023-07-25 DIAGNOSIS — Z7901 Long term (current) use of anticoagulants: Secondary | ICD-10-CM | POA: Insufficient documentation

## 2023-07-25 DIAGNOSIS — E119 Type 2 diabetes mellitus without complications: Secondary | ICD-10-CM | POA: Insufficient documentation

## 2023-07-29 ENCOUNTER — Ambulatory Visit: Payer: Medicare Other | Attending: Cardiology | Admitting: Cardiology

## 2023-07-29 ENCOUNTER — Encounter: Payer: Self-pay | Admitting: Cardiology

## 2023-07-29 VITALS — BP 126/78 | HR 56 | Ht 71.0 in | Wt 210.8 lb

## 2023-07-29 DIAGNOSIS — I2510 Atherosclerotic heart disease of native coronary artery without angina pectoris: Secondary | ICD-10-CM

## 2023-07-29 DIAGNOSIS — Z1321 Encounter for screening for nutritional disorder: Secondary | ICD-10-CM

## 2023-07-29 DIAGNOSIS — G4733 Obstructive sleep apnea (adult) (pediatric): Secondary | ICD-10-CM | POA: Diagnosis not present

## 2023-07-29 DIAGNOSIS — I4729 Other ventricular tachycardia: Secondary | ICD-10-CM | POA: Diagnosis not present

## 2023-07-29 DIAGNOSIS — I10 Essential (primary) hypertension: Secondary | ICD-10-CM

## 2023-07-29 DIAGNOSIS — E119 Type 2 diabetes mellitus without complications: Secondary | ICD-10-CM

## 2023-07-29 DIAGNOSIS — E782 Mixed hyperlipidemia: Secondary | ICD-10-CM

## 2023-07-29 NOTE — Patient Instructions (Addendum)
 Medication Instructions:    Stop taking Aspirin.   *If you need a refill on your cardiac medications before your next appointment, please call your pharmacy*   Lab Work: Your physician recommends that you have labs done in the office today. Your test included  basic metabolic panel, complete blood count, TSH, liver function, lipids, Vitamin D, and A1C   If you have labs (blood work) drawn today and your tests are completely normal, you will receive your results only by: MyChart Message (if you have MyChart) OR A paper copy in the mail If you have any lab test that is abnormal or we need to change your treatment, we will call you to review the results.   Testing/Procedures: None Ordered   Follow-Up: At [facility], you and your health needs are our priority.  As part of our continuing mission to provide you with exceptional heart care, we have created designated Provider Care Teams.  These Care Teams include your primary Cardiologist (physician) and Advanced Practice Providers (APPs -  Physician Assistants and Nurse Practitioners) who all work together to provide you with the care you need, when you need it.  We recommend signing up for the patient portal called "MyChart".  Sign up information is provided on this After Visit Summary.  MyChart is used to connect with patients for Virtual Visits (Telemedicine).  Patients are able to view lab/test results, encounter notes, upcoming appointments, etc.  Non-urgent messages can be sent to your provider as well.   To learn more about what you can do with MyChart, go to [email or web address].    Your next appointment:   9 month follow up   FDA-cleared personal EKG: The world's most clinically validated personal EKG, FDA-cleared to detect Atrial Fibrillation, Bradycardia, and Tachycardia. Clarence Schneider is the most reliable way to check in on your heart from home. Take your EKG from anywhere: Capture a medical-grade EKG in 30 seconds and get  an instant analysis right on your smartphone. Clarence Schneider is small enough to fit in your pocket, so you can take it with you anywhere. Easy to use: Simply place your fingers on the sensors--no wires, patches, or gels. Recommended by doctors: A trusted resource, Clarence Schneider is the #1 doctor-recommended personal EKG with more than 100 million EKGs recorded. Save or share your EKGs: With the press of a button, email your EKGs to your doctor or save them on your phone. Works with smartphones: Compatible with Event organiser and tablets. Check our compatibility chart. FSA/HSA eligible: Purchase using an FSA or HSA account (please confirm coverage with your insurance provider). Phone clip included with purchase, a $15 value. Conveniently take your device with you wherever you go.  [email or web address]   Step One- Record your EKG strip on [facility] app.   Step two- On Kardia EKG click "Download"   Step three- It will prompt you to make a password for this EKG. Please make the password "[email or web address]" so that we can view it.   Step four- Click on the little "upload" button (small box with an arrow in the middle) in the bottom left-hand corner of the screen.   Step five- Click "Save to Files"  Step six- Click on "On my iphone" and then "Pages" then press save in the top right-hand corner.   NOW GO TO MYCHART   Once on MyChart click "Messages"  Step one- Click "Send a message"  Step two- Click "Ask a medical question"   Step three- Click "Non urgent medical  question"   Step four- Click on Clarence Schneider's name.  Step five- Click on the small paperclip at the bottom of the screen  Step six- Click "Choose file"  Step seven- Pick the most recent EKG strip listed.   Once uploaded send the message!

## 2023-07-29 NOTE — Progress Notes (Signed)
 Cardiology Office Note:    Date:  07/29/2023   ID:  Clarence Schneider, DOB 1951/03/21, MRN [id number]  PCP:  Olive Bass, MD  Cardiologist:  Garwin Brothers, MD   Referring MD: Olive Bass, MD    ASSESSMENT:    1. Coronary artery disease involving native coronary artery of native heart without angina pectoris   2. Benign essential hypertension   3. Nonsustained ventricular tachycardia (HCC)   4. OSA treated with BiPAP   5. Type 2 diabetes mellitus without complication, without long-term current use of insulin (HCC)   6. Mixed dyslipidemia    PLAN:    In order of problems listed above:  Coronary artery disease: Secondary prevention stressed with the patient.  Importance of compliance with diet and medication stressed annual cholesterol screening.  He was advised to walk at least half an hour a day on a daily basis. Essential hypertension: Blood pressure is stable and diet was emphasized. Mixed dyslipidemia: On lipid-lowering medications he is fasting and will have complete blood work today.  He also request vitamin D in view of history of vitamin D deficiency and we will oblige. Diabetes mellitus: Managed by primary care.  Will do hemoglobin A1c. Patient will be seen in follow-up appointment in 9 months or earlier if the patient has any concerns.    Medication Adjustments/Labs and Tests Ordered: Current medicines are reviewed at length with the patient today.  Concerns regarding medicines are outlined above.  No orders of the defined types were placed in this encounter.  No orders of the defined types were placed in this encounter.    No chief complaint on file.    History of Present Illness:    Clarence Schneider is a 73 y.o. male.  Patient has past medical history of coronary artery disease, essential hypertension, mixed dyslipidemia, diabetes mellitus, nonsustained ventricular tachycardia and sleep apnea.  He denies any problems at this time and take care of  activities of daily living.  No chest pain orthopnea or PND.  At the time of my evaluation, the patient is alert awake oriented and in no distress.  He walks on a regular basis.  Past Medical History:  Diagnosis Date   Adenomatous polyp of colon 03/20/2016   Overview:   2007     Formatting of this note might be different from the original.  2007     Allergic rhinitis    Anticoagulant long-term use    plavix/ asa 81mg --- managed by cardiology   Arthritis 02/16/2020   Arthritis of right knee 03/26/2013   Back strain 07/05/2017   2019: right     Benign essential hypertension 12/27/2014   Formatting of this note might be different from the original.  1997: dx  2002: rx     Benign localized prostatic hyperplasia with lower urinary tract symptoms (LUTS)    Bladder disorder 05/21/2016   CAD S/P percutaneous coronary angioplasty 06/21/2017   Chronic frontal sinusitis 10/23/2017   2019     Formatting of this note might be different from the original.  2019     Chronic lumbar pain 03/19/2016   Chronic pain of both knees 07/12/2016   Chronic right shoulder pain 07/12/2016   Coronary artery disease    stents     Coronary artery disease involving native coronary artery of native heart 03/19/2016   cardiologist---  dr Consuello [place];    onset cath  2005: cath diffuse disease, dissection/occlusion RCA, stent x3;   abn NUC  06-03-2017 , Cath 06-21-2017  balloon angioplasty focal area restenosis distal RCA;   stress echo 06-27-2018  no evidence ischemia   DDD (degenerative disc disease), cervical 07/18/2021   DDD (degenerative disc disease), lumbar 07/18/2021   Dizziness 12/27/2014   ED (erectile dysfunction)    Enlarged prostate with lower urinary tract symptoms (LUTS) 01/08/2022   Formatting of this note might be different from the original. 06/2021: cysto Formatting of this note might be different from the original. 06/2021: cysto     Environmental allergies    Facet arthropathy, cervical 07/18/2021    Facet arthropathy, lumbar 07/18/2021   Gastroesophageal reflux disease 02/16/2020   Glucosuria    Herpes zoster without complication 10/05/2019   Formatting of this note might be different from the original.  2021: right T7, no rash     History of adenomatous polyp of colon    History of GI bleed 01/07/2018   Formatting of this note might be different from the original. 2019: melena, GI eval, continue ASA   History of trigeminal neuralgia    Hyperlipemia    Hypertension    Hypotestosteronism 05/27/2015   Impingement syndrome of left shoulder 06/03/2014   Impotence 05/26/2015   Intolerance of continuous positive airway pressure (CPAP) ventilation 04/18/2021   Lateral epicondylitis of right elbow 01/04/2016   Overview:   2017     Formatting of this note might be different from the original.  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 1998: LDL 187 TG 317   Neck pain 07/18/2021   Neuromuscular disorder (HCC) 02/16/2020   Takes Gabapentin  Formatting of this note might be different from the original.  Formatting of this note might be different from the original.  Takes Gabapentin     Nonspecific dizziness    04-02-2023  per pt if stands to fast   Nonsustained ventricular tachycardia (HCC) 06/14/2017   noted on event moniotr 06-03-2017 asymptomative by patient , evaluated by EP and had 30 day event monitor 07-19-2017 unremarkable   OA (osteoarthritis)    Obstructive sleep apnea 03/19/2016   Overview:  2012: dx, AHI 69 with desaturation, BiPAP  Formatting of this note might be different from the original. Split-night sleep study 10/25/2017, AHI  63.8, CPAP 20     OSA treated with BiPAP 03/19/2016   Overview:   2012: dx, AHI 69 with desaturation, BiPAP     Formatting of this note might be different from the original.  Split-night sleep study 10/25/2017, AHI 63.8, CPAP 20     Formatting of this note might be different from the original.  Titration 01/13/2020 BiPAP 25/20  Split-night sleep study 10/25/2017, AHI 63.8, CPAP 20     Peripheral arterial disease (HCC) 03/19/2016   Pinched nerve    in back     Pre-diabetes 06/18/2016   S/P arthroscopy of right shoulder 06/28/2014   S/P placement of hypoglossal nerve stimulator 09/13/2021   @ [facility] ;   INSPIRE Apnea stimulator placement   S/P total knee arthroplasty 05/11/2013   S/P total knee replacement using cement 11/30/2013   Screening for prostate cancer 03/19/2016  Sebaceous cyst 05/26/2019   Formatting of this note might be different from the original.  2021: post neck excision     Shortness of breath    with exertion     Sinusitis 09/02/2015   Overview:   2018:      Overview:   2018: eth     Sinusitis, acute ethmoidal 09/02/2015   Formatting of this note might be different from the original.  2018: eth  03/28/2020     Skin abrasion    under both arms; uses Lotrisone cream twice a day     Sleep apnea    cpap doesnt use reg     Slow heart rate 06/14/2017   Stage 3a chronic kidney disease (HCC) 03/28/2020   Subacromial bursitis of right shoulder joint 01/04/2016   Overview:   2017     Formatting of this note might be different from the original.  2017     Trigeminal neuralgia 03/19/2016   Trigger ring finger of left hand 05/29/2022   Formatting of this note might be different from the original. 05/29/2022 Formatting of this note might be different from the original. 05/29/2022   Type 2 diabetes mellitus (HCC)    followed by pcp   (04-02-2023  does not check blood sugar,  actually did not know he takes faxiga for diabetes)   Type 2 diabetes mellitus without complication, without  long-term current use of insulin (HCC) 06/18/2016   Formatting of this note might be different from the original.  06/23/2020 : 132/6.9     Urinary incontinence 04/21/2021   Formatting of this note might be different from the original. 04/21/2021:   Verrucous skin lesion 03/28/2020   Formatting of this note might be different from the original.  03/28/2020 : right deltoid region     Wears glasses     Past Surgical History:  Procedure Laterality Date   APPENDECTOMY  1968   CARPAL TUNNEL RELEASE Bilateral    left x2  1995 & 2001;   right 1997   CATARACT EXTRACTION W/ INTRAOCULAR LENS IMPLANT Bilateral 2018   COLONOSCOPY WITH ESOPHAGOGASTRODUODENOSCOPY (EGD)  2019   CORONARY ANGIOPLASTY WITH STENT PLACEMENT  1995   per pt @ [facility] 1995  x2  BMS for RCA dissection/ occlusion   CORONARY ANGIOPLASTY WITH STENT PLACEMENT  1996   @ [facility];   for chest pain;   x1 BMS to RCA   CORONARY BALLOON ANGIOPLASTY N/A 06/21/2017   Procedure: CORONARY BALLOON ANGIOPLASTY;  Surgeon: Lucendia Rusk, MD;  Location: [facility] INVASIVE CV LAB;  Service: Cardiovascular;  Laterality: N/A;   CYSTOSCOPY  04/04/2023   Procedure: CYSTOSCOPY FLEXIBLE;  Surgeon: Wally Gunnels, MD;  Location: [facility];  Service: Urology;;   HEMORRHOID SURGERY  2017   IMPLANTATION OF HYPOGLOSSAL NERVE STIMULATOR  09/13/2021   @[facility]  by dr h. patwa;  INSPIRE Apnea stimupator placment   INGUINAL HERNIA REPAIR Right    1990s   LEFT HEART CATH AND CORONARY ANGIOGRAPHY N/A 06/21/2017   Procedure: LEFT HEART CATH AND CORONARY ANGIOGRAPHY;  Surgeon: Lucendia Rusk, MD;  Location: [facility] INVASIVE CV LAB;  Service: Cardiovascular;  Laterality: N/A;   RADIOACTIVE SEED IMPLANT N/A 04/04/2023   Procedure: RADIOACTIVE SEED IMPLANT/BRACHYTHERAPY IMPLANT;  Surgeon: Wally Gunnels, MD;  Location[facility];  Service: Urology;  Laterality: N/A;   SHOULDER ARTHROSCOPY Bilateral    left approx 2004 ;   right 2005    TONSILLECTOMY AND ADENOIDECTOMY  1972  TOTAL KNEE ARTHROPLASTY Left 05/11/2013   Procedure: LEFT TOTAL KNEE ARTHROPLASTY;  Surgeon: Dannielle Huh, MD;  Location: [facility] OR;  Service: Orthopedics;  Laterality: Left;   TOTAL KNEE ARTHROPLASTY Right 11/30/2013   Procedure: TOTAL KNEE ARTHROPLASTY;  Surgeon: Dannielle Huh, MD;  Location: [facility] OR;  Service: Orthopedics;  Laterality: Right;   ULNAR NERVE TRANSPOSITION Bilateral 2002   approx    Current Medications: Current Meds  Medication Sig   amoxicillin-clavulanate (AUGMENTIN) 875-125 MG tablet Take 1 tablet by mouth 2 (two) times daily.   ascorbic acid (VITAMIN C) 1000 MG tablet Take 1,000 mg by mouth daily.   aspirin 81 MG EC tablet Take 81 mg by mouth daily.    carvedilol (COREG) 12.5 MG tablet Take 1 tablet (12.5 mg total) by mouth 2 (two) times daily.   [Paused] clopidogrel (PLAVIX) 75 MG tablet Take 1 tablet (75 mg total) by mouth daily.   ezetimibe (ZETIA) 10 MG tablet Take 1 tablet (10 mg total) by mouth daily.   FARXIGA 10 MG TABS tablet Take 10 mg by mouth daily.   fenofibrate 160 MG tablet Take 1 tablet (160 mg total) by mouth every evening.   fexofenadine (ALLEGRA) 180 MG tablet Take 180 mg by mouth daily.   gabapentin (NEURONTIN) 300 MG capsule Take 300 mg by mouth 3 (three) times daily as needed for pain.   hydrochlorothiazide (MICROZIDE) 12.5 MG capsule Take 1 capsule (12.5 mg total) by mouth daily.   montelukast (SINGULAIR) 10 MG tablet Take 10 mg by mouth at bedtime.   [Paused] Multiple Vitamins-Minerals (MULTIVITAMIN WITH MINERALS) tablet Take 1 tablet by mouth daily.   nitroGLYCERIN (NITROSTAT) 0.4 MG SL tablet Place 1 tablet (0.4 mg total) under the tongue every 5 (five) minutes as needed for chest pain.   omega-3 acid ethyl esters (LOVAZA) 1 g capsule Take 2 capsules (2 g total) by mouth 2 (two) times daily.   pyridoxine (B-6) 200 MG tablet Take 200 mg by mouth daily.   rosuvastatin (CRESTOR) 40 MG tablet Take 1 tablet (40 mg  total) by mouth daily.   [Paused] sildenafil (REVATIO) 20 MG tablet Take 20 mg by mouth as needed for erectile dysfunction.   tamsulosin (FLOMAX) 0.4 MG CAPS capsule Take 0.4 mg by mouth 2 (two) times daily.   telmisartan (MICARDIS) 80 MG tablet TAKE 1 TABLET BY MOUTH DAILY.   Tetrahydrozoline-Zn Sulfate (EYE DROPS RELIEF OP) Place 1 drop into both eyes 2 (two) times daily.   triamcinolone (NASACORT) 55 MCG/ACT AERO nasal inhaler Place 2 sprays into the nose as needed (allergies or rhinitis).   vitamin B-12 (CYANOCOBALAMIN) 1000 MCG tablet Take 1,000 mcg by mouth daily.     Allergies:   Oxycontin [oxycodone hcl], Metoclopramide, Oxycodone, and Tape   Social History   Socioeconomic History   Marital status: Married    Spouse name: Not on file   Number of children: Not on file   Years of education: Not on file   Highest education level: Not on file  Occupational History   Not on file  Tobacco Use   Smoking status: Never   Smokeless tobacco: Never  Vaping Use   Vaping status: Never Used  Substance and Sexual Activity   Alcohol use: Yes    Alcohol/week: 1.0 standard drink of alcohol    Types: 1 Glasses of wine per week   Drug use: No    Comment: per pt last used 1970s   Sexual activity: Not on file  Other Topics Concern  Not on file  Social History Narrative   Not on file   Social Drivers of Health   Financial Resource Strain: Low Risk  (01/07/2018)   Received from [facility] visits prior to 06/16/2022., [facility] visits prior to 06/16/2022.   Overall Financial Resource Strain (CARDIA)    Difficulty of Paying Living Expenses: Not hard at all  Food Insecurity: No Food Insecurity (01/29/2023)   Hunger Vital Sign    Worried About Running Out of Food in the Last Year: Never true    Ran Out of Food in the Last Year: Never true  Transportation Needs: No Transportation Needs (01/29/2023)   PRAPARE - Scientist, research (physical sciences) (Medical): No    Lack of Transportation (Non-Medical): No  Physical Activity: Sufficiently Active (01/07/2018)   [facility] visits prior to 06/16/2022., [facility] visits prior to 06/16/2022.   Exercise Vital Sign    Days of Exercise per Week: 3 days    Minutes of Exercise per Session: 60 min  Stress: No Stress Concern Present (01/07/2018)   [facility] visits prior to 06/16/2022., [facility] visits prior to 06/16/2022.   [employer] of Occupational Health - Occupational Stress Questionnaire    Feeling of Stress : Not at all  Social Connections: Socially Integrated (01/07/2018)   [facility] from [facility] visits prior to 06/16/2022., [facility] visits prior to 06/16/2022.   Social Advertising account executive [NHANES]    Frequency of Communication with Friends and Family: Twice a week    Frequency of Social Gatherings with Friends and Family: Twice a week    Attends Religious Services: 1 to 4 times per year    Active Member of [employer] or Organizations: Yes    Attends Engineer, structural: 1 to 4 times per year    Marital Status: Married     Family History: The patient's family history includes COPD in his brother; Cancer in his brother and father; Diabetes in his father; Heart attack in his brother; Hyperlipidemia in his father; Hypertension in his father and mother; Prostate cancer in his brother.  ROS:   Please see the history of present illness.    All other systems reviewed and are negative.  EKGs/Labs/Other Studies Reviewed:    The following studies were reviewed today: .Aaron Aas   I discussed my findings with the patient at length   Recent Labs: 10/25/2022: ALT 36; Platelets 179; TSH 2.010 04/04/2023: BUN 19; Creatinine, Ser 1.30; Hemoglobin 13.6; Potassium 4.1; Sodium 140  Recent Lipid Panel     Component Value Date/Time   CHOL 154 10/25/2022 0857   TRIG 95 10/25/2022 0857   HDL 79 10/25/2022 0857   CHOLHDL 1.9 10/25/2022 0857   LDLCALC 58 10/25/2022 0857    Physical Exam:    VS:  BP 126/78   Pulse (!) 56   Ht 5\' 11"  (1.803 m)   Wt 210 lb 12.8 oz (95.6 kg)   SpO2 98%   BMI 29.40 kg/m     Wt Readings from Last 3 Encounters:  07/29/23 210 lb 12.8 oz (95.6 kg)  04/25/23 206 lb (93.4 kg)  04/25/23 206 lb (93.4 kg)     GEN: Patient is in no acute distress HEENT: Normal NECK: No JVD; No carotid bruits LYMPHATICS: No lymphadenopathy CARDIAC: Hear sounds regular, 2/6 systolic  murmur at the apex. RESPIRATORY:  Clear to auscultation without rales, wheezing or rhonchi  ABDOMEN: Soft, non-tender, non-distended MUSCULOSKELETAL:  No edema; No deformity  SKIN: Warm and dry NEUROLOGIC:  Alert and oriented x 3 PSYCHIATRIC:  Normal affect   Signed, Nelia Balzarine, MD  07/29/2023 8:13 AM    [facility]

## 2023-07-30 LAB — HEPATIC FUNCTION PANEL
ALT: 35 IU/L (ref 0–44)
AST: 34 IU/L (ref 0–40)
Albumin: 4.2 g/dL (ref 3.8–4.8)
Alkaline Phosphatase: 88 IU/L (ref 44–121)
Bilirubin Total: 0.4 mg/dL (ref 0.0–1.2)
Bilirubin, Direct: 0.2 mg/dL (ref 0.00–0.40)
Total Protein: 6.4 g/dL (ref 6.0–8.5)

## 2023-07-30 LAB — BASIC METABOLIC PANEL WITH GFR
BUN/Creatinine Ratio: 13 (ref 10–24)
BUN: 16 mg/dL (ref 8–27)
CO2: 24 mmol/L (ref 20–29)
Calcium: 9.7 mg/dL (ref 8.6–10.2)
Chloride: 104 mmol/L (ref 96–106)
Creatinine, Ser: 1.24 mg/dL (ref 0.76–1.27)
Glucose: 154 mg/dL — ABNORMAL HIGH (ref 70–99)
Potassium: 4 mmol/L (ref 3.5–5.2)
Sodium: 141 mmol/L (ref 134–144)
eGFR: 62 mL/min/{1.73_m2} (ref >59)

## 2023-07-30 LAB — TSH: TSH: 2.07 u[IU]/mL (ref 0.450–4.500)

## 2023-07-30 LAB — LIPID PANEL
Chol/HDL Ratio: 1.9 ratio (ref 0.0–5.0)
Cholesterol, Total: 145 mg/dL (ref 100–199)
HDL: 75 mg/dL (ref >39)
LDL Chol Calc (NIH): 52 mg/dL (ref 0–99)
Triglycerides: 96 mg/dL (ref 0–149)
VLDL Cholesterol Cal: 18 mg/dL (ref 5–40)

## 2023-07-30 LAB — CBC
Hematocrit: 39.2 % (ref 37.5–51.0)
Hemoglobin: 13.1 g/dL (ref 13.0–17.7)
MCH: 30.9 pg (ref 26.6–33.0)
MCHC: 33.4 g/dL (ref 31.5–35.7)
MCV: 93 fL (ref 79–97)
Platelets: 198 10*3/uL (ref 150–450)
RBC: 4.24 x10E6/uL (ref 4.14–5.80)
RDW: 13.1 % (ref 11.6–15.4)
WBC: 4.5 10*3/uL (ref 3.4–10.8)

## 2023-07-30 LAB — HEMOGLOBIN A1C
Est. average glucose Bld gHb Est-mCnc: 163 mg/dL
Hgb A1c MFr Bld: 7.3 % — ABNORMAL HIGH (ref 4.8–5.6)

## 2023-07-30 LAB — VITAMIN D 25 HYDROXY (VIT D DEFICIENCY, FRACTURES): Vit D, 25-Hydroxy: 50 ng/mL (ref 30.0–100.0)

## 2023-09-07 ENCOUNTER — Other Ambulatory Visit: Payer: Self-pay | Admitting: Cardiology

## 2023-10-02 ENCOUNTER — Other Ambulatory Visit: Payer: Self-pay | Admitting: Cardiology

## 2023-10-14 ENCOUNTER — Other Ambulatory Visit: Payer: Self-pay | Admitting: Cardiology

## 2023-10-14 NOTE — Telephone Encounter (Signed)
 Rx refill sent to pharmacy.

## 2023-10-31 ENCOUNTER — Other Ambulatory Visit: Payer: Self-pay | Admitting: Cardiology

## 2023-11-07 ENCOUNTER — Other Ambulatory Visit: Payer: Self-pay | Admitting: Cardiology

## 2023-11-08 ENCOUNTER — Other Ambulatory Visit: Payer: Self-pay | Admitting: Cardiology

## 2023-11-22 ENCOUNTER — Telehealth: Payer: Self-pay

## 2023-11-22 NOTE — Telephone Encounter (Signed)
   Name: Clarence Schneider  DOB: 11/10/1950  MRN: [id number]  Primary Cardiologist: Jennifer JONELLE Crape, MD   Preoperative team, please contact this patient and set up a phone call appointment for further preoperative risk assessment. Please obtain consent and complete medication review. Thank you for your help.  I confirm that guidance regarding antiplatelet and oral anticoagulation therapy has been completed and, if necessary, noted below.  None requested.  I also confirmed the patient resides in the state of [place] . As per [facility] Medical Board telemedicine laws, the patient must reside in the state in which the provider is licensed.   Josefa CHRISTELLA Beauvais, NP 11/22/2023, 2:59 PM [facility]

## 2023-11-22 NOTE — Telephone Encounter (Signed)
 Appointment scheduled for Friday, November 29, 2023 @ 9am Med req and consent complete. Call patient at [phone]

## 2023-11-22 NOTE — Telephone Encounter (Signed)
   Pre-operative Risk Assessment    Patient Name: Clarence Schneider  DOB: 02/17/1951 MRN: [id number]   Date of last office visit: 07/29/23 Date of next office visit: N/A   Request for Surgical Clearance    Procedure:  excision of neck lipoma  Date of Surgery:  Clearance TBD                                Surgeon:  Dr. Penne Killian Surgeon's Group or Practice Name:  [facility] Phone number:  [phone] Fax number:  [phone]   Type of Clearance Requested:   - Medical    Type of Anesthesia:  General    Additional requests/questions:    Clarence Schneider   11/22/2023, 2:34 PM

## 2023-11-22 NOTE — Telephone Encounter (Signed)
  Patient Consent for Virtual Visit         Clarence Schneider has provided verbal consent on 11/22/2023 for a virtual visit (video or telephone).  Appointment scheduled for Friday, November 29, 2023 @ 9am Med req and consent complete. Call patient at [phone]   CONSENT FOR VIRTUAL VISIT FOR:  Clarence Schneider  By participating in this virtual visit I agree to the following:  I hereby voluntarily request, consent and authorize [facility] and its employed or contracted physicians, physician assistants, nurse practitioners or other licensed health care professionals (the Practitioner), to provide me with telemedicine health care services (the "Services) as deemed necessary by the treating Practitioner. I acknowledge and consent to receive the Services by the Practitioner via telemedicine. I understand that the telemedicine visit will involve communicating with the Practitioner through live audiovisual communication technology and the disclosure of certain medical information by electronic transmission. I acknowledge that I have been given the opportunity to request an in-person assessment or other available alternative prior to the telemedicine visit and am voluntarily participating in the telemedicine visit.  I understand that I have the right to withhold or withdraw my consent to the use of telemedicine in the course of my care at any time, without affecting my right to future care or treatment, and that the Practitioner or I may terminate the telemedicine visit at any time. I understand that I have the right to inspect all information obtained and/or recorded in the course of the telemedicine visit and may receive copies of available information for a reasonable fee.  I understand that some of the potential risks of receiving the Services via telemedicine include:  Delay or interruption in medical evaluation due to technological equipment failure or disruption; Information transmitted  may not be sufficient (e.g. poor resolution of images) to allow for appropriate medical decision making by the Practitioner; and/or  In rare instances, security protocols could fail, causing a breach of personal health information.  Furthermore, I acknowledge that it is my responsibility to provide information about my medical history, conditions and care that is complete and accurate to the best of my ability. I acknowledge that Practitioner's advice, recommendations, and/or decision may be based on factors not within their control, such as incomplete or inaccurate data provided by me or distortions of diagnostic images or specimens that may result from electronic transmissions. I understand that the practice of medicine is not an exact science and that Practitioner makes no warranties or guarantees regarding treatment outcomes. I acknowledge that a copy of this consent can be made available to me via my patient portal [facility] MyChart), or I can request a printed copy by calling the office of [facility].    I understand that my insurance will be billed for this visit.   I have read or had this consent read to me. I understand the contents of this consent, which adequately explains the benefits and risks of the Services being provided via telemedicine.  I have been provided ample opportunity to ask questions regarding this consent and the Services and have had my questions answered to my satisfaction. I give my informed consent for the services to be provided through the use of telemedicine in my medical care

## 2023-11-29 ENCOUNTER — Ambulatory Visit: Attending: Cardiovascular Disease | Admitting: Nurse Practitioner

## 2023-11-29 DIAGNOSIS — Z01810 Encounter for preprocedural cardiovascular examination: Secondary | ICD-10-CM

## 2023-11-29 NOTE — Progress Notes (Signed)
 Virtual Visit via Telephone Note   Because of Clarence Schneider co-morbid illnesses, he is at least at moderate risk for complications without adequate follow up.  This format is felt to be most appropriate for this patient at this time.  Due to technical limitations with video connection (technology), today's appointment will be conducted as an audio only telehealth visit, and Clarence Schneider verbally agreed to proceed in this manner.   All issues noted in this document were discussed and addressed.  No physical exam could be performed with this format.  Evaluation Performed:  Preoperative cardiovascular risk assessment _____________   Date:  11/29/2023   Patient ID:  Clarence Schneider, DOB July 01, 1950, MRN [id number] Patient Location:  Home Provider location:   Office  Primary Care Provider:  [name], MD Primary Cardiologist:  Jennifer JONELLE Crape, MD  Chief Complaint / Patient Profile   73 y.o. y/o male with a h/o CAD s/p PTCA-distal RCA in 2019, NSVT, hypertension, hyperlipidemia, type 2 diabetes, and OSA who is pending excision of back lipoma with Dr. Penne Killian of our [facility] and presents today for telephonic preoperative cardiovascular risk assessment.  History of Present Illness    Clarence Schneider is a 73 y.o. male who presents via audio/video conferencing for a telehealth visit today.  Pt was last seen in cardiology clinic on 07/29/2023 by Dr. Crape. At that time [name] was doing well.  The patient is now pending procedure as outlined above. Since his last visit, he has done well from a cardiac standpoint.   He denies chest pain, palpitations, dyspnea, pnd, orthopnea, n, v, dizziness, syncope, edema, weight gain, or early satiety. All other systems reviewed and are otherwise negative except as noted above.   Past Medical History    Past Medical History:  Diagnosis Date   Adenomatous polyp of colon 03/20/2016   Overview:   2007     Formatting of this  note might be different from the original.  2007     Allergic rhinitis    Anticoagulant long-term use    plavix / asa 81mg --- managed by cardiology   Arthritis 02/16/2020   Arthritis of right knee 03/26/2013   Back strain 07/05/2017   2019: right     Benign essential hypertension 12/27/2014   Formatting of this note might be different from the original.  1997: dx  2002: rx     Benign localized prostatic hyperplasia with lower urinary tract symptoms (LUTS)    Bladder disorder 05/21/2016   CAD S/P percutaneous coronary angioplasty 06/21/2017   Chronic frontal sinusitis 10/23/2017   2019     Formatting of this note might be different from the original.  2019     Chronic lumbar pain 03/19/2016   Chronic pain of both knees 07/12/2016   Chronic right shoulder pain 07/12/2016   Coronary artery disease    stents     Coronary artery disease involving native coronary artery of native heart 03/19/2016   cardiologist---  dr elfredia;    onset cath  2005: cath diffuse disease, dissection/occlusion RCA, stent x3;   abn NUC 06-03-2017 , Cath 06-21-2017  balloon angioplasty focal area restenosis distal RCA;   stress echo 06-27-2018  no evidence ischemia   DDD (degenerative disc disease), cervical 07/18/2021   DDD (degenerative disc disease), lumbar 07/18/2021   Dizziness 12/27/2014   ED (erectile dysfunction)    Enlarged prostate with lower urinary tract symptoms (LUTS) 01/08/2022   Formatting of this  note might be different from the original. 06/2021: cysto Formatting of this note might be different from the original. 06/2021: cysto     Environmental allergies    Facet arthropathy, cervical 07/18/2021   Facet arthropathy, lumbar 07/18/2021   Gastroesophageal reflux disease 02/16/2020   Glucosuria    Herpes zoster without complication 10/05/2019   Formatting of this note might be different from the original.  2021: right T7, no rash     History of adenomatous polyp of colon    History of GI bleed  01/07/2018   Formatting of this note might be different from the original. 2019: melena, GI eval, continue ASA   History of trigeminal neuralgia    Hyperlipemia    Hypertension    Hypotestosteronism 05/27/2015   Impingement syndrome of left shoulder 06/03/2014   Impotence 05/26/2015   Intolerance of continuous positive airway pressure (CPAP) ventilation 04/18/2021   Lateral epicondylitis of right elbow 01/04/2016   Overview:   2017     Formatting of this note might be different from the original.  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 1998: LDL 187 TG 317   Neck pain 07/18/2021   Neuromuscular disorder (HCC) 02/16/2020   Takes Gabapentin   Formatting of this note might be different from the original.  Formatting of this note might be different from the original.  Takes Gabapentin      Nonspecific dizziness    04-02-2023  per pt if stands to fast   Nonsustained ventricular tachycardia (HCC) 06/14/2017   noted on event moniotr 06-03-2017 asymptomative by patient , evaluated by EP and had 30 day event monitor 07-19-2017 unremarkable   OA (osteoarthritis)    Obstructive sleep apnea 03/19/2016   Overview:  2012: dx, AHI 69 with desaturation, BiPAP  Formatting of this note might be different from the original. Split-night sleep study 10/25/2017, AHI 63.8, CPAP 20     OSA treated with BiPAP 03/19/2016   Overview:   2012: dx, AHI 69 with desaturation, BiPAP     Formatting of this note might be different from the original.  Split-night sleep study 10/25/2017, AHI 63.8, CPAP 20     Formatting of this note might be different from the original.   Titration 01/13/2020 BiPAP 25/20  Split-night sleep study 10/25/2017, AHI 63.8, CPAP 20     Peripheral arterial disease (HCC) 03/19/2016   Pinched nerve    in back     Pre-diabetes 06/18/2016   S/P arthroscopy of right shoulder 06/28/2014   S/P placement of hypoglossal nerve stimulator 09/13/2021   @ [id number] ;   INSPIRE Apnea stimulator placement   S/P total knee arthroplasty 05/11/2013   S/P total knee replacement using cement 11/30/2013   Screening for prostate cancer 03/19/2016   Sebaceous cyst 05/26/2019   Formatting of this note might be different from the original.  2021: post neck excision     Shortness of breath    with exertion     Sinusitis 09/02/2015   Overview:   2018:      Overview:   2018: eth     Sinusitis, acute ethmoidal 09/02/2015  Formatting of this note might be different from the original.  2018: eth  03/28/2020     Skin abrasion    under both arms; uses Lotrisone cream twice a day     Sleep apnea    cpap doesnt use reg     Slow heart rate 06/14/2017   Stage 3a chronic kidney disease (HCC) 03/28/2020   Subacromial bursitis of right shoulder joint 01/04/2016   Overview:   2017     Formatting of this note might be different from the original.  2017     Trigeminal neuralgia 03/19/2016   Trigger ring finger of left hand 05/29/2022   Formatting of this note might be different from the original. 05/29/2022 Formatting of this note might be different from the original. 05/29/2022   Type 2 diabetes mellitus (HCC)    followed by pcp   (04-02-2023  does not check blood sugar,  actually did not know he takes faxiga for diabetes)   Type 2 diabetes mellitus without complication, without long-term current use of insulin (HCC) 06/18/2016   Formatting of this note might be different from the original.  06/23/2020 : 132/6.9     Urinary incontinence 04/21/2021   Formatting of this note might be different from the original. 04/21/2021:   Verrucous skin lesion 03/28/2020    Formatting of this note might be different from the original.  03/28/2020 : right deltoid region     Wears glasses    Past Surgical History:  Procedure Laterality Date   APPENDECTOMY  1968   CARPAL TUNNEL RELEASE Bilateral    left x2  1995 & 2001;   right 1997   CATARACT EXTRACTION W/ INTRAOCULAR LENS IMPLANT Bilateral 2018   COLONOSCOPY WITH ESOPHAGOGASTRODUODENOSCOPY (EGD)  2019   CORONARY ANGIOPLASTY WITH STENT PLACEMENT  1995   per pt @ [facility] 1995  x2  BMS for RCA dissection/ occlusion   CORONARY ANGIOPLASTY WITH STENT PLACEMENT  1996   @ [facility];   for chest pain;   x1 BMS to RCA   CORONARY BALLOON ANGIOPLASTY N/A 06/21/2017   Procedure: CORONARY BALLOON ANGIOPLASTY;  Surgeon: Dann Candyce RAMAN, MD;  Location: [facility] INVASIVE CV LAB;  Service: Cardiovascular;  Laterality: N/A;   CYSTOSCOPY  04/04/2023   Procedure: CYSTOSCOPY FLEXIBLE;  Surgeon: Marda General, MD;  Location: [facility];  Service: Urology;;   HEMORRHOID SURGERY  2017   IMPLANTATION OF HYPOGLOSSAL NERVE STIMULATOR  09/13/2021   @[facility]  by dr h. patwa;  INSPIRE Apnea stimupator placment   INGUINAL HERNIA REPAIR Right    1990s   LEFT HEART CATH AND CORONARY ANGIOGRAPHY N/A 06/21/2017   Procedure: LEFT HEART CATH AND CORONARY ANGIOGRAPHY;  Surgeon: Dann Candyce RAMAN, MD;  Location[facility] INVASIVE CV LAB;  Service: Cardiovascular;  Laterality: N/A;   RADIOACTIVE SEED IMPLANT N/A 04/04/2023   Procedure: RADIOACTIVE SEED IMPLANT/BRACHYTHERAPY IMPLANT;  Surgeon: Marda General, MD;  Location: [facility];  Service: Urology;  Laterality: N/A;   SHOULDER ARTHROSCOPY Bilateral    left approx 2004 ;   right 2005   TONSILLECTOMY AND ADENOIDECTOMY  1972   TOTAL KNEE ARTHROPLASTY Left 05/11/2013   Procedure: LEFT TOTAL KNEE ARTHROPLASTY;  Surgeon: Marcey Raman, MD;  Location: [facility] OR;  Service: Orthopedics;  Laterality: Left;   TOTAL KNEE ARTHROPLASTY Right 11/30/2013   Procedure: TOTAL KNEE  ARTHROPLASTY;  Surgeon: Marcey Raman, MD;  Location: [facility] OR;  Service: Orthopedics;  Laterality: Right;   ULNAR NERVE TRANSPOSITION Bilateral 2002  approx    Allergies  Allergies  Allergen Reactions   Oxycontin  [Oxycodone  Hcl] Other (See Comments)    hallucinations   Metoclopramide  Other (See Comments)    Makes me act funny EPS   Oxycodone  Itching and Rash    Itching, hallucinations    Tape Rash and Other (See Comments)    paper tape    Home Medications    Prior to Admission medications   Medication Sig Start Date End Date Taking? Authorizing Provider  amoxicillin -clavulanate (AUGMENTIN ) 875-125 MG tablet Take 1 tablet by mouth 2 (two) times daily. 07/23/23   [provider]  ascorbic acid (VITAMIN C) 1000 MG tablet Take 1,000 mg by mouth daily.    [provider]  aspirin  81 MG EC tablet Take 81 mg by mouth daily.     [provider]  carvedilol  (COREG ) 12.5 MG tablet TAKE 1 TABLET BY MOUTH TWO TIMES DAILY. 10/02/23   Revankar, Jennifer SAUNDERS, MD  clopidogrel  (PLAVIX ) 75 MG tablet TAKE 1 TABLET BY MOUTH DAILY. 11/08/23   Revankar, Rajan R, MD  cycloSPORINE (RESTASIS) 0.05 % ophthalmic emulsion Place 1 drop into both eyes daily. Patient not taking: Reported on 07/29/2023    [provider]  ezetimibe  (ZETIA ) 10 MG tablet TAKE 1 TABLET BY MOUTH DAILY. 10/14/23   Revankar, Jennifer SAUNDERS, MD  FARXIGA 10 MG TABS tablet Take 10 mg by mouth daily. 07/26/21   [provider]  fenofibrate  160 MG tablet TAKE 1 TABLET BY MOUTH EVERY EVENING. 09/10/23   Revankar, Jennifer SAUNDERS, MD  fexofenadine (ALLEGRA) 180 MG tablet Take 180 mg by mouth daily. 11/21/18   [provider]  gabapentin  (NEURONTIN ) 300 MG capsule Take 300 mg by mouth 3 (three) times daily as needed for pain. 01/01/20   [provider]  hydrochlorothiazide  (MICROZIDE ) 12.5 MG capsule TAKE 1 CAPSULE BY MOUTH DAILY. 10/02/23   Revankar, Jennifer SAUNDERS, MD  montelukast (SINGULAIR) 10 MG tablet Take 10 mg  by mouth at bedtime. 12/30/18   [provider]  Multiple Vitamins-Minerals (MULTIVITAMIN WITH MINERALS) tablet Take 1 tablet by mouth daily.    [provider]  nitroGLYCERIN  (NITROSTAT ) 0.4 MG SL tablet Place 1 tablet (0.4 mg total) under the tongue every 5 (five) minutes as needed for chest pain. 10/25/22   Revankar, Rajan R, MD  omega-3 acid ethyl esters (LOVAZA ) 1 g capsule TAKE 2 CAPSULES BY MOUTH 2 TIMES DAILY. 11/01/23   Revankar, Rajan R, MD  pyridoxine (B-6) 200 MG tablet Take 200 mg by mouth daily.    [provider]  rosuvastatin  (CRESTOR ) 40 MG tablet TAKE 1 TABLET BY MOUTH DAILY. 10/14/23   Revankar, Jennifer SAUNDERS, MD  sildenafil (REVATIO) 20 MG tablet Take 20 mg by mouth as needed for erectile dysfunction. 08/02/19   [provider]  tamsulosin (FLOMAX) 0.4 MG CAPS capsule Take 0.4 mg by mouth 2 (two) times daily. 05/30/21   [provider]  telmisartan  (MICARDIS ) 80 MG tablet TAKE 1 TABLET BY MOUTH DAILY. 09/10/23   Revankar, Rajan R, MD  Tetrahydrozoline-Zn Sulfate (EYE DROPS RELIEF OP) Place 1 drop into both eyes 2 (two) times daily.    [provider]  triamcinolone (NASACORT) 55 MCG/ACT AERO nasal inhaler Place 2 sprays into the nose as needed (allergies or rhinitis).    [provider]  vitamin B-12 (CYANOCOBALAMIN) 1000 MCG tablet Take 1,000 mcg by mouth daily.    [provider]    Physical Exam    Vital Signs:  Clarence Schneider does not have vital signs available for review today.  Given telephonic nature of communication, physical exam is limited. AAOx3. NAD. Normal affect.  Speech and respirations are unlabored.  Accessory Clinical Findings    None  Assessment & Plan    1.  Preoperative Cardiovascular Risk Assessment:  According to the Revised Cardiac Risk Index (RCRI), his Perioperative Risk of Major Cardiac Event is (%): 0.9. His Functional Capacity in METs is: 8.97 according to the [facility] Activity  Status Index (DASI).Therefore, based on ACC/AHA guidelines, patient would be at acceptable risk for the planned procedure without further cardiovascular testing.   The patient was advised that if he develops new symptoms prior to surgery to contact our office to arrange for a follow-up visit, and he verbalized understanding.  Per office protocol, she may hold Plavix  for 5 days prior to procedure. Please resume Plavix  as soon as possible postprocedure, at the discretion of the surgeon. Regarding ASA therapy, we recommend continuation of ASA throughout the perioperative period.  However, if the surgeon feels that cessation of ASA is required in the perioperative period, it may be stopped 5-7 days prior to surgery with a plan to resume it as soon as felt to be feasible from a surgical standpoint in the post-operative period.   A copy of this note will be routed to requesting surgeon.  Time:   Today, I have spent 5 minutes with the patient with telehealth technology discussing medical history, symptoms, and management plan.     Damien JAYSON Braver, NP  11/29/2023, 9:16 AM

## 2023-12-05 ENCOUNTER — Other Ambulatory Visit: Payer: Self-pay | Admitting: Cardiology

## 2024-03-30 ENCOUNTER — Other Ambulatory Visit: Payer: Self-pay | Admitting: Cardiology

## 2024-04-30 ENCOUNTER — Ambulatory Visit: Attending: Cardiology | Admitting: Cardiology

## 2024-04-30 ENCOUNTER — Encounter: Payer: Self-pay | Admitting: Cardiology

## 2024-04-30 VITALS — BP 138/80 | HR 43 | Ht 71.0 in | Wt 213.0 lb

## 2024-04-30 DIAGNOSIS — E782 Mixed hyperlipidemia: Secondary | ICD-10-CM

## 2024-04-30 DIAGNOSIS — Z9861 Coronary angioplasty status: Secondary | ICD-10-CM

## 2024-04-30 DIAGNOSIS — G4733 Obstructive sleep apnea (adult) (pediatric): Secondary | ICD-10-CM | POA: Diagnosis not present

## 2024-04-30 DIAGNOSIS — I4729 Other ventricular tachycardia: Secondary | ICD-10-CM | POA: Diagnosis not present

## 2024-04-30 DIAGNOSIS — I2510 Atherosclerotic heart disease of native coronary artery without angina pectoris: Secondary | ICD-10-CM | POA: Diagnosis not present

## 2024-04-30 DIAGNOSIS — I10 Essential (primary) hypertension: Secondary | ICD-10-CM

## 2024-04-30 NOTE — Patient Instructions (Signed)

## 2024-04-30 NOTE — Progress Notes (Signed)
 " Cardiology Office Note:    Date:  04/30/2024   ID:  Clarence Schneider, DOB 1951-02-11, MRN [id number]  PCP:  [name], MD  Cardiologist:  Jennifer JONELLE Crape, MD   Referring MD: [name], MD    ASSESSMENT:    1. Mixed dyslipidemia   2. Benign essential hypertension   3. CAD S/P percutaneous coronary angioplasty   4. Coronary artery disease involving native coronary artery of native heart without angina pectoris   5. Nonsustained ventricular tachycardia (HCC)   6. OSA treated with BiPAP   7. Mixed hyperlipidemia    PLAN:    In order of problems listed above:  Coronary artery disease: Secondary prevention stressed with the patient.  Importance of compliance with diet medication stressed and patient verbalized standing.  He was advised to walk at least half an hour a day on a daily basis Essential hypertension: Blood pressure stable and diet was emphasized.  Lifestyle modification urged.  I am not keen on lowering his blood pressure aggressively because of fear of hypotension.  He has dizzy spells which have been evaluated well in the past and we have not put her finger on any cardiovascular etiology. Mixed dyslipidemia: On lipid-lowering medications followed by primary care.  Lipids are at goal. Patient will be seen in follow-up appointment in 6 months or earlier if the patient has any concerns.    Medication Adjustments/Labs and Tests Ordered: Current medicines are reviewed at length with the patient today.  Concerns regarding medicines are outlined above.  Orders Placed This Encounter  Procedures   EKG 12-Lead   No orders of the defined types were placed in this encounter.    No chief complaint on file.    History of Present Illness:    Clarence Schneider is a 74 y.o. male.  Patient has past medical history of coronary artery disease, essential hypertension, mixed dyslipidemia.  He denies any problems at this time and takes care of activities of daily living.   No chest pain orthopnea or PND.  At the time of my evaluation, the patient is alert awake oriented and in no distress.  He exercises on a regular basis.  Past Medical History:  Diagnosis Date   Adenomatous polyp of colon 03/20/2016   Overview:   2007     Formatting of this note might be different from the original.  2007     Allergic rhinitis    Anticoagulant long-term use    plavix / asa 81mg --- managed by cardiology   Arthritis 02/16/2020   Arthritis of right knee 03/26/2013   Back strain 07/05/2017   2019: right     Benign essential hypertension 12/27/2014   Formatting of this note might be different from the original.  1997: dx  2002: rx     Benign localized prostatic hyperplasia with lower urinary tract symptoms (LUTS)    Bladder disorder 05/21/2016   CAD S/P percutaneous coronary angioplasty 06/21/2017   Chronic frontal sinusitis 10/23/2017   2019     Formatting of this note might be different from the original.  2019     Chronic lumbar pain 03/19/2016   Chronic pain of both knees 07/12/2016   Chronic right shoulder pain 07/12/2016   Coronary artery disease    stents     Coronary artery disease involving native coronary artery of native heart 03/19/2016   cardiologist---  dr elfredia;    onset cath  2005: cath diffuse disease, dissection/occlusion RCA, stent x3;  abn NUC 06-03-2017 , Cath 06-21-2017  balloon angioplasty focal area restenosis distal RCA;   stress echo 06-27-2018  no evidence ischemia   DDD (degenerative disc disease), cervical 07/18/2021   DDD (degenerative disc disease), lumbar 07/18/2021   Dizziness 12/27/2014   ED (erectile dysfunction)    Enlarged prostate with lower urinary tract symptoms (LUTS) 01/08/2022   Formatting of this note might be different from the original. 06/2021: cysto Formatting of this note might be different from the original. 06/2021: cysto     Environmental allergies    Facet arthropathy, cervical 07/18/2021   Facet arthropathy, lumbar  07/18/2021   Gastroesophageal reflux disease 02/16/2020   Glucosuria    Herpes zoster without complication 10/05/2019   Formatting of this note might be different from the original.  2021: right T7, no rash     History of adenomatous polyp of colon    History of GI bleed 01/07/2018   Formatting of this note might be different from the original. 2019: melena, GI eval, continue ASA   History of trigeminal neuralgia    Hyperlipemia    Hypertension    Hypotestosteronism 05/27/2015   Impingement syndrome of left shoulder 06/03/2014   Impotence 05/26/2015   Intolerance of continuous positive airway pressure (CPAP) ventilation 04/18/2021   Lateral epicondylitis of right elbow 01/04/2016   Overview:   2017     Formatting of this note might be different from the original.  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 1998: LDL 187 TG 317   Neck pain 07/18/2021   Neuromuscular disorder (HCC) 02/16/2020   Takes Gabapentin   Formatting of this note might be different from the original.  Formatting of this note might be different from the original.  Takes Gabapentin      Nonspecific dizziness    04-02-2023  per pt if stands to fast   Nonsustained ventricular tachycardia (HCC) 06/14/2017   noted on event moniotr 06-03-2017 asymptomative by patient , evaluated by EP and had 30 day event monitor 07-19-2017 unremarkable   OA (osteoarthritis)    Obstructive sleep apnea 03/19/2016   Overview:  2012: dx, AHI 69 with desaturation, BiPAP  Formatting of this note might be different from the original. Split-night sleep study 10/25/2017, AHI 63.8, CPAP 20     OSA  treated with BiPAP 03/19/2016   Overview:   2012: dx, AHI 69 with desaturation, BiPAP     Formatting of this note might be different from the original.  Split-night sleep study 10/25/2017, AHI 63.8, CPAP 20     Formatting of this note might be different from the original.  Titration 01/13/2020 BiPAP 25/20  Split-night sleep study 10/25/2017, AHI 63.8, CPAP 20     Peripheral arterial disease 03/19/2016   Pinched nerve    in back     Pre-diabetes 06/18/2016   S/P arthroscopy of right shoulder 06/28/2014   S/P placement of hypoglossal nerve stimulator 09/13/2021   @ [facility] ;   INSPIRE Apnea stimulator placement   S/P total knee arthroplasty 05/11/2013   S/P total knee replacement using cement 11/30/2013   Screening for prostate cancer 03/19/2016  Sebaceous cyst 05/26/2019   Formatting of this note might be different from the original.  2021: post neck excision     Shortness of breath    with exertion     Sinusitis 09/02/2015   Overview:   2018:      Overview:   2018: eth     Sinusitis, acute ethmoidal 09/02/2015   Formatting of this note might be different from the original.  2018: eth  03/28/2020     Skin abrasion    under both arms; uses Lotrisone cream twice a day     Sleep apnea    cpap doesnt use reg     Slow heart rate 06/14/2017   Stage 3a chronic kidney disease (HCC) 03/28/2020   Subacromial bursitis of right shoulder joint 01/04/2016   Overview:   2017     Formatting of this note might be different from the original.  2017     Trigeminal neuralgia 03/19/2016   Trigger ring finger of left hand 05/29/2022   Formatting of this note might be different from the original. 05/29/2022 Formatting of this note might be different from the original. 05/29/2022   Type 2 diabetes mellitus (HCC)    followed by pcp   (04-02-2023  does not check blood sugar,  actually did not know he takes faxiga for diabetes)   Type 2 diabetes mellitus without complication, without long-term current use of  insulin (HCC) 06/18/2016   Formatting of this note might be different from the original.  06/23/2020 : 132/6.9     Verrucous skin lesion 03/28/2020   Formatting of this note might be different from the original.  03/28/2020 : right deltoid region     Wears glasses     Past Surgical History:  Procedure Laterality Date   APPENDECTOMY  1968   CARPAL TUNNEL RELEASE Bilateral    left x2  1995 & 2001;   right 1997   CATARACT EXTRACTION W/ INTRAOCULAR LENS IMPLANT Bilateral 2018   COLONOSCOPY WITH ESOPHAGOGASTRODUODENOSCOPY (EGD)  2019   CORONARY ANGIOPLASTY WITH STENT PLACEMENT  1995   per pt @ [facility] 1995  x2  BMS for RCA dissection/ occlusion   CORONARY ANGIOPLASTY WITH STENT PLACEMENT  1996   @ [facility];   for chest pain;   x1 BMS to RCA   CORONARY BALLOON ANGIOPLASTY N/A 06/21/2017   Procedure: CORONARY BALLOON ANGIOPLASTY;  Surgeon: Dann Candyce RAMAN, MD;  Location: [facility] INVASIVE CV LAB;  Service: Cardiovascular;  Laterality: N/A;   CYSTOSCOPY  04/04/2023   Procedure: CYSTOSCOPY FLEXIBLE;  Surgeon: Marda General, MD;  Location: [facility]  Service: Urology;;   HEMORRHOID SURGERY  2017   IMPLANTATION OF HYPOGLOSSAL NERVE STIMULATOR  09/13/2021   @[facility]  by dr h. patwa;  INSPIRE Apnea stimupator placment   INGUINAL HERNIA REPAIR Right    1990s   LEFT HEART CATH AND CORONARY ANGIOGRAPHY N/A 06/21/2017   Procedure: LEFT HEART CATH AND CORONARY ANGIOGRAPHY;  Surgeon: Dann Candyce RAMAN, MD;  Location[facility] INVASIVE CV LAB;  Service: Cardiovascular;  Laterality: N/A;   RADIOACTIVE SEED IMPLANT N/A 04/04/2023   Procedure: RADIOACTIVE SEED IMPLANT/BRACHYTHERAPY IMPLANT;  Surgeon: Marda General, MD;  Location: [facility];  Service: Urology;  Laterality: N/A;   SHOULDER ARTHROSCOPY Bilateral    left approx 2004 ;   right 2005   TONSILLECTOMY AND ADENOIDECTOMY  1972   TOTAL KNEE ARTHROPLASTY Left 05/11/2013   Procedure: LEFT TOTAL KNEE ARTHROPLASTY;  Surgeon:  Marcey Raman, MD;  Location: [facility] OR;  Service: Orthopedics;  Laterality: Left;   TOTAL KNEE ARTHROPLASTY Right 11/30/2013   Procedure: TOTAL KNEE ARTHROPLASTY;  Surgeon: Marcey Raman, MD;  Location: [facility] OR;  Service: Orthopedics;  Laterality: Right;   ULNAR NERVE TRANSPOSITION Bilateral 2002   approx    Current Medications: Active Medications[1]   Allergies:   Oxycontin  [oxycodone  hcl], Metoclopramide , Oxycodone , and Tape   Social History   Socioeconomic History   Marital status: Married    Spouse name: Not on file   Number of children: Not on file   Years of education: Not on file   Highest education level: Not on file  Occupational History   Not on file  Tobacco Use   Smoking status: Never   Smokeless tobacco: Never  Vaping Use   Vaping status: Never Used  Substance and Sexual Activity   Alcohol use: Yes    Alcohol/week: 1.0 standard drink of alcohol    Types: 1 Glasses of wine per week   Drug use: No    Comment: per pt last used 1970s   Sexual activity: Not on file  Other Topics Concern   Not on file  Social History Narrative   Not on file   Social Drivers of Health   Tobacco Use: Low Risk (04/30/2024)   Patient History    Smoking Tobacco Use: Never    Smokeless Tobacco Use: Never    Passive Exposure: Not on file  Financial Resource Strain: Not on file  Food Insecurity: Low Risk (12/17/2023)   Received from [facility]   Epic    Within the past 12 months, you worried that your food would run out before you got money to buy more: Never true    Within the past 12 months, the food you bought just didn't last and you didn't have money to get more. : Never true  Transportation Needs: No Transportation Needs (12/17/2023)   Received from [facility]    In the past 12 months, has lack of reliable transportation kept you from medical appointments, meetings, work or from getting things needed for daily living? : No  Physical Activity: Not on file  Stress:  Not on file  Social Connections: Not on file  Depression (PHQ2-9): Low Risk (01/29/2023)   Depression (PHQ2-9)    PHQ-2 Score: 0  Alcohol Screen: Low Risk (01/29/2023)   Alcohol Screen    Last Alcohol Screening Score (AUDIT): 0  Housing: Low Risk (12/17/2023)   Received from [facility]   Epic    What is your living situation today?: I have a steady place to live    Think about the place you live. Do you have problems with any of the following? Choose all that apply:: None/None on this list  Utilities: Low Risk (12/17/2023)   Received from [facility]   Utilities    In the past 12 months has the electric, gas, oil, or water  company threatened to shut off services in your home? : No  Health Literacy: Not on file     Family History: The patient's family history includes COPD in his brother; Cancer in his brother and father; Diabetes in his father; Heart attack in his brother; Hyperlipidemia in his father; Hypertension in his father and mother; Prostate cancer in his brother.  ROS:   Please see the history of present illness.    All other systems reviewed and are negative.  EKGs/Labs/Other Studies Reviewed:    The following studies were reviewed  today: SABRASABRAEKG Interpretation Date/Time:  Thursday April 30 2024 08:06:09 EST Ventricular Rate:  43 PR Interval:  190 QRS Duration:  80 QT Interval:  474 QTC Calculation: 400 R Axis:   18  Text Interpretation: Marked sinus bradycardia Nonspecific T wave abnormality Abnormal ECG When compared with ECG of 21-Jun-2017 12:50, T wave inversion now evident in Inferior leads Inverted T waves have replaced nonspecific T wave abnormality in Lateral leads Confirmed by Edwyna Backers [phone]) on 04/30/2024 8:16:46 AM     Recent Labs: 07/29/2023: ALT 35; BUN 16; Creatinine, Ser 1.24; Hemoglobin 13.1; Platelets 198; Potassium 4.0; Sodium 141; TSH 2.070  Recent Lipid Panel    Component Value Date/Time   CHOL 145 07/29/2023 0844   TRIG 96  07/29/2023 0844   HDL 75 07/29/2023 0844   CHOLHDL 1.9 07/29/2023 0844   LDLCALC 52 07/29/2023 0844    Physical Exam:    VS:  BP (!) 152/88   Pulse (!) 43   Ht 5' 11 (1.803 m)   Wt 213 lb (96.6 kg)   SpO2 99%   BMI 29.71 kg/m     Wt Readings from Last 3 Encounters:  04/30/24 213 lb (96.6 kg)  07/29/23 210 lb 12.8 oz (95.6 kg)  04/25/23 206 lb (93.4 kg)     GEN: Patient is in no acute distress HEENT: Normal NECK: No JVD; No carotid bruits LYMPHATICS: No lymphadenopathy CARDIAC: Hear sounds regular, 2/6 systolic murmur at the apex. RESPIRATORY:  Clear to auscultation without rales, wheezing or rhonchi  ABDOMEN: Soft, non-tender, non-distended MUSCULOSKELETAL:  No edema; No deformity  SKIN: Warm and dry NEUROLOGIC:  Alert and oriented x 3 PSYCHIATRIC:  Normal affect   Signed, Backers JONELLE Edwyna, MD  04/30/2024 8:31 AM    [facility]     [1]  Current Meds  Medication Sig   ascorbic acid (VITAMIN C) 1000 MG tablet Take 1,000 mg by mouth daily.   aspirin  81 MG EC tablet Take 81 mg by mouth daily.    carvedilol  (COREG ) 12.5 MG tablet Take 1 tablet (12.5 mg total) by mouth 2 (two) times daily.   Cholecalciferol (D3 2000) 50 MCG (2000 UT) CAPS Take 2,000 Units by mouth daily.   clopidogrel  (PLAVIX ) 75 MG tablet TAKE 1 TABLET BY MOUTH DAILY.   ezetimibe  (ZETIA ) 10 MG tablet TAKE 1 TABLET BY MOUTH DAILY.   FARXIGA 10 MG TABS tablet Take 10 mg by mouth daily.   fenofibrate  160 MG tablet TAKE 1 TABLET BY MOUTH EVERY EVENING.   fexofenadine (ALLEGRA) 180 MG tablet Take 180 mg by mouth daily.   gabapentin  (NEURONTIN ) 300 MG capsule Take 300 mg by mouth 3 (three) times daily as needed for pain.   hydrochlorothiazide  (MICROZIDE ) 12.5 MG capsule Take 1 capsule (12.5 mg total) by mouth daily.   montelukast (SINGULAIR) 10 MG tablet Take 10 mg by mouth at bedtime.   Multiple Vitamins-Minerals (MULTIVITAMIN WITH MINERALS) tablet Take 1 tablet by mouth daily.    nitroGLYCERIN  (NITROSTAT ) 0.4 MG SL tablet Place 1 tablet (0.4 mg total) under the tongue every 5 (five) minutes as needed for chest pain.   omega-3 acid ethyl esters (LOVAZA ) 1 g capsule TAKE 2 CAPSULES BY MOUTH 2 TIMES DAILY.   pyridoxine (B-6) 200 MG tablet Take 200 mg by mouth daily.   rosuvastatin  (CRESTOR ) 40 MG tablet TAKE 1 TABLET BY MOUTH DAILY.   sildenafil (REVATIO) 20 MG tablet Take 20 mg by mouth as needed for erectile dysfunction.  tamsulosin (FLOMAX) 0.4 MG CAPS capsule Take 0.4 mg by mouth 2 (two) times daily.   telmisartan  (MICARDIS ) 80 MG tablet TAKE 1 TABLET BY MOUTH DAILY.   Tetrahydrozoline-Zn Sulfate (EYE DROPS RELIEF OP) Place 1 drop into both eyes 2 (two) times daily.   triamcinolone (NASACORT) 55 MCG/ACT AERO nasal inhaler Place 2 sprays into the nose as needed (allergies or rhinitis).   vitamin B-12 (CYANOCOBALAMIN) 1000 MCG tablet Take 1,000 mcg by mouth daily.   [DISCONTINUED] cycloSPORINE (RESTASIS) 0.05 % ophthalmic emulsion Place 1 drop into both eyes daily.   "
